# Patient Record
Sex: Male | Born: 1955 | Race: White | Hispanic: No | Marital: Married | Smoking: Former smoker
Health system: Southern US, Community
[De-identification: ages and names within clinical notes are randomized; demographics above are authoritative.]

## PROBLEM LIST (undated history)

## (undated) DIAGNOSIS — R05 Cough: Secondary | ICD-10-CM

## (undated) DIAGNOSIS — G8929 Other chronic pain: Secondary | ICD-10-CM

## (undated) DIAGNOSIS — C61 Malignant neoplasm of prostate: Secondary | ICD-10-CM

## (undated) DIAGNOSIS — C801 Malignant (primary) neoplasm, unspecified: Secondary | ICD-10-CM

## (undated) DIAGNOSIS — E7800 Pure hypercholesterolemia, unspecified: Secondary | ICD-10-CM

## (undated) DIAGNOSIS — R000 Tachycardia, unspecified: Secondary | ICD-10-CM

## (undated) DIAGNOSIS — J111 Influenza due to unidentified influenza virus with other respiratory manifestations: Secondary | ICD-10-CM

## (undated) DIAGNOSIS — N393 Stress incontinence (female) (male): Secondary | ICD-10-CM

## (undated) DIAGNOSIS — J189 Pneumonia, unspecified organism: Secondary | ICD-10-CM

## (undated) HISTORY — PX: PROSTATE SURGERY: SHX751

## (undated) HISTORY — DX: Stress incontinence (female) (male): N39.3

## (undated) HISTORY — DX: Tachycardia, unspecified: R00.0

## (undated) HISTORY — DX: Malignant neoplasm of prostate: C61

## (undated) HISTORY — PX: TONSILLECTOMY: SUR1361

## (undated) HISTORY — DX: Cough: R05

## (undated) HISTORY — PX: ABDOMINAL SURGERY: SHX537

---

## 2014-02-21 DIAGNOSIS — C61 Malignant neoplasm of prostate: Secondary | ICD-10-CM

## 2014-02-21 DIAGNOSIS — Z8546 Personal history of malignant neoplasm of prostate: Secondary | ICD-10-CM | POA: Insufficient documentation

## 2014-02-21 HISTORY — DX: Malignant neoplasm of prostate: C61

## 2014-03-18 DIAGNOSIS — Z9079 Acquired absence of other genital organ(s): Secondary | ICD-10-CM | POA: Insufficient documentation

## 2014-04-10 DIAGNOSIS — N529 Male erectile dysfunction, unspecified: Secondary | ICD-10-CM | POA: Insufficient documentation

## 2014-04-10 DIAGNOSIS — N393 Stress incontinence (female) (male): Secondary | ICD-10-CM

## 2014-04-10 HISTORY — DX: Stress incontinence (female) (male): N39.3

## 2017-09-13 DIAGNOSIS — R000 Tachycardia, unspecified: Secondary | ICD-10-CM

## 2017-09-13 DIAGNOSIS — T887XXA Unspecified adverse effect of drug or medicament, initial encounter: Secondary | ICD-10-CM | POA: Insufficient documentation

## 2017-09-13 HISTORY — DX: Tachycardia, unspecified: R00.0

## 2017-09-19 DIAGNOSIS — R053 Chronic cough: Secondary | ICD-10-CM

## 2017-09-19 DIAGNOSIS — R05 Cough: Secondary | ICD-10-CM | POA: Insufficient documentation

## 2017-09-19 DIAGNOSIS — E782 Mixed hyperlipidemia: Secondary | ICD-10-CM | POA: Insufficient documentation

## 2017-09-19 HISTORY — DX: Chronic cough: R05.3

## 2018-04-05 ENCOUNTER — Encounter: Payer: Self-pay | Admitting: Emergency Medicine

## 2018-04-05 ENCOUNTER — Other Ambulatory Visit: Payer: Self-pay

## 2018-04-05 ENCOUNTER — Emergency Department (INDEPENDENT_AMBULATORY_CARE_PROVIDER_SITE_OTHER)
Admission: EM | Admit: 2018-04-05 | Discharge: 2018-04-05 | Disposition: A | Discharge status: Home / Self Care | Payer: Managed Care, Other (non HMO) | Source: Home / Self Care

## 2018-04-05 DIAGNOSIS — R1033 Periumbilical pain: Secondary | ICD-10-CM

## 2018-04-05 DIAGNOSIS — R0981 Nasal congestion: Secondary | ICD-10-CM

## 2018-04-05 DIAGNOSIS — R197 Diarrhea, unspecified: Secondary | ICD-10-CM | POA: Diagnosis not present

## 2018-04-05 DIAGNOSIS — R1013 Epigastric pain: Secondary | ICD-10-CM | POA: Diagnosis not present

## 2018-04-05 DIAGNOSIS — R1032 Left lower quadrant pain: Secondary | ICD-10-CM | POA: Diagnosis not present

## 2018-04-05 DIAGNOSIS — M545 Low back pain, unspecified: Secondary | ICD-10-CM

## 2018-04-05 HISTORY — DX: Malignant (primary) neoplasm, unspecified: C80.1

## 2018-04-05 HISTORY — DX: Pneumonia, unspecified organism: J18.9

## 2018-04-05 HISTORY — DX: Influenza due to unidentified influenza virus with other respiratory manifestations: J11.1

## 2018-04-05 LAB — POCT CBC W AUTO DIFF (K'VILLE URGENT CARE)

## 2018-04-05 MED ORDER — MELOXICAM 7.5 MG PO TABS: 7.5000 mg | ORAL_TABLET | Freq: Every day | ORAL | 0 refills | Status: DC

## 2018-04-05 MED ORDER — METRONIDAZOLE 500 MG PO TABS: 500.0000 mg | ORAL_TABLET | Freq: Three times a day (TID) | ORAL | 0 refills | Status: DC

## 2018-04-05 MED ORDER — CIPROFLOXACIN HCL 500 MG PO TABS: 500.0000 mg | ORAL_TABLET | Freq: Two times a day (BID) | ORAL | 0 refills | Status: DC

## 2018-04-05 NOTE — ED Triage Notes (Signed)
Patient has had cough for about 6 months; states xrays negative; 2 days ago began having congestion, fatigue, epigastric pain, diarrhea x 1, and raw throat.

## 2018-04-05 NOTE — ED Provider Notes (Signed)
[name]    CSN: [id number] Arrival date & time: 04/05/18  8182     History   Chief Complaint Chief Complaint  Patient presents with  . Nasal Congestion  . Abdominal Pain    HPI Harold Edwards is a 63 y.o. male.   HPI Harold Edwards is a 63 y.o. male presenting to UC with c/o intermittent cough for about 6 months with several negative chest x-rays.  Pt c/o 2 days of cough, congestion, fatigue, abdominal pain and diarrhea that started yesterday.  Abdominal pain is sharp and cramping from epigastric region to left lower abdomen. No prior hx of diverticulitis.  He did have a laparoscopic surgery in 2016/2017 for prostate cancer but also reports having an umbilical hernia repaired around the same time.  Wife and pt wonder if scar tissue could be causing the abdominal pain today.  Denies fever, chills, n/v/d. No hx of diverticulitis.    Past Medical History:  Diagnosis Date  . Cancer ([place])   . Influenza   . Pneumonia     There are no active problems to display for this patient.   Past Surgical History:  Procedure Laterality Date  . ABDOMINAL SURGERY    . PROSTATE SURGERY    . TONSILLECTOMY         Home Medications    Prior to Admission medications   Medication Sig Start Date End Date Taking? Authorizing Provider  ciprofloxacin (CIPRO) 500 MG tablet Take 1 tablet (500 mg total) by mouth 2 (two) times daily. One po bid x 7 days 04/05/18   Noe Gens, PA-C  meloxicam (MOBIC) 7.5 MG tablet Take 1-2 tablets (7.5-15 mg total) by mouth daily. 04/05/18   Noe Gens, PA-C  metroNIDAZOLE (FLAGYL) 500 MG tablet Take 1 tablet (500 mg total) by mouth 3 (three) times daily. 04/05/18   Noe Gens, PA-C    Family History No family history on file.  Social History Social History   Tobacco Use  . Smoking status: Former Research scientist (life sciences)  . Smokeless tobacco: Never Used  Substance Use Topics  . Alcohol use: Yes  . Drug use: Not on file     Allergies   Patient  has no known allergies.   Review of Systems Review of Systems  Constitutional: Positive for fatigue. Negative for chills and fever.  HENT: Positive for congestion and postnasal drip. Negative for ear pain, sore throat, trouble swallowing and voice change.   Respiratory: Positive for cough. Negative for shortness of breath.   Cardiovascular: Negative for chest pain and palpitations.  Gastrointestinal: Positive for abdominal pain and diarrhea. Negative for blood in stool, nausea and vomiting.  Genitourinary: Negative for dysuria, flank pain and hematuria.  Musculoskeletal: Positive for back pain (chronic, lower). Negative for arthralgias and myalgias.  Skin: Negative for rash.  Neurological: Negative for dizziness and headaches.     Physical Exam Triage Vital Signs ED Triage Vitals  Enc Vitals Group     BP 04/05/18 1027 108/75     Pulse Rate 04/05/18 1027 (!) 108     Resp 04/05/18 1027 18     Temp 04/05/18 1027 (!) 97.4 F (36.3 C)     Temp Source 04/05/18 1027 Oral     SpO2 04/05/18 1027 94 %     Weight 04/05/18 1029 210 lb (95.3 kg)     Height 04/05/18 1029 6\' 1"  (1.854 m)     Head Circumference --      Peak Flow --  Pain Score 04/05/18 1029 0     Pain Loc --      Pain Edu? --      Excl. in [place]? --    No data found.  Updated Vital Signs BP 108/75 (BP Location: Right Arm)   Pulse (!) 108   Temp (!) 97.4 F (36.3 C) (Oral)   Resp 18   Ht 6\' 1"  (1.854 m)   Wt 210 lb (95.3 kg)   SpO2 94%   BMI 27.71 kg/m  *SpO2 on RA was 96% checked during exam by provider.   Visual Acuity Right Eye Distance:   Left Eye Distance:   Bilateral Distance:    Right Eye Near:   Left Eye Near:    Bilateral Near:     Physical Exam Vitals signs and nursing note reviewed.  Constitutional:      Appearance: He is well-developed.  HENT:     Head: Normocephalic and atraumatic.     Right Ear: Tympanic membrane normal.     Left Ear: Tympanic membrane normal.     Nose: Congestion  present.     Right Sinus: No maxillary sinus tenderness or frontal sinus tenderness.     Left Sinus: No maxillary sinus tenderness or frontal sinus tenderness.     Mouth/Throat:     Lips: Pink.     Mouth: Mucous membranes are moist.     Pharynx: Oropharynx is clear. Uvula midline.  Neck:     Musculoskeletal: Normal range of motion.  Cardiovascular:     Rate and Rhythm: Normal rate and regular rhythm.  Pulmonary:     Effort: Pulmonary effort is normal.     Breath sounds: Normal breath sounds.  Abdominal:     General: Abdomen is flat. There is no distension.     Palpations: Abdomen is soft.     Tenderness: There is abdominal tenderness in the epigastric area, periumbilical area, suprapubic area and left lower quadrant. There is no right CVA tenderness, left CVA tenderness, guarding or rebound.  Musculoskeletal: Normal range of motion.     Lumbar back: He exhibits tenderness ( bilateral lower lumbar muscles). He exhibits no bony tenderness.  Skin:    General: Skin is warm and dry.  Neurological:     Mental Status: He is alert and oriented to person, place, and time.  Psychiatric:        Behavior: Behavior normal.      UC Treatments / Results  Labs (all labs ordered are listed, but only abnormal results are displayed) Labs Reviewed  COMPLETE METABOLIC PANEL WITH GFR  LIPASE  POCT CBC W AUTO DIFF ([place])    EKG None  Radiology No results found.  Procedures Procedures (including critical care time)  Medications Ordered in UC Medications - No data to display  Initial Impression / Assessment and Plan / UC Course  I have reviewed the triage vital signs and the nursing notes.  Pertinent labs & imaging results that were available during my care of the patient were reviewed by me and considered in my medical decision making (see chart for details).    URI symptoms likely viral, lungs: CTAB, normal TMs no sinus tenderness on exam  Back pain- chronic,  exacerbated by recent cough? Part of viral illness Abd: tenderness to Left side including LLQ of abdomen w/o evidence of acute abdomen Discussed with pt possibility of diverticulitis. Pt declined CT scan Will tx empirically with cipro and flagyl Encouraged f/u with PCP  AVS provided  Final Clinical Impressions(s) / UC Diagnoses   Final diagnoses:  Epigastric pain  Periumbilical pain  LLQ abdominal pain  Diarrhea, unspecified type  Nasal congestion  Acute bilateral low back pain without sciatica     Discharge Instructions      Meloxicam (Mobic) is an antiinflammatory to help with pain and inflammation.  Do not take ibuprofen, Advil, Aleve, or any other medications that contain NSAIDs while taking meloxicam as this may cause stomach upset or even ulcers if taken in large amounts for an extended period of time.   Please take antibiotics as prescribed and be sure to complete entire course even if you start to feel better to ensure infection does not come back.  Please call to schedule an appointment with a primary care provider who can recheck your symptoms. If you do have diverticulitis, it is recommended you have a follow up colonoscopy to ensure no cancerous growths or other complications in your lower intestines.  Call 911 or go to the hospital if symptoms worsening.     ED Prescriptions    Medication Sig Dispense Auth. Provider   meloxicam (MOBIC) 7.5 MG tablet Take 1-2 tablets (7.5-15 mg total) by mouth daily. 14 tablet Gerarda Fraction, Joban Colledge O, PA-C   ciprofloxacin (CIPRO) 500 MG tablet Take 1 tablet (500 mg total) by mouth 2 (two) times daily. One po bid x 7 days 14 tablet Alexcis Bicking O, PA-C   metroNIDAZOLE (FLAGYL) 500 MG tablet Take 1 tablet (500 mg total) by mouth 3 (three) times daily. 14 tablet Noe Gens, [place]     Controlled Substance Prescriptions [place] Controlled Substance Registry consulted? Not Applicable   Tyrell Antonio 04/05/18 1202

## 2018-04-05 NOTE — Discharge Instructions (Signed)
°  Meloxicam (Mobic) is an antiinflammatory to help with pain and inflammation.  Do not take ibuprofen, Advil, Aleve, or any other medications that contain NSAIDs while taking meloxicam as this may cause stomach upset or even ulcers if taken in large amounts for an extended period of time.   Please take antibiotics as prescribed and be sure to complete entire course even if you start to feel better to ensure infection does not come back.  Please call to schedule an appointment with a primary care provider who can recheck your symptoms. If you do have diverticulitis, it is recommended you have a follow up colonoscopy to ensure no cancerous growths or other complications in your lower intestines.  Call 911 or go to the hospital if symptoms worsening.

## 2018-04-14 ENCOUNTER — Encounter: Payer: Self-pay | Admitting: Physician Assistant

## 2018-04-14 ENCOUNTER — Ambulatory Visit (INDEPENDENT_AMBULATORY_CARE_PROVIDER_SITE_OTHER): Payer: Managed Care, Other (non HMO) | Admitting: Physician Assistant

## 2018-04-14 VITALS — BP 134/85 | HR 105 | Ht 73.0 in | Wt 214.0 lb

## 2018-04-14 DIAGNOSIS — E789 Disorder of lipoprotein metabolism, unspecified: Secondary | ICD-10-CM

## 2018-04-14 DIAGNOSIS — R05 Cough: Secondary | ICD-10-CM

## 2018-04-14 DIAGNOSIS — Z7689 Persons encountering health services in other specified circumstances: Secondary | ICD-10-CM

## 2018-04-14 DIAGNOSIS — Z8546 Personal history of malignant neoplasm of prostate: Secondary | ICD-10-CM | POA: Diagnosis not present

## 2018-04-14 DIAGNOSIS — R053 Chronic cough: Secondary | ICD-10-CM

## 2018-04-14 DIAGNOSIS — Z87891 Personal history of nicotine dependence: Secondary | ICD-10-CM

## 2018-04-14 DIAGNOSIS — M5416 Radiculopathy, lumbar region: Secondary | ICD-10-CM

## 2018-04-14 DIAGNOSIS — R000 Tachycardia, unspecified: Secondary | ICD-10-CM

## 2018-04-14 MED ORDER — METOPROLOL SUCCINATE ER 50 MG PO TB24: 50.0000 mg | ORAL_TABLET | Freq: Every day | ORAL | 3 refills | Status: DC

## 2018-04-14 NOTE — Progress Notes (Addendum)
HPI:                                                                Harold Edwards is a 63 y.o. male who presents to [facility] today to establish care   Current Concerns include:   Cough: has had a persistent non-productive cough for approx 8 months. He has been seen 5 times by various family and urgent care providers for this problem. He has been treated with multiple rounds of antibiotics and cough suppressants without improvement of his symptoms. No known triggers. Denies fever, chills, unintended weight loss and nightsweats. Denies history of asthma, wheezing, dyspnea. Endorses nasal congestion. Reports last year he was having allergy symptoms in the spring, but typically doesn't have seasonal allergies. No prior sinus surgery. Former smoker, quit 1990, approx 13 packyears. Personal history of prostate cancer s/p prostatectomy 05/2014 Does not vape He has a remote history of pneumonia He had a CXR 11/09/2017 which showed basilar atelectasis.  Back pain: He has constant bilateral low back pain present for years. 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 Pain is worse with prolonged sitting. He has been having pain and tingling in his right great toe  for a couple of years, but feels like this has been worsening in the last few months. No weakness in his right leg or falls. He had one epidural in July 2018, he had temporary relief for about 1 week and then his symptoms recurred. He doesn't like to take medication, but he will take Meloxicam every one in awhile for severe pain, but it upsets his stomach. He was most recently prescribed Meloxicam 7.5 mg by urgent care on 04/05/18. Prior medications include Tramadol, Diclofenac, Prednisone, and Gabapentin. He did physical therapy in 2014, which was not helpful. He has tried an inversion table and chiropractic care, which was not  helpful Last imaging of the spine was a plain film dated 12/28/12 showing L5-L5 and L5-S1 disc space narrowing. No prior MRI  Sinus tachycardia: reports he has always been "hyper" and very active. Prior PCP documented sinus tachycardia by ECG. He denies chest pain, palpitations or irregular heart rate.  Health Maintenance Health Maintenance  Topic Date Due  . Hepatitis C Screening  19-Nov-1955  . HIV Screening  10/03/1971  . COLONOSCOPY  10/03/2006  . TETANUS/TDAP  04/13/2025  . INFLUENZA VACCINE  Completed    Past Medical History:  Diagnosis Date  . Cancer ([place])   . Chronic cough 09/19/2017  . Influenza   . Pneumonia   . Prostate cancer ([place]) 02/21/2014   4+4=8 s/p TRUS biopsy by Dr. Elnoria Howard on 01/18/14  Last Assessment & Plan:   Patient be managed by urology.  Patient is status post prostatectomy and radiation therapy.  Denies hematuria.  . Sinus tachycardia 09/13/2017  . Stress incontinence 04/10/2014   Past Surgical History:  Procedure Laterality Date  . ABDOMINAL SURGERY    . PROSTATE SURGERY    . TONSILLECTOMY     Social History   Tobacco Use  . Smoking status: Former Smoker    Packs/day: 1.00    Years: 13.00    Pack years: 13.00  Types: Cigarettes    Start date: 02/08/1974    Last attempt to quit: 10/02/1988    Years since quitting: 29.5  . Smokeless tobacco: Never Used  Substance Use Topics  . Alcohol use: Yes   family history includes Breast cancer in an other family member; Diabetes in an other family member.  Review of Systems  HENT: Positive for congestion.   Respiratory: Positive for cough.   Gastrointestinal: Positive for abdominal pain.  Musculoskeletal: Positive for back pain.  Neurological: Positive for weakness (right foot) and headaches (occipital).  All other systems reviewed and are negative.    Medications: Current Outpatient Medications  Medication Sig Dispense Refill  . metoprolol succinate (TOPROL-XL) 50 MG 24 hr tablet Take 1 tablet (50 mg  total) by mouth daily. Take with or immediately following a meal. 90 tablet 3   No current facility-administered medications for this visit.    Allergies  Allergen Reactions  . Amoxicillin Diarrhea    Stomach cramps       Objective:  BP 134/85   Pulse (!) 105   Ht 6\' 1"  (1.854 m)   Wt 214 lb (97.1 kg)   SpO2 98%   BMI 28.23 kg/m  Gen:  alert, not ill-appearing, no distress, appropriate for age 42: head normocephalic without obvious abnormality, conjunctiva and cornea clear, trachea midline Pulm: Normal work of breathing, normal phonation, clear to auscultation bilaterally, no wheezes, rales or rhonchi CV: mildly tachycardic rate, regular rhythm, s1 and s2 distinct, no murmurs, clicks or rubs  Neuro: alert and oriented x 3, no tremor MSK: extremities atraumatic, normal gait and station Skin: intact, no rashes on exposed skin, no jaundice, no cyanosis Psych: well-groomed, cooperative, good eye contact, euthymic mood, affect mood-congruent, speech is articulate, and thought processes clear and goal-directed   No results found for this or any previous visit (from the past 72 hour(s)). No results found.    Assessment and Plan: 63 y.o. 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 Take with or immediately following a meal.  Lumbar back pain with radiculopathy affecting right lower extremity -     MR Lumbar Spine Wo Contrast; Future  Former smoker, stopped smoking in distant past Comments: 1976-1990, 13 packyears    Chronic cough - no  constitutional or red flag symptoms. Pulse ox 98% on RA at rest. No Adventitious lung sounds. Remote smoking history,quit 1990, approx 13 packyears - CXR 11/2017 showing bibasilar atelectasis - DDx includes reactive airway disease, COPD, upper airway cough syndrome, and malignancy - due to smoking hx and personal hx of malignancy (prostate), will order CT chest w/ contrast to rule out malignancy  Sinus tachycardia - asymptomatic, ECG dated 09/19/17 showing sinus tachycardia at 118 bpm (unable to view ECG) - explained importance of treating sinus tach to prevent excess strain on the heart muscle and prevent arrhythmias - starting Metoprolol XR 50 mg  Chronic lumbar radiculopathy - right great toe numbness, gradually worsening. Has failed conservative measures including PT and NSAIDs. No prior advanced imaging. Will  order MR L-spine and refer to Sports Medicine  Patient education and anticipatory guidance given Patient agrees with treatment plan Follow-up in 1 month for HTN/tachycardia or sooner as needed  Darlyne Russian PA-C

## 2018-04-14 NOTE — Patient Instructions (Addendum)
For your blood pressure: - Goal <130/80 (Ideally 120's/70's), Heart rate goal <90 - take your Metoprolol daily at around the same time each day close to a meal - monitor and log blood pressures at home - check around the same time each day in a relaxed setting - Limit salt to <2500 mg/day - Follow DASH (Dietary Approach to Stopping Hypertension) eating plan - Try to get at least 150 minutes of aerobic exercise per week - Aim to go on a brisk walk 30 minutes per day at least 5 days per week. If you're not active, gradually increase how long you walk by 5 minutes each week - limit alcohol: 2 standard drinks per day for men and 1 per day for women - avoid tobacco/nicotine products. Consider smoking cessation if you smoke - weight loss: 7% of current body weight can reduce your blood pressure by 5-10 points - follow-up at least every 6 months for your blood pressure. Follow-up sooner if your BP is not controlled   Sinus Tachycardia  Sinus tachycardia is a kind of fast heartbeat. In sinus tachycardia, the heart beats more than 100 times a minute. Sinus tachycardia starts in a part of the heart called the sinus node. Sinus tachycardia may be harmless, or it may be a sign of a serious condition. What are the causes? This condition may be caused by:  Exercise or exertion.  A fever.  Pain.  Loss of body fluids (dehydration).  Severe bleeding (hemorrhage).  Anxiety and stress.  Certain substances, including: ? Alcohol. ? Caffeine. ? Tobacco and nicotine products. ? Cold medicines. ? Illegal drugs.  Medical conditions including: ? Heart disease. ? An infection. ? An overactive thyroid (hyperthyroidism). ? A lack of red blood cells (anemia). What are the signs or symptoms? Symptoms of this condition include:  A feeling that the heart is beating quickly (palpitations).  Suddenly noticing your heartbeat (cardiac awareness).  Dizziness.  Tiredness (fatigue).  Shortness of  breath.  Chest pain.  Nausea.  Fainting. How is this diagnosed? This condition is diagnosed with:  A physical exam.  Other tests, such as: ? Blood tests. ? An electrocardiogram (ECG). This test measures the electrical activity of the heart. ? Ambulatory cardiac monitor. This records your heartbeats for 24 hours or more. You may be referred to a heart specialist (cardiologist). How is this treated? Treatment for this condition depends on the cause or the underlying condition. Treatment may involve:  Treating the underlying condition.  Taking new medicines or changing your current medicines as told by your health care provider.  Making changes to your diet or lifestyle. Follow these instructions at home: Lifestyle   Do not use any products that contain nicotine or tobacco, such as cigarettes and e-cigarettes. If you need help quitting, ask your health care provider.  Do not use illegal drugs, such as cocaine.  Learn relaxation methods to help you when you get stressed or anxious. These include deep breathing.  Avoid caffeine or other stimulants. Alcohol use   Do not drink alcohol if: ? Your health care provider tells you not to drink. ? You are pregnant, may be pregnant, or are planning to become pregnant.  If you drink alcohol, limit how much you have: ? 0-1 drink a day for women. ? 0-2 drinks a day for men.  Be aware of how much alcohol is in your drink. In the [place] one drink equals one typical bottle of beer (12 oz), one-half glass of wine (5 oz),  or one shot of hard liquor (1 oz). General instructions  Drink enough fluids to keep your urine pale yellow.  Take over-the-counter and prescription medicines only as told by your health care provider.  Keep all follow-up visits as told by your health care provider. This is important. Contact a health care provider if you have:  A fever.  Vomiting or diarrhea that does not go away. Get help right away if  you:  Have pain in your chest, upper arms, jaw, or neck.  Become weak or dizzy.  Feel faint.  Have palpitations that do not go away. Summary  In sinus tachycardia, the heart beats more than 100 times a minute.  Sinus tachycardia may be harmless, or it may be a sign of a serious condition.  Treatment for this condition depends on the cause or the underlying condition.  Get help right away if you have pain in your chest, upper arms, jaw, or neck. This information is not intended to replace advice given to you by your health care provider. Make sure you discuss any questions you have with your health care provider. Document Released: 03/04/2004 Document Revised: 03/16/2017 Document Reviewed: 03/16/2017 Elsevier Interactive Patient Education  2019 [employer].

## 2018-04-20 ENCOUNTER — Telehealth: Payer: Self-pay | Admitting: Physician Assistant

## 2018-04-20 ENCOUNTER — Encounter: Payer: Self-pay | Admitting: Physician Assistant

## 2018-04-20 DIAGNOSIS — Z87891 Personal history of nicotine dependence: Secondary | ICD-10-CM | POA: Insufficient documentation

## 2018-04-20 DIAGNOSIS — R05 Cough: Secondary | ICD-10-CM

## 2018-04-20 DIAGNOSIS — I10 Essential (primary) hypertension: Secondary | ICD-10-CM

## 2018-04-20 DIAGNOSIS — R053 Chronic cough: Secondary | ICD-10-CM

## 2018-04-20 NOTE — Telephone Encounter (Signed)
Regarding denial of CT chest w/o contrast, Case #[id number] Spoke with [employer] nurse clinical reviewer Kelby Aline Order was changed from CT without to CT with contrast Supporting documentation was faxed to [place] [phone]  Please contact patient regarding approval of his CT scan He will need to come in for labs to check his kidney function before receiving IV contrast

## 2018-04-20 NOTE — Telephone Encounter (Signed)
Recommendation left on vm -EH/RMA

## 2018-05-01 ENCOUNTER — Encounter: Payer: Self-pay | Admitting: Family Medicine

## 2018-05-01 ENCOUNTER — Other Ambulatory Visit: Payer: Self-pay

## 2018-05-01 ENCOUNTER — Ambulatory Visit (INDEPENDENT_AMBULATORY_CARE_PROVIDER_SITE_OTHER): Payer: Managed Care, Other (non HMO) | Admitting: Family Medicine

## 2018-05-01 VITALS — BP 144/88 | HR 107 | Temp 97.6°F | Wt 216.0 lb

## 2018-05-01 DIAGNOSIS — M5416 Radiculopathy, lumbar region: Secondary | ICD-10-CM | POA: Diagnosis not present

## 2018-05-01 MED ORDER — GABAPENTIN 300 MG PO CAPS: ORAL_CAPSULE | ORAL | 3 refills | Status: DC

## 2018-05-01 NOTE — Patient Instructions (Addendum)
Thank you for coming in today. Get xray when you can.  Add gabapentin.  Next steps when able will be MRI and epidural steroid injection.  We will be able to do follow up via phone call or remote video likely.  Keep me updated.  Sign up for mychart.   Gabapentin capsules or tablets What is this medicine? GABAPENTIN (GA ba pen tin) is used to control seizures in certain types of epilepsy. It is also used to treat certain types of nerve pain. This medicine may be used for other purposes; ask your health care provider or pharmacist if you have questions. COMMON BRAND NAME(S): Active-PAC with Gabapentin, Gabarone, Neurontin What should I tell my health care provider before I take this medicine? They need to know if you have any of these conditions: -kidney disease -suicidal thoughts, plans, or attempt; a previous suicide attempt by you or a family member -an unusual or allergic reaction to gabapentin, other medicines, foods, dyes, or preservatives -pregnant or trying to get pregnant -breast-feeding How should I use this medicine? Take this medicine by mouth with a glass of water. Follow the directions on the prescription label. You can take it with or without food. If it upsets your stomach, take it with food. Take your medicine at regular intervals. Do not take it more often than directed. Do not stop taking except on your doctor's advice. If you are directed to break the 600 or 800 mg tablets in half as part of your dose, the extra half tablet should be used for the next dose. If you have not used the extra half tablet within 28 days, it should be thrown away. A special MedGuide will be given to you by the pharmacist with each prescription and refill. Be sure to read this information carefully each time. Talk to your pediatrician regarding the use of this medicine in children. While this drug may be prescribed for children as young as 3 years for selected conditions, precautions do  apply. Overdosage: If you think you have taken too much of this medicine contact a poison control center or emergency room at once. NOTE: This medicine is only for you. Do not share this medicine with others. What if I miss a dose? If you miss a dose, take it as soon as you can. If it is almost time for your next dose, take only that dose. Do not take double or extra doses. What may interact with this medicine? Do not take this medicine with any of the following medications: -other gabapentin products This medicine may also interact with the following medications: -alcohol -antacids -antihistamines for allergy, cough and cold -certain medicines for anxiety or sleep -certain medicines for depression or psychotic disturbances -homatropine; hydrocodone -naproxen -narcotic medicines (opiates) for pain -phenothiazines like chlorpromazine, mesoridazine, prochlorperazine, thioridazine This list may not describe all possible interactions. Give your health care provider a list of all the medicines, herbs, non-prescription drugs, or dietary supplements you use. Also tell them if you smoke, drink alcohol, or use illegal drugs. Some items may interact with your medicine. What should I watch for while using this medicine? Visit your doctor or health care professional for regular checks on your progress. You may want to keep a record at home of how you feel your condition is responding to treatment. You may want to share this information with your doctor or health care professional at each visit. You should contact your doctor or health care professional if your seizures get worse or if  you have any new types of seizures. Do not stop taking this medicine or any of your seizure medicines unless instructed by your doctor or health care professional. Stopping your medicine suddenly can increase your seizures or their severity. Wear a medical identification bracelet or chain if you are taking this medicine for  seizures, and carry a card that lists all your medications. You may get drowsy, dizzy, or have blurred vision. Do not drive, use machinery, or do anything that needs mental alertness until you know how this medicine affects you. To reduce dizzy or fainting spells, do not sit or stand up quickly, especially if you are an older patient. Alcohol can increase drowsiness and dizziness. Avoid alcoholic drinks. Your mouth may get dry. Chewing sugarless gum or sucking hard candy, and drinking plenty of water will help. The use of this medicine may increase the chance of suicidal thoughts or actions. Pay special attention to how you are responding while on this medicine. Any worsening of mood, or thoughts of suicide or dying should be reported to your health care professional right away. Women who become pregnant while using this medicine may enroll in the [place] Pregnancy Registry by calling [phone]. This registry collects information about the safety of antiepileptic drug use during pregnancy. What side effects may I notice from receiving this medicine? Side effects that you should report to your doctor or health care professional as soon as possible: -allergic reactions like skin rash, itching or hives, swelling of the face, lips, or tongue -worsening of mood, thoughts or actions of suicide or dying Side effects that usually do not require medical attention (report to your doctor or health care professional if they continue or are bothersome): -constipation -difficulty walking or controlling muscle movements -dizziness -nausea -slurred speech -tiredness -tremors -weight gain This list may not describe all possible side effects. Call your doctor for medical advice about side effects. You may report side effects to FDA at 1-800-FDA-1088. Where should I keep my medicine? Keep out of reach of children. This medicine may cause accidental overdose and death if it taken by  other adults, children, or pets. Mix any unused medicine with a substance like cat litter or coffee grounds. Then throw the medicine away in a sealed container like a sealed bag or a coffee can with a lid. Do not use the medicine after the expiration date. Store at room temperature between 15 and 30 degrees C (59 and 86 degrees F). NOTE: This sheet is a summary. It may not cover all possible information. If you have questions about this medicine, talk to your doctor, pharmacist, or health care provider.  2019 Elsevier/Gold Standard (2017-06-30 13:21:44)  Epidural Steroid Injection Patient Information  Description: The epidural space surrounds the nerves as they exit the spinal cord.  In some patients, the nerves can be compressed and inflamed by a bulging disc or a tight spinal canal (spinal stenosis).  By injecting steroids into the epidural space, we can bring irritated nerves into direct contact with a potentially helpful medication.  These steroids act directly on the irritated nerves and can reduce swelling and inflammation which often leads to decreased pain.  Epidural steroids may be injected anywhere along the spine and from the neck to the low back depending upon the location of your pain.   After numbing the skin with local anesthetic (like Novocaine), a small needle is passed into the epidural space slowly.  You may experience a sensation of pressure while this  is being done.  The entire block usually last less than 10 minutes.  Conditions which may be treated by epidural steroids:   Low back and leg pain  Neck and arm pain  Spinal stenosis  Post-laminectomy syndrome  Herpes zoster (shingles) pain  Pain from compression fractures  Preparation for the injection:  1. Do not eat any solid food or dairy products within 8 hours of your appointment.  2. You may drink clear liquids up to 3 hours before appointment.  Clear liquids include water, black coffee, juice or soda.  No milk  or cream please. 3. You may take your regular medication, including pain medications, with a sip of water before your appointment  Diabetics should hold regular insulin (if taken separately) and take 1/2 normal NPH dos the morning of the procedure.  Carry some sugar containing items with you to your appointment. 4. A driver must accompany you and be prepared to drive you home after your procedure.  5. Bring all your current medications with your. 6. An IV may be inserted and sedation may be given at the discretion of the physician.   7. A blood pressure cuff, EKG and other monitors will often be applied during the procedure.  Some patients may need to have extra oxygen administered for a short period. 8. You will be asked to provide medical information, including your allergies, prior to the procedure.  We must know immediately if you are taking blood thinners (like Coumadin/Warfarin)  Or if you are allergic to IV iodine contrast (dye). We must know if you could possible be pregnant.  Possible side-effects:  Bleeding from needle site  Infection (rare, may require surgery)  Nerve injury (rare)  Numbness & tingling (temporary)  Difficulty urinating (rare, temporary)  Spinal headache ( a headache worse with upright posture)  Light -headedness (temporary)  Pain at injection site (several days)  Decreased blood pressure (temporary)  Weakness in arm/leg (temporary)  Pressure sensation in back/neck (temporary)  Call if you experience:  Fever/chills associated with headache or increased back/neck pain.  Headache worsened by an upright position.  New onset weakness or numbness of an extremity below the injection site  Hives or difficulty breathing (go to the emergency room)  Inflammation or drainage at the infection site  Severe back/neck pain  Any new symptoms which are concerning to you  Please note:  Although the local anesthetic injected can often make your back or neck  feel good for several hours after the injection, the pain will likely return.  It takes 3-7 days for steroids to work in the epidural space.  You may not notice any pain relief for at least that one week.  If effective, we will often do a series of three injections spaced 3-6 weeks apart to maximally decrease your pain.  After the initial series, we generally will wait several months before considering a repeat injection of the same type.

## 2018-05-01 NOTE — Progress Notes (Signed)
Subjective:    I'm seeing this patient as a consultation for:  Harold Dredge, PA-C   CC: Back pain with radiculopathy  HPI:  Harold Edwards history of right-sided low back pain radiating to the right leg.  He notes pain into the lateral calf dorsal foot right toe.  He notes recently the pain has worsened a bit and he is having numbness tingling and pain into the heel as well.  Pain is worse with prolonged sitting and better with extension and standing.  He denies any new weakness bowel bladder dysfunction or new pain.  He is had treatment trials in the past including various NSAIDs including meloxicam ibuprofen Aleve and diclofenac.  Additionally he has had trials of short doses of oral steroids.  He thinks he may have had an epidural steroid injection in the past which helped as well.  He notes this been sometime since he is had diagnostic work-up and significant treatment.  He thinks his last MRI was over 5 years ago.  He notes a pertinent past medical history for prostate cancer requiring robot prostatectomy and external beam radiation in 2015 and 16.  His PSA has remained low but has been cancer free since.  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  Past medical history, Surgical history, Family history not pertinant except as noted below, Social history, Allergies, and medications have been entered into the medical record, reviewed, and no changes needed.   Review of Systems: No headache, visual changes, nausea, vomiting, diarrhea, constipation, dizziness, abdominal pain, skin rash, fevers, chills, night sweats, weight loss, swollen lymph nodes, body aches, joint swelling, muscle aches, chest pain, shortness of breath, mood changes, visual or auditory hallucinations.   Objective:    Vitals:   05/01/18 0713  BP: (!) 144/88  Pulse: (!) 107  Temp: 97.6 F (36.4 C)   General: Well Developed, well nourished, and in no acute distress.   Neuro/Psych: Alert and oriented x3, extra-ocular muscles intact, able to move all 4 extremities, sensation grossly intact. Skin: Warm and dry, no rashes noted.  Respiratory: Not using accessory muscles, speaking in full sentences, trachea midline.  Cardiovascular: Pulses palpable, no extremity edema. Abdomen: Does not appear distended. MSK:  L-spine nontender to spinal midline normal back motion. Positive right-sided slump test. Reflexes are equal and normal bilaterally. Strength is intact bilateral lower extremities with exception of right foot dorsiflexion and right great toe dorsiflexion is very slightly limited 4+/5. Normal gait.  Lab and Radiology Results X-ray L-spine images ordered and pending.  Patient will get x-ray at his convenience today or tomorrow if possible.  Impression and Recommendations:    Assessment and Plan: 63 y.o. male with  Right L5 radiculopathy and low back pain.  Likely bulging disc compressing L5 nerve root.  X-ray pending.   Ideally the next step would be physical therapy and MRI for epidural steroid injection planning.  However due to COVID-19 pandemic our ability to do this as an elective basis is limited.  I do not think right now he meets emergency criteria to get these tests and procedures done.  However if his weakness worsens that would be a signal to proceed with further emergency testing and treatment.  In the meantime we will proceed with trial of gabapentin starting at bedtime as well as home exercise program.  We will recheck in about a month via phone call or in person for my chart message with how he  is doing.  Will progress to MRI when able.   Orders Placed This Encounter  Procedures  . DG Lumbar Spine Complete    Standing Status:   Future    Standing Expiration Date:   07/01/2019    Order Specific Question:   Reason for Exam (SYMPTOM  OR DIAGNOSIS REQUIRED)    Answer:   eval right L5 radicuopathy    Order Specific Question:   Preferred  imaging location?    Answer:   [facility]    Order Specific Question:   Radiology Contrast Protocol - do NOT remove file path    Answer:   \\[email or web address]   Meds ordered this encounter  Medications  . gabapentin (NEURONTIN) 300 MG capsule    Sig: One tab PO qHS for a week, then BID for a week, then TID. May double weekly to a max of 3,600mg /day    Dispense:  180 capsule    Refill:  3    Discussed warning signs or symptoms. Please see discharge instructions. Patient expresses understanding.

## 2018-05-16 ENCOUNTER — Ambulatory Visit: Payer: Managed Care, Other (non HMO) | Admitting: Physician Assistant

## 2018-08-22 ENCOUNTER — Ambulatory Visit (INDEPENDENT_AMBULATORY_CARE_PROVIDER_SITE_OTHER): Payer: Managed Care, Other (non HMO)

## 2018-08-22 ENCOUNTER — Encounter: Payer: Self-pay | Admitting: Family Medicine

## 2018-08-22 ENCOUNTER — Other Ambulatory Visit: Payer: Self-pay

## 2018-08-22 ENCOUNTER — Ambulatory Visit (INDEPENDENT_AMBULATORY_CARE_PROVIDER_SITE_OTHER): Payer: Managed Care, Other (non HMO) | Admitting: Family Medicine

## 2018-08-22 VITALS — BP 131/81 | HR 105 | Temp 97.7°F | Wt 213.0 lb

## 2018-08-22 DIAGNOSIS — M5416 Radiculopathy, lumbar region: Secondary | ICD-10-CM | POA: Diagnosis not present

## 2018-08-22 MED ORDER — TRAMADOL HCL 50 MG PO TABS: 50.0000 mg | ORAL_TABLET | Freq: Three times a day (TID) | ORAL | 0 refills | Status: DC | PRN

## 2018-08-22 NOTE — Progress Notes (Signed)
Harold Edwards is a 63 y.o. male who presents to [facility] today for follow-up of lumbago and radiculopathy.  Patient was seen on March 23 for back pain radiating to right leg likely L5 distribution.  We proceeded with trial of gabapentin.  MRI was delayed due to COVID-19 and patient was somewhat lost to follow-up.  In the interim he notes continued pain in his left low back and right leg.  He notes it can be quite severe at times.  It is worse with activity and better with rest.  He also notes worsening pain when he sits in a flexed position.  He notes gabapentin has not been helpful.  He also has tried oral diclofenac which has not been helpful.  He had limited tramadol previously which helps a little.  He takes it very infrequently.  He notes in the past he had what he describes as a trigger point injection in his paraspinal musculature which he found to be helpful in the past.    ROS:  As above  Exam:  BP 131/81   Pulse (!) 105   Temp 97.7 F (36.5 C) (Oral)   Wt 213 lb (96.6 kg)   BMI 28.10 kg/m  Wt Readings from Last 5 Encounters:  08/22/18 213 lb (96.6 kg)  05/01/18 216 lb (98 kg)  04/14/18 214 lb (97.1 kg)  04/05/18 210 lb (95.3 kg)   General: Well Developed, well nourished, and in no acute distress.  Neuro/Psych: Alert and oriented x3, extra-ocular muscles intact, able to move all 4 extremities, sensation grossly intact. Skin: Warm and dry, no rashes noted.  Respiratory: Not using accessory muscles, speaking in full sentences, trachea midline.  Cardiovascular: Pulses palpable, no extremity edema. Abdomen: Does not appear distended. MSK:  L-spine: Nontender to midline.  Tender palpation left lumbar paraspinal musculature. Decreased lumbar motion. Lower extremity strength reflexes and sensation are equal normal throughout. Positive slump test right side. Normal gait.    Lab and Radiology Results No results found for this or  any previous visit (from the past 72 hour(s)). Dg Lumbar Spine Complete  Result Date: 08/22/2018 CLINICAL DATA:  Chronic low back pain EXAM: LUMBAR SPINE - COMPLETE 4+ VIEW COMPARISON:  None. FINDINGS: Five lumbar type vertebral bodies are well visualized. Vertebral body height is well maintained. No pars defects are seen. Disc space narrowing at L4-5 and L5-S1 is seen. Multilevel osteophytic changes are noted. No soft tissue abnormality is seen. No anterolisthesis is noted. IMPRESSION: Degenerative change without acute abnormality. Electronically Signed   By: Inez Catalina M.D.   On: 08/22/2018 08:37  I personally (independently) visualized and performed the interpretation of the images attached in this note.  Trigger point injection: Left lumbar paraspinal musculature. Consent obtained and timeout performed.  Discussed risks and benefits. Area of maximal tenderness identified in the left lumbar paraspinal musculature. Skin cleaned with rubbing alcohol and chlorhexidine. Cold spray applied and 1 mL of lidocaine and 40 mg of Kenalog injected in a fan pattern. Patient had symptom improvement following injection.    Assessment and Plan: 63 y.o. male with now approaching chronic low back pain with chronic right radiculopathy.  Patient does have significant degenerative changes on x-ray.  Additionally he describes right L5 radicular symptoms.  Plan for trigger point injection as above.  Additionally will obtain MRI for potential epidural steroid or facet injection planning.  Limited tramadol for pain control.  We will schedule video visit after MRI to discuss results  in further detail and likely proceed with epidural steroid injection or facet injection.  Reviewed these procedures with patient as well.   PDMP reviewed during this encounter. Orders Placed This Encounter  Procedures  . DG Lumbar Spine Complete    Standing Status:   Future    Number of Occurrences:   1    Standing Expiration Date:    10/23/2019    Order Specific Question:   Reason for Exam (SYMPTOM  OR DIAGNOSIS REQUIRED)    Answer:   Evaluate low back pain with right-sided L5 radiculopathy symptoms.    Order Specific Question:   Preferred imaging location?    Answer:   [facility]    Order Specific Question:   Radiology Contrast Protocol - do NOT remove file path    Answer:   \\[email or web address]  . MR Lumbar Spine Wo Contrast    Standing Status:   Future    Standing Expiration Date:   10/23/2019    Order Specific Question:   What is the patient's sedation requirement?    Answer:   No Sedation    Order Specific Question:   Does the patient have a pacemaker or implanted devices?    Answer:   No    Order Specific Question:   Preferred imaging location?    Answer:   [facility] (table limit-350lbs)    Order Specific Question:   Radiology Contrast Protocol - do NOT remove file path    Answer:   \\[email or web address]\[email or web address]   Meds ordered this encounter  Medications  . traMADol (ULTRAM) 50 MG tablet    Sig: Take 1 tablet (50 mg total) by mouth every 8 (eight) hours as needed for severe pain.    Dispense:  15 tablet    Refill:  0    Historical information moved to improve visibility of documentation.  Past Medical History:  Diagnosis Date  . Cancer ([place])   . Chronic cough 09/19/2017  . Influenza   . Pneumonia   . Prostate cancer ([place]) 02/21/2014   4+4=8 s/p TRUS biopsy by Dr. Elnoria Howard on 01/18/14  Last Assessment & Plan:   Patient be managed by urology.  Patient is status post prostatectomy and radiation therapy.  Denies hematuria.  . Sinus tachycardia 09/13/2017  . Stress incontinence 04/10/2014   Past Surgical History:  Procedure Laterality Date  . ABDOMINAL SURGERY    . PROSTATE SURGERY     Prostatectomy  . TONSILLECTOMY     Social History   Tobacco Use  . Smoking status: Former Smoker    Packs/day: 1.00    Years: 13.00    Pack  years: 13.00    Types: Cigarettes    Start date: 02/08/1974    Quit date: 10/02/1988    Years since quitting: 29.9  . Smokeless tobacco: Never Used  Substance Use Topics  . Alcohol use: Yes   family history includes Breast cancer in an other family member; Diabetes in an other family member.  Medications: Current Outpatient Medications  Medication Sig Dispense Refill  . gabapentin (NEURONTIN) 300 MG capsule One tab PO qHS for a week, then BID for a week, then TID. May double weekly to a max of 3,600mg /day (Patient not taking: Reported on 08/22/2018) 180 capsule 3  . metoprolol succinate (TOPROL-XL) 50 MG 24 hr tablet Take 1 tablet (50 mg total) by mouth daily. Take with or immediately following a meal. (Patient not taking: Reported on 08/22/2018) 90 tablet 3  . traMADol (ULTRAM) 50 MG tablet Take  1 tablet (50 mg total) by mouth every 8 (eight) hours as needed for severe pain. 15 tablet 0   No current facility-administered medications for this visit.    Allergies  Allergen Reactions  . Amoxicillin Diarrhea    Stomach cramps      Discussed warning signs or symptoms. Please see discharge instructions. Patient expresses understanding.

## 2018-08-22 NOTE — Patient Instructions (Signed)
Thank you for coming in today. Get MRI.  We will likely schedule video follow up after MRI and likely will do an injection.  Call or go to the ER if you develop a large red swollen joint with extreme pain or oozing puss.   Use tramadol sparingly.   Keep me updated.    Radicular Pain Radicular pain is a type of pain that spreads from your back or neck along a spinal nerve. Spinal nerves are nerves that leave the spinal cord and go to the muscles. Radicular pain is sometimes called radiculopathy, radiculitis, or a pinched nerve. When you have this type of pain, you may also have weakness, numbness, or tingling in the area of your body that is supplied by the nerve. The pain may feel sharp and burning. Depending on which spinal nerve is affected, the pain may occur in the:  Neck area (cervical radicular pain). You may also feel pain, numbness, weakness, or tingling in the arms.  Mid-spine area (thoracic radicular pain). You would feel this pain in the back and chest. This type is rare.  Lower back area (lumbar radicular pain). You would feel this pain as low back pain. You may feel pain, numbness, weakness, or tingling in the buttocks or legs. Sciatica is a type of lumbar radicular pain that shoots down the back of the leg. Radicular pain occurs when one of the spinal nerves becomes irritated or squeezed (compressed). It is often caused by something pushing on a spinal nerve, such as one of the bones of the spine (vertebrae) or one of the round cushions between vertebrae (intervertebral disks). This can result from:  An injury.  Wear and tear or aging of a disk.  The growth of a bone spur that pushes on the nerve. Radicular pain often goes away when you follow instructions from your health care provider for relieving pain at home. Follow these instructions at home: Managing pain      If directed, put ice on the affected area: ? Put ice in a plastic bag. ? Place a towel between your  skin and the bag. ? Leave the ice on for 20 minutes, 2-3 times a day.  If directed, apply heat to the affected area as often as told by your health care provider. Use the heat source that your health care provider recommends, such as a moist heat pack or a heating pad. ? Place a towel between your skin and the heat source. ? Leave the heat on for 20-30 minutes. ? Remove the heat if your skin turns bright red. This is especially important if you are unable to feel pain, heat, or cold. You may have a greater risk of getting burned. Activity   Do not sit or rest in bed for long periods of time.  Try to stay as active as possible. Ask your health care provider what type of exercise or activity is best for you.  Avoid activities that make your pain worse, such as bending and lifting.  Do not lift anything that is heavier than 10 lb (4.5 kg), or the limit that you are told, until your health care provider says that it is safe.  Practice using proper technique when lifting items. Proper lifting technique involves bending your knees and rising up.  Do strength and range-of-motion exercises only as told by your health care provider or physical therapist. General instructions  Take over-the-counter and prescription medicines only as told by your health care provider.  Pay attention  to any changes in your symptoms.  Keep all follow-up visits as told by your health care provider. This is important. ? Your health care provider may send you to a physical therapist to help with this pain. Contact a health care provider if:  Your pain and other symptoms get worse.  Your pain medicine is not helping.  Your pain has not improved after a few weeks of home care.  You have a fever. Get help right away if:  You have severe pain, weakness, or numbness.  You have difficulty with bladder or bowel control. Summary  Radicular pain is a type of pain that spreads from your back or neck along a spinal  nerve.  When you have radicular pain, you may also have weakness, numbness, or tingling in the area of your body that is supplied by the nerve.  The pain may feel sharp or burning.  Radicular pain may be treated with ice, heat, medicines, or physical therapy. This information is not intended to replace advice given to you by your health care provider. Make sure you discuss any questions you have with your health care provider. Document Released: 03/04/2004 Document Revised: 08/09/2017 Document Reviewed: 08/09/2017 Elsevier Patient Education  2020 [employer].

## 2018-08-27 ENCOUNTER — Other Ambulatory Visit: Payer: Self-pay

## 2018-08-27 ENCOUNTER — Ambulatory Visit (INDEPENDENT_AMBULATORY_CARE_PROVIDER_SITE_OTHER): Payer: Managed Care, Other (non HMO)

## 2018-08-27 DIAGNOSIS — M5416 Radiculopathy, lumbar region: Secondary | ICD-10-CM

## 2018-08-31 ENCOUNTER — Encounter: Payer: Self-pay | Admitting: Family Medicine

## 2018-09-04 ENCOUNTER — Telehealth: Payer: Self-pay | Admitting: Family Medicine

## 2018-09-04 DIAGNOSIS — M5416 Radiculopathy, lumbar region: Secondary | ICD-10-CM

## 2018-09-04 NOTE — Telephone Encounter (Signed)
Lumbar epidural steroid injection ordered in response to his lumbar MRI.  Plan for video visit this week if possible to discuss results and next steps

## 2018-09-05 NOTE — Telephone Encounter (Signed)
Left VM for Harold Edwards, also sent pt MyChart msg advising of what was ordered and to make virtual appt

## 2018-09-06 ENCOUNTER — Telehealth: Payer: Self-pay | Admitting: Family Medicine

## 2018-09-06 NOTE — Telephone Encounter (Signed)
Appointment has been made and I left patient a voicemail that it is for a virtual on Tuesday august 4th at 7:30am.

## 2018-09-06 NOTE — Telephone Encounter (Signed)
I will ask 1 of my office staff to schedule a video visit with you at 7:30 in the morning on Tuesday, Wednesday, or Thursday, or Friday of next week.  It is okay for them to modify my schedule so that there is an appointment at that time.  Please let me know which date this is scheduled for so I can be sure to be in the clinic in the morning to be available for that visit.

## 2018-09-11 ENCOUNTER — Ambulatory Visit: Payer: Managed Care, Other (non HMO) | Admitting: Family Medicine

## 2018-09-12 ENCOUNTER — Ambulatory Visit (INDEPENDENT_AMBULATORY_CARE_PROVIDER_SITE_OTHER): Payer: Managed Care, Other (non HMO) | Admitting: Family Medicine

## 2018-09-12 DIAGNOSIS — M5416 Radiculopathy, lumbar region: Secondary | ICD-10-CM

## 2018-09-12 NOTE — Progress Notes (Signed)
Virtual Visit  I connected with      Harold Edwards  by a telemedicine application and verified that I am speaking with the correct person using two identifiers.   I discussed the limitations of evaluation and management by telemedicine and the availability of in person appointments. The patient expressed understanding and agreed to proceed.  History of Present Illness: Harold Edwards is a 63 y.o. male who would like to discuss MRI findings and next steps.  Patient was seen on July 14 for back and leg pain but to be lumbar radicular pain.  X-ray at the time was significant for mild degenerative disease.  Subsequent MRI did show bulging disc likely affecting bilateral L5 nerve root and right L3 nerve root.  His symptoms were most dominant at right L5 nerve root.  At that time he had failed typical conservative management and plan for epidural steroid injection.  Epidural steroid injection is scheduled for August 6th.    Observations/Objective: There were no vitals taken for this visit. Wt Readings from Last 5 Encounters:  08/22/18 213 lb (96.6 kg)  05/01/18 216 lb (98 kg)  04/14/18 214 lb (97.1 kg)  04/05/18 210 lb (95.3 kg)   Exam: Normal Speech.    Lab and Radiology Results Dg Lumbar Spine Complete  Result Date: 08/22/2018 CLINICAL DATA:  Chronic low back pain EXAM: LUMBAR SPINE - COMPLETE 4+ VIEW COMPARISON:  None. FINDINGS: Five lumbar type vertebral bodies are well visualized. Vertebral body height is well maintained. No pars defects are seen. Disc space narrowing at L4-5 and L5-S1 is seen. Multilevel osteophytic changes are noted. No soft tissue abnormality is seen. No anterolisthesis is noted. IMPRESSION: Degenerative change without acute abnormality. Electronically Signed   By: Inez Catalina M.D.   On: 08/22/2018 08:37   Mr Lumbar Spine Wo Contrast  Result Date: 08/27/2018 CLINICAL DATA:  Chronic low back pain and right leg pain, worsening recently. EXAM: MRI LUMBAR SPINE  WITHOUT CONTRAST TECHNIQUE: Multiplanar, multisequence MR imaging of the lumbar spine was performed. No intravenous contrast was administered. COMPARISON:  Radiography 08/22/2018 FINDINGS: Segmentation:  5 lumbar type vertebral bodies. Alignment:  Normal Vertebrae:  No fracture or primary bone lesion. Conus medullaris and cauda equina: Conus extends to the L1 level. Conus and cauda equina appear normal. Paraspinal and other soft tissues: Negative Disc levels: T12-L1, L1-2 and L2-3: Minimal, non-compressive disc bulges. L3-4: Mild bulging of the disc with slight focal prominence in the right lateral foraminal to extraforaminal region. No apparent neural compression however. Some potential the right L3 nerve could be irritated. L4-5: Circumferential bulging of the disc. Mild facet and ligamentous hypertrophy. Stenosis of both lateral recesses that could affect either L5 nerve. Mild bilateral foraminal narrowing. L5-S1: Chronic disc degeneration with endplate osteophytes and bulging of the disc more prominent towards the left. Mild narrowing of the subarticular lateral recess and intervertebral foramen on the left but without visible neural compression. No right-sided stenosis. IMPRESSION: Chronic lower lumbar degenerative changes without evidence of acute finding. L3-4: Circumferential bulging of the disc, focally prominent in the right lateral foraminal to extraforaminal region. This is adjacent to the right L3 nerve. Definite nerve compression is not seen, but the right L3 nerve could be irritated. L4-5: Circumferential bulging of the disc. Mild facet hypertrophy. Stenosis of both lateral recesses that would have potential to affect either or both L5 nerves. Mild bilateral foraminal narrowing as well. L5-S1: Left posterolateral predominant osteophyte and bulging disc. Mild stenosis of the subarticular  lateral recess and intervertebral foramen on the left, but no definite neural compression. Electronically Signed    By: Nelson Chimes M.D.   On: 08/27/2018 09:55   I personally (independently) visualized and performed the interpretation of the images attached in this note.   Assessment and Plan: 63 y.o. male with lumbar radiculopathy.  Discussed MRI findings.  Agree with plan epidural steroid injection.  Discussed expected outcome and possible neck steps.  Patient expresses understanding and agreement.  Recheck as needed.  Patient will let me know how he is feeling a few days to a week after the epidural steroid injection.  PDMP not reviewed this encounter. No orders of the defined types were placed in this encounter.  No orders of the defined types were placed in this encounter.   Follow Up Instructions:    I discussed the assessment and treatment plan with the patient. The patient was provided an opportunity to ask questions and all were answered. The patient agreed with the plan and demonstrated an understanding of the instructions.   The patient was advised to call back or seek an in-person evaluation if the symptoms worsen or if the condition fails to improve as anticipated.  Time: 15 minutes of intraservice time, with >22 minutes of total time during today's visit.      Historical information moved to improve visibility of documentation.  Past Medical History:  Diagnosis Date  . Cancer ([place])   . Chronic cough 09/19/2017  . Influenza   . Pneumonia   . Prostate cancer ([place]) 02/21/2014   4+4=8 s/p TRUS biopsy by Dr. Elnoria Howard on 01/18/14  Last Assessment & Plan:   Patient be managed by urology.  Patient is status post prostatectomy and radiation therapy.  Denies hematuria.  . Sinus tachycardia 09/13/2017  . Stress incontinence 04/10/2014   Past Surgical History:  Procedure Laterality Date  . ABDOMINAL SURGERY    . PROSTATE SURGERY     Prostatectomy  . TONSILLECTOMY     Social History   Tobacco Use  . Smoking status: Former Smoker    Packs/day: 1.00    Years: 13.00    Pack years: 13.00     Types: Cigarettes    Start date: 02/08/1974    Quit date: 10/02/1988    Years since quitting: 29.9  . Smokeless tobacco: Never Used  Substance Use Topics  . Alcohol use: Yes   family history includes Breast cancer in an other family member; Diabetes in an other family member.  Medications: Current Outpatient Medications  Medication Sig Dispense Refill  . gabapentin (NEURONTIN) 300 MG capsule One tab PO qHS for a week, then BID for a week, then TID. May double weekly to a max of 3,600mg /day (Patient not taking: Reported on 08/22/2018) 180 capsule 3  . metoprolol succinate (TOPROL-XL) 50 MG 24 hr tablet Take 1 tablet (50 mg total) by mouth daily. Take with or immediately following a meal. (Patient not taking: Reported on 08/22/2018) 90 tablet 3  . traMADol (ULTRAM) 50 MG tablet Take 1 tablet (50 mg total) by mouth every 8 (eight) hours as needed for severe pain. 15 tablet 0   No current facility-administered medications for this visit.    Allergies  Allergen Reactions  . Amoxicillin Diarrhea    Stomach cramps

## 2018-09-14 ENCOUNTER — Ambulatory Visit
Admission: RE | Admit: 2018-09-14 | Discharge: 2018-09-14 | Disposition: A | Discharge status: Home / Self Care | Payer: Managed Care, Other (non HMO) | Source: Ambulatory Visit | Attending: Family Medicine | Admitting: Family Medicine

## 2018-09-14 ENCOUNTER — Other Ambulatory Visit: Payer: Self-pay

## 2018-09-14 MED ORDER — IOPAMIDOL (ISOVUE-M 200) INJECTION 41%
1.0000 mL | Freq: Once | INTRAMUSCULAR | Status: AC
  Administered 2018-09-14: 1 mL via EPIDURAL

## 2018-09-14 MED ORDER — METHYLPREDNISOLONE ACETATE 40 MG/ML INJ SUSP (RADIOLOG
120.0000 mg | Freq: Once | INTRAMUSCULAR | Status: AC
  Administered 2018-09-14: 120 mg via EPIDURAL

## 2018-09-14 NOTE — Discharge Instructions (Signed)

## 2018-09-21 ENCOUNTER — Telehealth: Payer: Self-pay | Admitting: Physician Assistant

## 2018-09-21 ENCOUNTER — Encounter: Payer: Self-pay | Admitting: Family Medicine

## 2018-09-21 NOTE — Telephone Encounter (Signed)
Appointment has been made with requested provider. No further questions at this time.

## 2018-09-26 ENCOUNTER — Encounter: Payer: Self-pay | Admitting: Family Medicine

## 2018-09-26 ENCOUNTER — Ambulatory Visit (INDEPENDENT_AMBULATORY_CARE_PROVIDER_SITE_OTHER): Payer: Managed Care, Other (non HMO) | Admitting: Family Medicine

## 2018-09-26 ENCOUNTER — Other Ambulatory Visit: Payer: Self-pay

## 2018-09-26 VITALS — BP 139/86 | HR 116 | Temp 98.0°F | Wt 208.0 lb

## 2018-09-26 DIAGNOSIS — Z8546 Personal history of malignant neoplasm of prostate: Secondary | ICD-10-CM | POA: Diagnosis not present

## 2018-09-26 DIAGNOSIS — M5416 Radiculopathy, lumbar region: Secondary | ICD-10-CM | POA: Diagnosis not present

## 2018-09-26 DIAGNOSIS — N393 Stress incontinence (female) (male): Secondary | ICD-10-CM

## 2018-09-26 DIAGNOSIS — E782 Mixed hyperlipidemia: Secondary | ICD-10-CM

## 2018-09-26 DIAGNOSIS — Z1159 Encounter for screening for other viral diseases: Secondary | ICD-10-CM

## 2018-09-26 DIAGNOSIS — Z9079 Acquired absence of other genital organ(s): Secondary | ICD-10-CM | POA: Diagnosis not present

## 2018-09-26 MED ORDER — OXYBUTYNIN CHLORIDE 5 MG PO TABS: 5.0000 mg | ORAL_TABLET | Freq: Every evening | ORAL | 3 refills | Status: DC | PRN

## 2018-09-26 NOTE — Patient Instructions (Signed)
Thank you for coming in today. Attend PT.  We will consider repeat or other injection based on how you respond to physical therapy.  For urine issue make sure you follow up with your urologist.  Also will try oxybutin at bedtime.  Check back with me in 1 month.  Oxybutynin tablets What is this medicine? OXYBUTYNIN (ox i BYOO ti nin) is used to treat overactive bladder. This medicine reduces the amount of bathroom visits. It may also help to control wetting accidents. This medicine may be used for other purposes; ask your health care provider or pharmacist if you have questions. COMMON BRAND NAME(S): Ditropan What should I tell my health care provider before I take this medicine? They need to know if you have any of these conditions:  autonomic neuropathy  dementia  difficulty passing urine  glaucoma  intestinal obstruction  kidney disease  liver disease  myasthenia gravis  Parkinson's disease  an unusual or allergic reaction to oxybutynin, other medicines, foods, dyes, or preservatives  pregnant or trying to get pregnant  breast-feeding How should I use this medicine? Take this medicine by mouth with a glass of water. Follow the directions on the prescription label. You can take this medicine with or without food. Take your medicine at regular intervals. Do not take your medicine more often than directed. Talk to your pediatrician regarding the use of this medicine in children. Special care may be needed. While this drug may be prescribed for children as young as 5 years for selected conditions, precautions do apply. Overdosage: If you think you have taken too much of this medicine contact a poison control center or emergency room at once. NOTE: This medicine is only for you. Do not share this medicine with others. What if I miss a dose? If you miss a dose, take it as soon as you can. If it is almost time for your next dose, take only that dose. Do not take double or extra  doses. What may interact with this medicine?  antihistamines for allergy, cough and cold  atropine  certain medicines for bladder problems like oxybutynin, tolterodine  certain medicines for Parkinson's disease like benztropine, trihexyphenidyl  certain medicines for stomach problems like dicyclomine, hyoscyamine  certain medicines for travel sickness like scopolamine  clarithromycin  erythromycin  ipratropium  medicines for fungal infections, like fluconazole, itraconazole, ketoconazole or voriconazole This list may not describe all possible interactions. Give your health care provider a list of all the medicines, herbs, non-prescription drugs, or dietary supplements you use. Also tell them if you smoke, drink alcohol, or use illegal drugs. Some items may interact with your medicine. What should I watch for while using this medicine? It may take a few weeks to notice the full benefit from this medicine. You may need to limit your intake tea, coffee, caffeinated sodas, and alcohol. These drinks may make your symptoms worse. You may get drowsy or dizzy. Do not drive, use machinery, or do anything that needs mental alertness until you know how this medicine affects you. Do not stand or sit up quickly, especially if you are an older patient. This reduces the risk of dizzy or fainting spells. Alcohol may interfere with the effect of this medicine. Avoid alcoholic drinks. Your mouth may get dry. Chewing sugarless gum or sucking hard candy, and drinking plenty of water may help. Contact your doctor if the problem does not go away or is severe. This medicine may cause dry eyes and blurred vision. If you  wear contact lenses, you may feel some discomfort. Lubricating drops may help. See your eyecare professional if the problem does not go away or is severe. Avoid extreme heat. This medicine can cause you to sweat less than normal. Your body temperature could increase to dangerous levels, which  may lead to heat stroke. What side effects may I notice from receiving this medicine? Side effects that you should report to your doctor or health care professional as soon as possible:  allergic reactions like skin rash, itching or hives, swelling of the face, lips, or tongue  agitation  breathing problems  confusion  fever  flushing (reddening of the skin)  hallucinations  memory loss  pain or difficulty passing urine  palpitations  unusually weak or tired Side effects that usually do not require medical attention (report to your doctor or health care professional if they continue or are bothersome):  constipation  headache  sexual difficulties (impotence) This list may not describe all possible side effects. Call your doctor for medical advice about side effects. You may report side effects to FDA at 1-800-FDA-1088. Where should I keep my medicine? Keep out of the reach of children. Store at room temperature between 15 and 30 degrees C (59 and 86 degrees F). Protect from moisture and humidity. Throw away any unused medicine after the expiration date. NOTE: This sheet is a summary. It may not cover all possible information. If you have questions about this medicine, talk to your doctor, pharmacist, or health care provider.  2020 Elsevier/Gold Standard (2013-04-12 10:57:08)

## 2018-09-26 NOTE — Progress Notes (Signed)
Harold Edwards is a 63 y.o. male who presents to [place]: [facility] today for follow-up back pain and lumbar radiculopathy and discuss urinary incontinence.  Patient has been seen for back pain and lumbar radiculopathy.  He subsequently had MRI and then on August 6 epidural steroid injection at right L4-5.  In the interim he notes that his pain radiating down his leg into his foot has improved considerably.  He still is a little bit of discomfort and tingling but overall is much better.  However he continues to have moderate to significant back pain.  He notes pain is confined mostly to the left side of his back and is worse with activity and sitting.  He notes that he had a few days of pain relief following the injection but it did not last very long.  He is not had a trial of dedicated physical therapy for this particular episode of back pain yet.  Additionally patient had a history of robotic prostatectomy a few years ago followed by x-ray external beam therapy for prostate cancer with a high Gleason score.  He has developed some urinary symptoms following surgery.  He had a urinary incontinence.  This improved with some pelvic therapy at home exercise program.  However or started worsening again about 6 months ago.  He notes frequent urinary leakage during the day with lifting but more bothersome is large volumes of urinary incontinence occurring at night.  He notes that this happens several times per night and is very obnoxious.  He has not talked about this with his urologist recently.   Park sent me the following message recently.  Hello Dr. Georgina Snell Four and half years ago I had prostate surgery followed by radiation treatments. I have not had a PSA test done in a year and a half will you be able to do this? also 6 months ago I started having incontinence issues with leaking at night  badly. Can you help me with this?  80% of the pain in my right big right toe is gone. The pain is still across the lower part of my back? Could this have anything to do with my incontinence? Can you help me with these issues? Thank you Harold Edwards  ROS as above:  Exam:  BP 139/86 (BP Location: Left Arm, Patient Position: Sitting, Cuff Size: Normal)   Pulse (!) 116   Temp 98 F (36.7 C) (Oral)   Wt 208 lb (94.3 kg)   BMI 27.44 kg/m  Wt Readings from Last 5 Encounters:  09/26/18 208 lb (94.3 kg)  08/22/18 213 lb (96.6 kg)  05/01/18 216 lb (98 kg)  04/14/18 214 lb (97.1 kg)  04/05/18 210 lb (95.3 kg)    Gen: Well NAD HEENT: EOMI,  MMM Lungs: Normal work of breathing. CTABL Heart: RRR no MRG Abd: NABS, Soft. Nondistended, Nontender Exts: Brisk capillary refill, warm and well perfused.  MSK: Nontender to spinal midline.  Tender palpation left lumbar paraspinal musculature. Decreased lumbar motion. Lower extremity strength is intact. Normal gait.  Lab and Radiology Results Dg Lumbar Spine Complete  Result Date: 08/22/2018 CLINICAL DATA:  Chronic low back pain EXAM: LUMBAR SPINE - COMPLETE 4+ VIEW COMPARISON:  None. FINDINGS: Five lumbar type vertebral bodies are well visualized. Vertebral body height is well maintained. No pars defects are seen. Disc space narrowing at L4-5 and L5-S1 is seen. Multilevel osteophytic changes are noted. No soft tissue abnormality is seen.  No anterolisthesis is noted. IMPRESSION: Degenerative change without acute abnormality. Electronically Signed   By: Inez Catalina M.D.   On: 08/22/2018 08:37   Mr Lumbar Spine Wo Contrast  Result Date: 08/27/2018 CLINICAL DATA:  Chronic low back pain and right leg pain, worsening recently. EXAM: MRI LUMBAR SPINE WITHOUT CONTRAST TECHNIQUE: Multiplanar, multisequence MR imaging of the lumbar spine was performed. No intravenous contrast was administered. COMPARISON:  Radiography 08/22/2018 FINDINGS: Segmentation:  5  lumbar type vertebral bodies. Alignment:  Normal Vertebrae:  No fracture or primary bone lesion. Conus medullaris and cauda equina: Conus extends to the L1 level. Conus and cauda equina appear normal. Paraspinal and other soft tissues: Negative Disc levels: T12-L1, L1-2 and L2-3: Minimal, non-compressive disc bulges. L3-4: Mild bulging of the disc with slight focal prominence in the right lateral foraminal to extraforaminal region. No apparent neural compression however. Some potential the right L3 nerve could be irritated. L4-5: Circumferential bulging of the disc. Mild facet and ligamentous hypertrophy. Stenosis of both lateral recesses that could affect either L5 nerve. Mild bilateral foraminal narrowing. L5-S1: Chronic disc degeneration with endplate osteophytes and bulging of the disc more prominent towards the left. Mild narrowing of the subarticular lateral recess and intervertebral foramen on the left but without visible neural compression. No right-sided stenosis. IMPRESSION: Chronic lower lumbar degenerative changes without evidence of acute finding. L3-4: Circumferential bulging of the disc, focally prominent in the right lateral foraminal to extraforaminal region. This is adjacent to the right L3 nerve. Definite nerve compression is not seen, but the right L3 nerve could be irritated. L4-5: Circumferential bulging of the disc. Mild facet hypertrophy. Stenosis of both lateral recesses that would have potential to affect either or both L5 nerves. Mild bilateral foraminal narrowing as well. L5-S1: Left posterolateral predominant osteophyte and bulging disc. Mild stenosis of the subarticular lateral recess and intervertebral foramen on the left, but no definite neural compression. Electronically Signed   By: Nelson Chimes M.D.   On: 08/27/2018 09:55   Dg Inject Diag/thera/inc Needle/cath/plc Epi/lumb/sac W/img  Result Date: 09/14/2018 CLINICAL DATA:  Spondylosis without myelopathy. Right L5 radicular  symptoms. FLUOROSCOPY TIME:  0 minutes 24 seconds. 26.96 micro gray meter squared PROCEDURE: The procedure, risks, benefits, and alternatives were explained to the patient. Questions regarding the procedure were encouraged and answered. The patient understands and consents to the procedure. LUMBAR EPIDURAL INJECTION: An interlaminar approach was performed on the right at L4-5. The overlying skin was cleansed and anesthetized. A 20 gauge epidural needle was advanced using loss-of-resistance technique. DIAGNOSTIC EPIDURAL INJECTION: Injection of Isovue-M 200 shows a good epidural pattern with spread above and below the level of needle placement, primarily on the right. No vascular opacification is seen. THERAPEUTIC EPIDURAL INJECTION: One hundred twenty mg of Depo-Medrol mixed with 2 cc 1% lidocaine were instilled. The procedure was well-tolerated, and the patient was discharged thirty minutes following the injection in good condition. COMPLICATIONS: None IMPRESSION: Technically successful epidural injection on the right at L4-5 # 1. Electronically Signed   By: Nelson Chimes M.D.   On: 09/14/2018 13:57   I personally (independently) visualized and performed the interpretation of the images attached in this note.  Lab values from [facility] reviewed.  PSA has been undetectable for several years now.  Assessment and Plan: 63 y.o. male with back pain.  Patient continues to experience back pain following epidural steroid injection.  His lumbar radicular pain improved but his back pain did not.  I think it is reasonable  to proceed with a trial of physical therapy.  If no benefit from that may proceed with either repeat epidural steroid injection or potentially facet injection.  Check back in 1 month for this issue.  Urinary incontinence following prostatectomy: Patient had prostatectomy and external beam radiation therapy and has had some urinary incontinence as result.  He worsened about 6 months ago and notes that  his symptoms are quite obnoxious now.  He notes the daytime dribbling with lifting is a little annoying but the nighttime symptoms are much worse and new.  I think it is reasonable to follow back up with his urologist.  Will check basic labs listed below including PSA metabolic panel etc.  However will have a trial of oxybutynin as well.  Check back in 1 month.  Additionally patient will be due for flu vaccine.  Will administer this in 1 month as we do not have it today.   PDMP not reviewed this encounter. Orders Placed This Encounter  Procedures  . CBC  . COMPLETE METABOLIC PANEL WITH GFR  . PSA  . Hepatitis C antibody  . Direct LDL  . Ambulatory referral to Physical Therapy    Referral Priority:   Routine    Referral Type:   Physical Medicine    Referral Reason:   Specialty Services Required    Requested Specialty:   Physical Therapy   Meds ordered this encounter  Medications  . oxybutynin (DITROPAN) 5 MG tablet    Sig: Take 1 tablet (5 mg total) by mouth at bedtime as needed for bladder spasms.    Dispense:  30 tablet    Refill:  3     Historical information moved to improve visibility of documentation.  Past Medical History:  Diagnosis Date  . Cancer ([place])   . Chronic cough 09/19/2017  . Influenza   . Pneumonia   . Prostate cancer ([place]) 02/21/2014   4+4=8 s/p TRUS biopsy by Dr. Elnoria Howard on 01/18/14  Last Assessment & Plan:   Patient be managed by urology.  Patient is status post prostatectomy and radiation therapy.  Denies hematuria.  . Sinus tachycardia 09/13/2017  . Stress incontinence 04/10/2014   Past Surgical History:  Procedure Laterality Date  . ABDOMINAL SURGERY    . PROSTATE SURGERY     Prostatectomy robot. Dr Tommi Rumps and Dr Redmond Pulling  . TONSILLECTOMY     Social History   Tobacco Use  . Smoking status: Former Smoker    Packs/day: 1.00    Years: 13.00    Pack years: 13.00    Types: Cigarettes    Start date: 02/08/1974    Quit date: 10/02/1988    Years since  quitting: 30.0  . Smokeless tobacco: Never Used  Substance Use Topics  . Alcohol use: Yes   family history includes Breast cancer in an other family member; Diabetes in an other family member.  Medications: Current Outpatient Medications  Medication Sig Dispense Refill  . traMADol (ULTRAM) 50 MG tablet Take 1 tablet (50 mg total) by mouth every 8 (eight) hours as needed for severe pain. 15 tablet 0  . metoprolol succinate (TOPROL-XL) 50 MG 24 hr tablet Take 1 tablet (50 mg total) by mouth daily. Take with or immediately following a meal. (Patient not taking: Reported on 08/22/2018) 90 tablet 3  . oxybutynin (DITROPAN) 5 MG tablet Take 1 tablet (5 mg total) by mouth at bedtime as needed for bladder spasms. 30 tablet 3   No current facility-administered medications for this visit.  Allergies  Allergen Reactions  . Amoxicillin Diarrhea    Stomach cramps     Discussed warning signs or symptoms. Please see discharge instructions. Patient expresses understanding.

## 2018-09-27 ENCOUNTER — Ambulatory Visit (INDEPENDENT_AMBULATORY_CARE_PROVIDER_SITE_OTHER): Payer: Managed Care, Other (non HMO) | Admitting: Physical Therapy

## 2018-09-27 ENCOUNTER — Encounter: Payer: Self-pay | Admitting: Physical Therapy

## 2018-09-27 DIAGNOSIS — M5441 Lumbago with sciatica, right side: Secondary | ICD-10-CM

## 2018-09-27 DIAGNOSIS — G8929 Other chronic pain: Secondary | ICD-10-CM

## 2018-09-27 DIAGNOSIS — R29898 Other symptoms and signs involving the musculoskeletal system: Secondary | ICD-10-CM

## 2018-09-27 LAB — COMPLETE METABOLIC PANEL WITH GFR
AG Ratio: 1.5 (calc) (ref 1.0–2.5)
ALT: 27 U/L (ref 9–46)
AST: 14 U/L (ref 10–35)
Albumin: 4.3 g/dL (ref 3.6–5.1)
Alkaline phosphatase (APISO): 55 U/L (ref 35–144)
BUN: 23 mg/dL (ref 7–25)
CO2: 25 mmol/L (ref 20–32)
Calcium: 10 mg/dL (ref 8.6–10.3)
Chloride: 102 mmol/L (ref 98–110)
Creat: 1.1 mg/dL (ref 0.70–1.25)
GFR, Est African American: 84 mL/min/{1.73_m2} (ref >=60)
GFR, Est Non African American: 72 mL/min/{1.73_m2} (ref >=60)
Globulin: 2.9 g/dL (calc) (ref 1.9–3.7)
Glucose, Bld: 91 mg/dL (ref 65–99)
Potassium: 4.1 mmol/L (ref 3.5–5.3)
Sodium: 139 mmol/L (ref 135–146)
Total Bilirubin: 0.4 mg/dL (ref 0.2–1.2)
Total Protein: 7.2 g/dL (ref 6.1–8.1)

## 2018-09-27 LAB — CBC
HCT: 48.6 % (ref 38.5–50.0)
Hemoglobin: 16.1 g/dL (ref 13.2–17.1)
MCH: 29 pg (ref 27.0–33.0)
MCHC: 33.1 g/dL (ref 32.0–36.0)
MCV: 87.6 fL (ref 80.0–100.0)
MPV: 11.3 fL (ref 7.5–12.5)
Platelets: 250 10*3/uL (ref 140–400)
RBC: 5.55 10*6/uL (ref 4.20–5.80)
RDW: 14.4 % (ref 11.0–15.0)
WBC: 6.9 10*3/uL (ref 3.8–10.8)

## 2018-09-27 LAB — LDL CHOLESTEROL, DIRECT: Direct LDL: 130 mg/dL — ABNORMAL HIGH (ref <100)

## 2018-09-27 LAB — PSA: PSA: <0.1 ng/mL (ref <=4.0)

## 2018-09-27 LAB — HEPATITIS C ANTIBODY
Hepatitis C Ab: NONREACTIVE
SIGNAL TO CUT-OFF: 0.01 (ref <1.00)

## 2018-09-27 NOTE — Therapy (Signed)
[place], [place], [address] Phone: [phone]   Fax:  [phone]  Physical Therapy Evaluation  Patient Details  Name: Harold Edwards MRN: [id number] Date of Birth: 07-23-1955 Referring Provider (PT): Gregor Hams, MD   Encounter Date: 09/27/2018  PT End of Session - 09/27/18 0759    Visit Number  1    Number of Visits  12    Date for PT Re-Evaluation  11/08/18    PT Start Time  0715    PT Stop Time  0755    PT Time Calculation (min)  40 min    Activity Tolerance  Patient tolerated treatment well    Behavior During Therapy  [facility] for tasks assessed/performed       Past Medical History:  Diagnosis Date  . Cancer ([place])   . Chronic cough 09/19/2017  . Influenza   . Pneumonia   . Prostate cancer ([place]) 02/21/2014   4+4=8 s/p TRUS biopsy by Dr. Elnoria Howard on 01/18/14  Last Assessment & Plan:   Patient be managed by urology.  Patient is status post prostatectomy and radiation therapy.  Denies hematuria.  . Sinus tachycardia 09/13/2017  . Stress incontinence 04/10/2014    Past Surgical History:  Procedure Laterality Date  . ABDOMINAL SURGERY    . PROSTATE SURGERY     Prostatectomy robot. Dr Tommi Rumps and Dr Redmond Pulling  . TONSILLECTOMY      There were no vitals filed for this visit.   Subjective Assessment - 09/27/18 0717    Subjective  Pt is a 63 y/o male who presents to OPPT for chronic LBP.  Pt reports 30 year hx of LBP, with exacerbation 2-3 weeks ago.  Pt had ESI which helped ~ 80% for radicular symptoms but pain persists in low back.  Pt states MD feels another injection may be necessary.    Pertinent History  prostate cancer    Limitations  Sitting    How long can you sit comfortably?  < 5 min    Patient Stated Goals  improve pain; "I've done this before, and if it's just exercises it ain't going to do anything"    Currently in Pain?  Yes    Pain Score  5    up to 10/10; at best 5/10   Pain  Location  Back    Pain Orientation  Lower;Right;Left    Pain Descriptors / Indicators  Aching;Constant;Spasm;Sharp;Shooting    Pain Radiating Towards  1st toe    Pain Onset  More than a month ago    Pain Frequency  Constant    Aggravating Factors   sitting, lifting    Pain Relieving Factors  nothing         [facility] PT Assessment - 09/27/18 0723      Assessment   Medical Diagnosis  M54.16 (ICD-10-CM) - Lumbar back pain with radiculopathy affecting right lower extremity    Referring Provider (PT)  Gregor Hams, MD    Onset Date/Surgical Date  --   2-3 weeks ago   Hand Dominance  Right    Next MD Visit  10/27/18    Prior Therapy  multiple times due to chronic nature of pain      Precautions   Precautions  None      Restrictions   Weight Bearing Restrictions  No      Balance Screen   Has the patient fallen in the past 6 months  No  Has the patient had a decrease in activity level because of a fear of falling?   No    Is the patient reluctant to leave their home because of a fear of falling?   No      Home Film/video editor residence    Living Arrangements  Spouse/significant other    Additional Comments  no difficulty with stairs; has 10 month old granddaughter and denies difficulty with carrying her      Prior Function   Level of Independence  Independent    Vocation  Full time employment    Artist - delivery driver (freight, pushing/pulling up to 2000#)    Leisure  fishing, ride motorcycle; no regular exercise recently (has exercises from prior PT but inconsistent with compliance)      Cognition   Overall Cognitive Status  Within Functional Limits for tasks assessed      ROM / Strength   AROM / PROM / Strength  AROM;Strength      AROM   AROM Assessment Site  Lumbar    Lumbar Flexion  WNL    Lumbar Extension  WNL with increased pain    Lumbar - Right Side Bend  limited 25%    Lumbar - Left Side Bend  limited 25%     Lumbar - Right Rotation  WNL    Lumbar - Left Rotation  WNL      Strength   Strength Assessment Site  Hip;Knee;Ankle    Right/Left Hip  Right;Left    Right Hip Flexion  5/5    Right Hip Extension  5/5    Right Hip ABduction  4/5    Left Hip Flexion  5/5    Left Hip Extension  5/5    Left Hip ABduction  4/5    Right/Left Knee  Right;Left    Right Knee Extension  5/5    Left Knee Extension  5/5    Right/Left Ankle  Right;Left    Right Ankle Dorsiflexion  5/5    Left Ankle Dorsiflexion  5/5      Flexibility   Soft Tissue Assessment /Muscle Length  yes    Hamstrings  tightness bil    Piriformis  tightness bil      Palpation   Palpation comment  tightness and trigger points noted in Lt QL and thoracic/lumbar paraspinals      Special Tests    Special Tests  Lumbar    Lumbar Tests  Slump Test;Straight Leg Raise      Slump test   Findings  Negative      Straight Leg Raise   Findings  Negative                Objective measurements completed on examination: See above findings.      [place] Adult PT Treatment/Exercise - 09/27/18 0001      Self-Care   Self-Care  Other Self-Care Comments    Other Self-Care Comments   instructed in HEP for flexibility      Manual Therapy   Manual Therapy  Soft tissue mobilization    Soft tissue mobilization  Lt QL into thoracic/lumbar paraspinals       Trigger Point Dry Needling - 09/27/18 0757    Consent Given?  Yes    Education Handout Provided  Yes    Muscles Treated Back/Hip  Erector spinae;Quadratus lumborum    Erector spinae Response  Twitch response elicited;Palpable increased muscle length  Quadratus Lumborum Response  Twitch response elicited;Palpable increased muscle length           PT Education - 09/27/18 0758    Education Details  HEP, DN    Person(s) Educated  Patient    Methods  Explanation;Demonstration;Handout    Comprehension  Verbalized understanding;Returned demonstration;Need further instruction           PT Long Term Goals - 09/27/18 0854      PT LONG TERM GOAL #1   Title  independent with HEP    Status  New    Target Date  11/08/18      PT LONG TERM GOAL #2   Title  report pain < 4/10 with sitting > 20 min for improved function    Status  New    Target Date  11/08/18      PT LONG TERM GOAL #3   Title  verbalize understanding of posture/body mechanics to decrease risk of reinjury    Status  New    Target Date  11/08/18      PT LONG TERM GOAL #4   Title  perform lumbar ROM without increase in pain for improved flexibility and function    Status  New    Target Date  11/08/18             Plan - 09/27/18 0759    Clinical Impression Statement  Pt is a 63 y/o male who presents to OPPT for chronic LBP with recent exacerbation.  Pt demonstrates mild ROM limitations with pain, decreased flexibility and active trigger points.  DN and manual therapy performed today with positive response and pt reporting decreased pain and tightness following session.  Pt states that he's had multiple rounds of PT in the past, "and if all I'm going to do is exercise, it's not going to work."  Pt also expresses minimal compliance with exercises as well.  Will benefit from PT to address deficits listed.    Personal Factors and Comorbidities  Past/Current Experience;Profession;Comorbidity 1    Comorbidities  Prostate Cancer (dx ~ 4 yrs ago)    Examination-Activity Limitations  Sleep;Bed Mobility;Squat;Bend;Sit;Lift    Examination-Participation Restrictions  Other   work   Stability/Clinical Decision Making  Evolving/Moderate complexity    Clinical Decision Making  Moderate    Rehab Potential  Fair    PT Frequency  2x / week   1-2x/wk   PT Duration  6 weeks    PT Treatment/Interventions  ADLs/Self Care Home Management;Cryotherapy;Iontophoresis 4mg /ml Dexamethasone;Moist Heat;Traction;Therapeutic exercise;Therapeutic activities;Functional mobility training;Patient/family education;Manual  techniques;Taping;Dry needling;Spinal Manipulations    PT Next Visit Plan  review stretches; assess response to DN/manual; progress HEP to include QL stretch, hip flexor/quad stretch, core stability, posture/body mechanics education; no estim due to hx prostate cancer    PT Home Exercise Plan  Access Code: [email or web address]    Consulted and Agree with Plan of Care  Patient       Patient will benefit from skilled therapeutic intervention in order to improve the following deficits and impairments:  Increased fascial restricitons, Increased muscle spasms, Pain, Decreased range of motion, Impaired flexibility  Visit Diagnosis: 1. Chronic bilateral low back pain with right-sided sciatica   2. Other symptoms and signs involving the musculoskeletal system        Problem List Patient Active Problem List   Diagnosis Date Noted  . Former smoker, stopped smoking in distant past 04/20/2018  . 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  Chronic cough 09/19/2017  . Mixed hyperlipidemia 09/19/2017  . Medication side effects 09/13/2017  . Sinus tachycardia 09/13/2017  . ED (erectile dysfunction) 04/10/2014  . Stress incontinence 04/10/2014  . S/P prostatectomy 03/18/2014  . History of prostate cancer 02/21/2014      Laureen Abrahams, PT, DPT 09/27/18 8:58 AM     [facility] [place], [place], [address] Phone: [phone]   Fax:  [phone]  Name: Harold Edwards MRN: [id number] Date of Birth: April 26, 1955

## 2018-09-27 NOTE — Patient Instructions (Signed)
Access Code: [email or web address]  URL: [email or web address]  Date: 09/27/2018  Prepared by: Faustino Congress   Exercises  Supine Piriformis Stretch with Foot on Ground - 3 reps - 1 sets - 30 sec hold - 2x daily - 7x weekly  Hooklying Hamstring Stretch with Strap - 3 reps - 1 sets - 30 sec hold - 2x daily - 7x weekly  Patient Education  Trigger Point Dry Needling

## 2018-10-04 ENCOUNTER — Ambulatory Visit (INDEPENDENT_AMBULATORY_CARE_PROVIDER_SITE_OTHER): Payer: Managed Care, Other (non HMO) | Admitting: Physical Therapy

## 2018-10-04 ENCOUNTER — Encounter: Payer: Self-pay | Admitting: Physical Therapy

## 2018-10-04 ENCOUNTER — Other Ambulatory Visit: Payer: Self-pay

## 2018-10-04 DIAGNOSIS — G8929 Other chronic pain: Secondary | ICD-10-CM

## 2018-10-04 DIAGNOSIS — M5441 Lumbago with sciatica, right side: Secondary | ICD-10-CM | POA: Diagnosis not present

## 2018-10-04 DIAGNOSIS — R29898 Other symptoms and signs involving the musculoskeletal system: Secondary | ICD-10-CM

## 2018-10-04 NOTE — Therapy (Signed)
[place], [place], [address] Phone: [phone]   Fax:  [phone]  Physical Therapy Treatment  Patient Details  Name: Harold Edwards MRN: [id number] Date of Birth: Apr 19, 1955 Referring Provider (PT): Gregor Hams, MD   Encounter Date: 10/04/2018  PT End of Session - 10/04/18 1425    Visit Number  2    Number of Visits  12    Date for PT Re-Evaluation  11/08/18    PT Start Time  0623    PT Stop Time  1423    PT Time Calculation (min)  40 min    Activity Tolerance  Patient tolerated treatment well    Behavior During Therapy  [facility] for tasks assessed/performed       Past Medical History:  Diagnosis Date  . Cancer ([place])   . Chronic cough 09/19/2017  . Influenza   . Pneumonia   . Prostate cancer ([place]) 02/21/2014   4+4=8 s/p TRUS biopsy by Dr. Elnoria Howard on 01/18/14  Last Assessment & Plan:   Patient be managed by urology.  Patient is status post prostatectomy and radiation therapy.  Denies hematuria.  . Sinus tachycardia 09/13/2017  . Stress incontinence 04/10/2014    Past Surgical History:  Procedure Laterality Date  . ABDOMINAL SURGERY    . PROSTATE SURGERY     Prostatectomy robot. Dr Tommi Rumps and Dr Redmond Pulling  . TONSILLECTOMY      There were no vitals filed for this visit.  Subjective Assessment - 10/04/18 1344    Subjective  back is feeling better, just returned from fishing trip in [place] and heading to beach tomorrow.  reports back feels better because he isn't working - returns to work on Monday 8/31    Pertinent History  prostate cancer    Limitations  Sitting    How long can you sit comfortably?  < 5 min    Patient Stated Goals  improve pain; "I've done this before, and if it's just exercises it ain't going to do anything"    Currently in Pain?  Yes    Pain Score  3     Pain Location  Back    Pain Orientation  Lower;Left;Right    Pain Descriptors / Indicators  Aching;Constant;Spasm    Pain  Onset  More than a month ago    Pain Frequency  Constant    Aggravating Factors   sitting, lifting    Pain Relieving Factors  nothing                       [facility] Adult PT Treatment/Exercise - 10/04/18 1346      Exercises   Exercises  Lumbar      Lumbar Exercises: Stretches   Prone on Elbows Stretch  4 reps;60 seconds   continuous   Press Ups  10 reps;10 seconds      Lumbar Exercises: Aerobic   Tread Mill  2.5 mph x 5 min      Lumbar Exercises: Standing   Other Standing Lumbar Exercises  Rt lateral shift correction 10x10 sec      Manual Therapy   Manual Therapy  Soft tissue mobilization    Soft tissue mobilization  Lt QL into thoracic/lumbar paraspinals; IASTM       Trigger Point Dry Needling - 10/04/18 1424    Consent Given?  Yes    Education Handout Provided  Previously provided    Muscles Treated Back/Hip  Erector spinae;Lumbar  multifidi;Quadratus lumborum    Erector spinae Response  Twitch response elicited;Palpable increased muscle length    Lumbar multifidi Response  Twitch response elicited    Quadratus Lumborum Response  Twitch response elicited;Palpable increased muscle length           PT Education - 10/04/18 1424    Education Details  HEP    Person(s) Educated  Patient    Methods  Explanation;Demonstration;Handout    Comprehension  Verbalized understanding;Returned demonstration;Need further instruction          PT Long Term Goals - 09/27/18 0854      PT LONG TERM GOAL #1   Title  independent with HEP    Status  New    Target Date  11/08/18      PT LONG TERM GOAL #2   Title  report pain < 4/10 with sitting > 20 min for improved function    Status  New    Target Date  11/08/18      PT LONG TERM GOAL #3   Title  verbalize understanding of posture/body mechanics to decrease risk of reinjury    Status  New    Target Date  11/08/18      PT LONG TERM GOAL #4   Title  perform lumbar ROM without increase in pain for improved  flexibility and function    Status  New    Target Date  11/08/18            Plan - 10/04/18 1425    Clinical Impression Statement  Pt reported resolve of radicular symptoms in Rt toe following session today.  Overall pain decreasing, and pt progressing well.  No goals met as only 2nd visit.    Personal Factors and Comorbidities  Past/Current Experience;Profession;Comorbidity 1    Comorbidities  Prostate Cancer (dx ~ 4 yrs ago)    Examination-Activity Limitations  Sleep;Bed Mobility;Squat;Bend;Sit;Lift    Examination-Participation Restrictions  Other   work   Stability/Clinical Decision Making  Evolving/Moderate complexity    Rehab Potential  Fair    PT Frequency  2x / week   1-2x/wk   PT Duration  6 weeks    PT Treatment/Interventions  ADLs/Self Care Home Management;Cryotherapy;Iontophoresis 32m/ml Dexamethasone;Moist Heat;Traction;Therapeutic exercise;Therapeutic activities;Functional mobility training;Patient/family education;Manual techniques;Taping;Dry needling;Spinal Manipulations    PT Next Visit Plan  review stretches; assess response to DN/manual; progress HEP to include QL stretch, hip flexor/quad stretch, core stability, posture/body mechanics education; no estim due to hx prostate cancer    PT Home Exercise Plan  Access Code: [email or web address]   Consulted and Agree with Plan of Care  Patient       Patient will benefit from skilled therapeutic intervention in order to improve the following deficits and impairments:  Increased fascial restricitons, Increased muscle spasms, Pain, Decreased range of motion, Impaired flexibility  Visit Diagnosis: Chronic bilateral low back pain with right-sided sciatica  Other symptoms and signs involving the musculoskeletal system     Problem List Patient Active Problem List   Diagnosis Date Noted  . Former smoker, stopped smoking in distant past 04/20/2018  . Lumbar back pain with radiculopathy affecting right lower extremity 04/14/2018   . Chronic cough 09/19/2017  . Mixed hyperlipidemia 09/19/2017  . Medication side effects 09/13/2017  . Sinus tachycardia 09/13/2017  . ED (erectile dysfunction) 04/10/2014  . Stress incontinence 04/10/2014  . S/P prostatectomy 03/18/2014  . History of prostate cancer 02/21/2014      SLaureen Abrahams PT, DPT 10/04/18 2:26 PM  [place], [place], [address] Phone: [phone]   Fax:  [phone]  Name: Harold Edwards MRN: [id number] Date of Birth: Oct 14, 1955

## 2018-10-04 NOTE — Patient Instructions (Signed)
Access Code: [email or web address]  URL: [email or web address]  Date: 10/04/2018  Prepared by: Faustino Congress   Exercises  Supine Piriformis Stretch with Foot on Ground - 3 reps - 1 sets - 30 sec hold - 2x daily - 7x weekly  Hooklying Hamstring Stretch with Strap - 3 reps - 1 sets - 30 sec hold - 2x daily - 7x weekly  Right Standing Lateral Shift Correction at Wall - Repetitions - 10 reps - 1 sets - 10 sec hold - 3x daily - 7x weekly  Prone on Elbows Stretch - 1 reps - 1 sets - 3 min hold - 3x daily - 7x weekly  Patient Education  Trigger Point Dry Needling

## 2018-10-11 ENCOUNTER — Ambulatory Visit (INDEPENDENT_AMBULATORY_CARE_PROVIDER_SITE_OTHER): Payer: Managed Care, Other (non HMO) | Admitting: Physical Therapy

## 2018-10-11 ENCOUNTER — Encounter: Payer: Self-pay | Admitting: Physical Therapy

## 2018-10-11 ENCOUNTER — Other Ambulatory Visit: Payer: Self-pay

## 2018-10-11 DIAGNOSIS — M5441 Lumbago with sciatica, right side: Secondary | ICD-10-CM | POA: Diagnosis not present

## 2018-10-11 DIAGNOSIS — G8929 Other chronic pain: Secondary | ICD-10-CM

## 2018-10-11 DIAGNOSIS — R29898 Other symptoms and signs involving the musculoskeletal system: Secondary | ICD-10-CM

## 2018-10-11 NOTE — Therapy (Addendum)
[place], [place], [address] Phone: [phone]   Fax:  [phone]  Physical Therapy Treatment/Discharge Summary  Patient Details  Name: Harold Edwards MRN: [id number] Date of Birth: [birth year] Referring Provider (PT): Gregor Hams, MD   Encounter Date: 10/11/2018  PT End of Session - 10/11/18 0752    Visit Number  3    Number of Visits  12    Date for PT Re-Evaluation  11/08/18    PT Start Time  0715    PT Stop Time  0753    PT Time Calculation (min)  38 min    Activity Tolerance  Patient tolerated treatment well    Behavior During Therapy  [facility] for tasks assessed/performed       Past Medical History:  Diagnosis Date  . Cancer ([place])   . Chronic cough 09/19/2017  . Influenza   . Pneumonia   . Prostate cancer ([place]) 02/21/2014   4+4=8 s/p TRUS biopsy by Dr. Elnoria Howard on 01/18/14  Last Assessment & Plan:   Patient be managed by urology.  Patient is status post prostatectomy and radiation therapy.  Denies hematuria.  . Sinus tachycardia 09/13/2017  . Stress incontinence 04/10/2014    Past Surgical History:  Procedure Laterality Date  . ABDOMINAL SURGERY    . PROSTATE SURGERY     Prostatectomy robot. Dr Tommi Rumps and Dr Redmond Pulling  . TONSILLECTOMY      There were no vitals filed for this visit.  Subjective Assessment - 10/11/18 0716    Subjective  Rt toe is the same - isn't worse; back is feeling better    Pertinent History  prostate cancer    Limitations  Sitting    How long can you sit comfortably?  < 5 min    Patient Stated Goals  improve pain; "I've done this before, and if it's just exercises it ain't going to do anything"    Currently in Pain?  Yes    Pain Score  4     Pain Location  Back    Pain Orientation  Left;Right;Lower    Pain Descriptors / Indicators  Aching;Constant;Spasm    Pain Type  Chronic pain    Pain Radiating Towards  1st toe on Rt    Pain Onset  More than a month ago     Pain Frequency  Constant    Aggravating Factors   sitting, lifting    Pain Relieving Factors  DN                       [facility] Adult PT Treatment/Exercise - 10/11/18 0717      Lumbar Exercises: Stretches   Active Hamstring Stretch  --   10x5 sec hold bil   Passive Hamstring Stretch  Right;Left;3 reps;30 seconds    Passive Hamstring Stretch Limitations  supine with strap    Single Knee to Chest Stretch  --   10x5 sec   Piriformis Stretch  Right;Left;3 reps;30 seconds    Gastroc Stretch Limitations  supine ankle pump with hip flexed and knee extended; 10x5 sec hold bil      Lumbar Exercises: Aerobic   Tread Mill  2.5 mph x 5 min      Lumbar Exercises: Standing   Other Standing Lumbar Exercises  Rt lateral shift correction 10x10 sec    Other Standing Lumbar Exercises  self-mobilization with tennis ball x 2-3 min  PT Long Term Goals - 09/27/18 0854      PT LONG TERM GOAL #1   Title  independent with HEP    Status  New    Target Date  11/08/18      PT LONG TERM GOAL #2   Title  report pain < 4/10 with sitting > 20 min for improved function    Status  New    Target Date  11/08/18      PT LONG TERM GOAL #3   Title  verbalize understanding of posture/body mechanics to decrease risk of reinjury    Status  New    Target Date  11/08/18      PT LONG TERM GOAL #4   Title  perform lumbar ROM without increase in pain for improved flexibility and function    Status  New    Target Date  11/08/18            Plan - 10/11/18 0752    Clinical Impression Statement  Pt able to tolerate exercises today without change in pain per his report.  Slowly progressing with PT, and feels Rt foot pain is still persistent.  May want to hold after next session as pt scheduled to return to [place].    Personal Factors and Comorbidities  Past/Current Experience;Profession;Comorbidity 1    Comorbidities  Prostate Cancer (dx ~ 4 yrs ago)    Examination-Activity  Limitations  Sleep;Bed Mobility;Squat;Bend;Sit;Lift    Examination-Participation Restrictions  Other   work   Stability/Clinical Decision Making  Evolving/Moderate complexity    Rehab Potential  Fair    PT Frequency  2x / week   1-2x/wk   PT Duration  6 weeks    PT Treatment/Interventions  ADLs/Self Care Home Management;Cryotherapy;Iontophoresis '4mg'$ /ml Dexamethasone;Moist Heat;Traction;Therapeutic exercise;Therapeutic activities;Functional mobility training;Patient/family education;Manual techniques;Taping;Dry needling;Spinal Manipulations    PT Next Visit Plan  review stretches; assess response to DN/manual; progress HEP to include QL stretch, hip flexor/quad stretch, core stability, posture/body mechanics education; no estim due to hx prostate cancer    PT Home Exercise Plan  Access Code: [email or web address]    Consulted and Agree with Plan of Care  Patient       Patient will benefit from skilled therapeutic intervention in order to improve the following deficits and impairments:  Increased fascial restricitons, Increased muscle spasms, Pain, Decreased range of motion, Impaired flexibility  Visit Diagnosis: Chronic bilateral low back pain with right-sided sciatica  Other symptoms and signs involving the musculoskeletal system     Problem List Patient Active Problem List   Diagnosis Date Noted  . Former smoker, stopped smoking in distant past 04/20/2018  . 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 Chronic cough 09/19/2017  . Mixed hyperlipidemia 09/19/2017  . Medication side effects 09/13/2017  . Sinus tachycardia 09/13/2017  . ED (erectile dysfunction) 04/10/2014  . Stress incontinence 04/10/2014  . S/P prostatectomy 03/18/2014  . History of prostate cancer 02/21/2014      Laureen Abrahams, PT, DPT 10/11/18 7:56 AM     [facility] [place], [place],  [address] Phone: [phone]   Fax:  [phone]  Name: Harold Edwards MRN: [id number] Date of Birth: 1955-09-07       PHYSICAL THERAPY DISCHARGE SUMMARY  Visits from Start of Care: 3  Current functional level related to goals / functional outcomes: See above   Remaining deficits: unknown   Education / Equipment: HEP  Plan: Patient agrees  to discharge.  Patient goals were not met. Patient is being discharged due to not returning since the last visit.  ?????    Laureen Abrahams, PT, DPT 01/29/19 8:24 AM  [facility] [place], [place] [address]  [phone] (office) [phone] (fax)

## 2018-10-18 ENCOUNTER — Encounter: Payer: Managed Care, Other (non HMO) | Admitting: Rehabilitative and Restorative Service Providers"

## 2018-10-18 ENCOUNTER — Encounter: Payer: Managed Care, Other (non HMO) | Admitting: Physical Therapy

## 2018-10-27 ENCOUNTER — Ambulatory Visit: Payer: Managed Care, Other (non HMO) | Admitting: Family Medicine

## 2018-10-31 ENCOUNTER — Other Ambulatory Visit: Payer: Self-pay

## 2018-10-31 ENCOUNTER — Encounter: Payer: Self-pay | Admitting: Family Medicine

## 2018-10-31 ENCOUNTER — Ambulatory Visit (INDEPENDENT_AMBULATORY_CARE_PROVIDER_SITE_OTHER): Payer: Managed Care, Other (non HMO) | Admitting: Family Medicine

## 2018-10-31 VITALS — BP 136/90 | HR 84 | Temp 97.6°F | Wt 207.0 lb

## 2018-10-31 DIAGNOSIS — E782 Mixed hyperlipidemia: Secondary | ICD-10-CM

## 2018-10-31 DIAGNOSIS — Z23 Encounter for immunization: Secondary | ICD-10-CM | POA: Diagnosis not present

## 2018-10-31 DIAGNOSIS — D692 Other nonthrombocytopenic purpura: Secondary | ICD-10-CM

## 2018-10-31 DIAGNOSIS — Z8546 Personal history of malignant neoplasm of prostate: Secondary | ICD-10-CM | POA: Diagnosis not present

## 2018-10-31 DIAGNOSIS — M5416 Radiculopathy, lumbar region: Secondary | ICD-10-CM

## 2018-10-31 DIAGNOSIS — Z5181 Encounter for therapeutic drug level monitoring: Secondary | ICD-10-CM

## 2018-10-31 MED ORDER — OXYBUTYNIN CHLORIDE 5 MG PO TABS: 5.0000 mg | ORAL_TABLET | Freq: Every evening | ORAL | 3 refills | Status: DC | PRN

## 2018-10-31 MED ORDER — TRAMADOL HCL 50 MG PO TABS: 50.0000 mg | ORAL_TABLET | Freq: Three times a day (TID) | ORAL | 0 refills | Status: DC | PRN

## 2018-10-31 MED ORDER — ATORVASTATIN CALCIUM 10 MG PO TABS: 10.0000 mg | ORAL_TABLET | Freq: Every day | ORAL | 3 refills | Status: DC

## 2018-10-31 NOTE — Progress Notes (Signed)
Harold Edwards is a 63 y.o. male who presents to [place]: [place] today for back pain and follow-up urinary incontinence.  Patient was seen on August 18 for back pain with radiculopathy.  He had right leg radiculopathy thought to be L5.  He had had epidural steroid injection in early August and still having some back pain symptoms.  He was referred to physical therapy.  In physical therapy he has had significant improvement in his back pain.  However he continues to experience numbness that somewhat bothersome into the right foot.  He notes the back injection lasted a few weeks.  He had interlaminar approach at L4-5.  He also had been having episodes of urinary incontinence.  He has a history of prostate cancer status post proctectomy with external beam radiation.  He has had persistent dribbling which is not a problem however he had been having episodes of bothersome large volume loss of urinary continence at bedtime that was really obnoxious.  He trialed oxybutynin at the last visit.  He notes oxybutynin at bedtime has been very helpful for his urinary incontinence.  He was advised to follow-up with his urologist but has not yet.  He called and try to get an appointment but was never called back.  Additional labs were obtained at the last visit.  LDL was elevated at 130.  ROS as above:  Exam:  BP 136/90    Pulse 84    Temp 97.6 F (36.4 C) (Oral)    Wt 207 lb (93.9 kg)    BMI 27.31 kg/m  Wt Readings from Last 5 Encounters:  10/31/18 207 lb (93.9 kg)  09/26/18 208 lb (94.3 kg)  08/22/18 213 lb (96.6 kg)  05/01/18 216 lb (98 kg)  04/14/18 214 lb (97.1 kg)    Gen: Well NAD HEENT: EOMI,  MMM Lungs: Normal work of breathing. CTABL Heart: RRR no MRG Abd: NABS, Soft. Nondistended, Nontender Exts: Brisk capillary refill, warm and well perfused.  Skin: Small macular ecchymosis  right forearm. L-spine normal motion normal strength.  Normal gait.  Lab and Radiology Results Recent Results (from the past 2160 hour(s))  CBC     Status: None   Collection Time: 09/26/18  8:46 AM  Result Value Ref Range   WBC 6.9 3.8 - 10.8 Thousand/uL   RBC 5.55 4.20 - 5.80 Million/uL   Hemoglobin 16.1 13.2 - 17.1 g/dL   HCT 48.6 38.5 - 50.0 %   MCV 87.6 80.0 - 100.0 fL   MCH 29.0 27.0 - 33.0 pg   MCHC 33.1 32.0 - 36.0 g/dL   RDW 14.4 11.0 - 15.0 %   Platelets 250 140 - 400 Thousand/uL   MPV 11.3 7.5 - 12.5 fL  COMPLETE METABOLIC PANEL WITH GFR     Status: None   Collection Time: 09/26/18  8:46 AM  Result Value Ref Range   Glucose, Bld 91 65 - 99 mg/dL    Comment: .            Fasting reference interval .    BUN 23 7 - 25 mg/dL   Creat 1.10 0.70 - 1.25 mg/dL    Comment: For patients >52 years of age, the reference limit for Creatinine is approximately 13% higher for people identified as African-American. .    GFR, Est Non African American 72 > OR = 60 mL/min/1.74m2   GFR, Est African American 84 > OR = 60 mL/min/1.27m2  BUN/Creatinine Ratio NOT APPLICABLE 6 - 22 (calc)   Sodium 139 135 - 146 mmol/L   Potassium 4.1 3.5 - 5.3 mmol/L   Chloride 102 98 - 110 mmol/L   CO2 25 20 - 32 mmol/L   Calcium 10.0 8.6 - 10.3 mg/dL   Total Protein 7.2 6.1 - 8.1 g/dL   Albumin 4.3 3.6 - 5.1 g/dL   Globulin 2.9 1.9 - 3.7 g/dL (calc)   AG Ratio 1.5 1.0 - 2.5 (calc)   Total Bilirubin 0.4 0.2 - 1.2 mg/dL   Alkaline phosphatase (APISO) 55 35 - 144 U/L   AST 14 10 - 35 U/L   ALT 27 9 - 46 U/L  PSA     Status: None   Collection Time: 09/26/18  8:46 AM  Result Value Ref Range   PSA <0.1 < OR = 4.0 ng/mL    Comment: The total PSA value from this assay system is  standardized against the WHO standard. The test  result will be approximately 20% lower when compared  to the equimolar-standardized total PSA (Beckman  Coulter). Comparison of serial PSA results should be  interpreted  with this fact in mind. . This test was performed using the Siemens  chemiluminescent method. Values obtained from  different assay methods cannot be used interchangeably. PSA levels, regardless of value, should not be interpreted as absolute evidence of the presence or absence of disease.   Hepatitis C antibody     Status: None   Collection Time: 09/26/18  8:46 AM  Result Value Ref Range   Hepatitis C Ab NON-REACTIVE NON-REACTI   SIGNAL TO CUT-OFF 0.01 <1.00    Comment: . HCV antibody was non-reactive. There is no laboratory  evidence of HCV infection. . In most cases, no further action is required. However, if recent HCV exposure is suspected, a test for HCV RNA (test code [phone]) is suggested. . For additional information please refer to http://education.questdiagnostics.com/faq/FAQ22v1 (This link is being provided for informational/ educational purposes only.) .   Direct LDL     Status: Abnormal   Collection Time: 09/26/18  8:46 AM  Result Value Ref Range   Direct LDL 130 (H) <100 mg/dL    Comment: Greatly elevated Triglycerides values (>1200 mg/dL) interfere with the dLDL assay. As no Triglycerides  testing was ordered, interpret results with caution. . Desirable range <100 mg/dL for primary prevention;   <70 mg/dL for patients with CHD or diabetic patients  with > or = 2 CHD risk factors. .    Dg Lumbar Spine Complete  Result Date: 08/22/2018 CLINICAL DATA:  Chronic low back pain EXAM: LUMBAR SPINE - COMPLETE 4+ VIEW COMPARISON:  None. FINDINGS: Five lumbar type vertebral bodies are well visualized. Vertebral body height is well maintained. No pars defects are seen. Disc space narrowing at L4-5 and L5-S1 is seen. Multilevel osteophytic changes are noted. No soft tissue abnormality is seen. No anterolisthesis is noted. IMPRESSION: Degenerative change without acute abnormality. Electronically Signed   By: Inez Catalina M.D.   On: 08/22/2018 08:37   Mr Lumbar Spine Wo  Contrast  Result Date: 08/27/2018 CLINICAL DATA:  Chronic low back pain and right leg pain, worsening recently. EXAM: MRI LUMBAR SPINE WITHOUT CONTRAST TECHNIQUE: Multiplanar, multisequence MR imaging of the lumbar spine was performed. No intravenous contrast was administered. COMPARISON:  Radiography 08/22/2018 FINDINGS: Segmentation:  5 lumbar type vertebral bodies. Alignment:  Normal Vertebrae:  No fracture or primary bone lesion. Conus medullaris and cauda equina: Conus extends to  the L1 level. Conus and cauda equina appear normal. Paraspinal and other soft tissues: Negative Disc levels: T12-L1, L1-2 and L2-3: Minimal, non-compressive disc bulges. L3-4: Mild bulging of the disc with slight focal prominence in the right lateral foraminal to extraforaminal region. No apparent neural compression however. Some potential the right L3 nerve could be irritated. L4-5: Circumferential bulging of the disc. Mild facet and ligamentous hypertrophy. Stenosis of both lateral recesses that could affect either L5 nerve. Mild bilateral foraminal narrowing. L5-S1: Chronic disc degeneration with endplate osteophytes and bulging of the disc more prominent towards the left. Mild narrowing of the subarticular lateral recess and intervertebral foramen on the left but without visible neural compression. No right-sided stenosis. IMPRESSION: Chronic lower lumbar degenerative changes without evidence of acute finding. L3-4: Circumferential bulging of the disc, focally prominent in the right lateral foraminal to extraforaminal region. This is adjacent to the right L3 nerve. Definite nerve compression is not seen, but the right L3 nerve could be irritated. L4-5: Circumferential bulging of the disc. Mild facet hypertrophy. Stenosis of both lateral recesses that would have potential to affect either or both L5 nerves. Mild bilateral foraminal narrowing as well. L5-S1: Left posterolateral predominant osteophyte and bulging disc. Mild stenosis  of the subarticular lateral recess and intervertebral foramen on the left, but no definite neural compression. Electronically Signed   By: Nelson Chimes M.D.   On: 08/27/2018 09:55   Dg Inject Diag/thera/inc Needle/cath/plc Epi/lumb/sac W/img  Result Date: 09/14/2018 CLINICAL DATA:  Spondylosis without myelopathy. Right L5 radicular symptoms. FLUOROSCOPY TIME:  0 minutes 24 seconds. 26.96 micro gray meter squared PROCEDURE: The procedure, risks, benefits, and alternatives were explained to the patient. Questions regarding the procedure were encouraged and answered. The patient understands and consents to the procedure. LUMBAR EPIDURAL INJECTION: An interlaminar approach was performed on the right at L4-5. The overlying skin was cleansed and anesthetized. A 20 gauge epidural needle was advanced using loss-of-resistance technique. DIAGNOSTIC EPIDURAL INJECTION: Injection of Isovue-M 200 shows a good epidural pattern with spread above and below the level of needle placement, primarily on the right. No vascular opacification is seen. THERAPEUTIC EPIDURAL INJECTION: One hundred twenty mg of Depo-Medrol mixed with 2 cc 1% lidocaine were instilled. The procedure was well-tolerated, and the patient was discharged thirty minutes following the injection in good condition. COMPLICATIONS: None IMPRESSION: Technically successful epidural injection on the right at L4-5 # 1. Electronically Signed   By: Nelson Chimes M.D.   On: 09/14/2018 13:57   I personally (independently) visualized and performed the interpretation of the images attached in this note.   Assessment and Plan: 63 y.o. male with  Lumbar radiculopathy.  Persistent right L5.  Plan for repeat epidural steroid injection.  This time we will try transforaminal approach.  Recheck as needed.  Limited tramadol refilled.  Urinary incontinence: Improved with oxybutynin.  Plan to continue oxybutynin.  Reasonable to follow back up with urology.  Hyperlipidemia: LDL  elevated.  Plan to start atorvastatin.  Will get labs in about 6 to 12 weeks.  Flu and pneumococcal (Pneumovax 23 ) vaccine given today. PDMP reviewed during this encounter. Orders Placed This Encounter  Procedures   DG INJECT DIAG/THERA/INC NEEDLE/CATH/PLC EPI/LUMB/SAC W/IMG    Standing Status:   Future    Standing Expiration Date:   12/31/2019    Order Specific Question:   Reason for Exam (SYMPTOM  OR DIAGNOSIS REQUIRED)    Answer:   right L4-5 transformaninal for L5 radiculopathy. May sub interlaminal if  needed    Order Specific Question:   Preferred Imaging Location?    Answer:   [facility]    Order Specific Question:   Radiology Contrast Protocol - do NOT remove file path    Answer:   \[email or web address]   Flu Vaccine QUAD 36+ mos IM   Pneumococcal polysaccharide vaccine 23-valent greater than or equal to 2yo subcutaneous/IM   COMPLETE METABOLIC PANEL WITH GFR   Lipid Panel w/reflex Direct LDL   Meds ordered this encounter  Medications   traMADol (ULTRAM) 50 MG tablet    Sig: Take 1 tablet (50 mg total) by mouth every 8 (eight) hours as needed for severe pain.    Dispense:  15 tablet    Refill:  0   oxybutynin (DITROPAN) 5 MG tablet    Sig: Take 1 tablet (5 mg total) by mouth at bedtime as needed for bladder spasms.    Dispense:  90 tablet    Refill:  3   atorvastatin (LIPITOR) 10 MG tablet    Sig: Take 1 tablet (10 mg total) by mouth daily.    Dispense:  90 tablet    Refill:  3     Historical information moved to improve visibility of documentation.  Past Medical History:  Diagnosis Date   Cancer [facility])    Chronic cough 09/19/2017   Influenza    Pneumonia    Prostate cancer ([place]) 02/21/2014   4+4=8 s/p TRUS biopsy by Dr. Elnoria Howard on 01/18/14  Last Assessment & Plan:   Patient be managed by urology.  Patient is status post prostatectomy and radiation therapy.  Denies hematuria.   Sinus tachycardia 09/13/2017     Stress incontinence 04/10/2014   Past Surgical History:  Procedure Laterality Date   ABDOMINAL SURGERY     PROSTATE SURGERY     Prostatectomy robot. Dr Tommi Rumps and Dr Redmond Pulling   TONSILLECTOMY     Social History   Tobacco Use   Smoking status: Former Smoker    Packs/day: 1.00    Years: 13.00    Pack years: 13.00    Types: Cigarettes    Start date: 02/08/1974    Quit date: 10/02/1988    Years since quitting: 30.0   Smokeless tobacco: Never Used  Substance Use Topics   Alcohol use: Yes   family history includes Breast cancer in an other family member; Diabetes in an other family member.  Medications: Current Outpatient Medications  Medication Sig Dispense Refill   traMADol (ULTRAM) 50 MG tablet Take 1 tablet (50 mg total) by mouth every 8 (eight) hours as needed for severe pain. 15 tablet 0   atorvastatin (LIPITOR) 10 MG tablet Take 1 tablet (10 mg total) by mouth daily. 90 tablet 3   metoprolol succinate (TOPROL-XL) 50 MG 24 hr tablet Take 1 tablet (50 mg total) by mouth daily. Take with or immediately following a meal. (Patient not taking: Reported on 10/31/2018) 90 tablet 3   oxybutynin (DITROPAN) 5 MG tablet Take 1 tablet (5 mg total) by mouth at bedtime as needed for bladder spasms. 90 tablet 3   No current facility-administered medications for this visit.    Allergies  Allergen Reactions   Amoxicillin Diarrhea    Stomach cramps     Discussed warning signs or symptoms. Please see discharge instructions. Patient expresses understanding.

## 2018-10-31 NOTE — Patient Instructions (Addendum)
Thank you for coming in today. Please follow up with your urologist.  You should hear about back injection soon.  Start cholesterol medicine.  We will recheck labs in 6-12 weeks. I will order the lab and send a reminder to get it done fasting.  Return

## 2018-11-02 ENCOUNTER — Telehealth: Payer: Self-pay

## 2018-11-02 NOTE — Telephone Encounter (Signed)
Okay to use over-the-counter hydrocortisone cream as needed for rash.  Return for recheck if worsening or not improving.  Rash is likely the immune system reaction to the vaccine which is a good thing meaning that you can to be more protected against pneumonia.

## 2018-11-02 NOTE — Telephone Encounter (Signed)
Pt was seen on Tuesday and was given a flu shot in the RD and a pneumonia 23 in the LD.  Pt wife left vm reporting rash at the injection on the LD were the pneumonia shot was given.  She said that he has not complained of itching or soreness at the site. Please advise. -EH/RMA

## 2018-11-02 NOTE — Telephone Encounter (Signed)
Recommendations left on vm -EH/RMA  

## 2018-11-14 ENCOUNTER — Ambulatory Visit
Admission: RE | Admit: 2018-11-14 | Discharge: 2018-11-14 | Disposition: A | Discharge status: Home / Self Care | Payer: Managed Care, Other (non HMO) | Source: Ambulatory Visit | Attending: Family Medicine | Admitting: Family Medicine

## 2018-11-14 DIAGNOSIS — M5416 Radiculopathy, lumbar region: Secondary | ICD-10-CM

## 2018-11-14 MED ORDER — METHYLPREDNISOLONE ACETATE 40 MG/ML INJ SUSP (RADIOLOG
120.0000 mg | Freq: Once | INTRAMUSCULAR | Status: AC
  Administered 2018-11-14: 120 mg via EPIDURAL

## 2018-11-14 MED ORDER — IOPAMIDOL (ISOVUE-M 200) INJECTION 41%
1.0000 mL | Freq: Once | INTRAMUSCULAR | Status: AC
  Administered 2018-11-14: 11:00:00 1 mL via EPIDURAL

## 2018-11-14 NOTE — Discharge Instructions (Signed)

## 2018-12-08 ENCOUNTER — Telehealth: Payer: Self-pay | Admitting: Family Medicine

## 2018-12-08 NOTE — Telephone Encounter (Signed)
Left a message asking for a return call 

## 2018-12-08 NOTE — Telephone Encounter (Signed)
-----   Message from Gregor Hams, MD sent at 10/31/2018 12:03 PM EDT ----- Regarding: Remind patient to get labs Ordered lipid panel and metabolic panel.  Patient should get fasting labs now to follow-up Lipitor

## 2018-12-08 NOTE — Telephone Encounter (Signed)
Patient should get fasting labs done to check effectiveness and safety of atorvastatin.  Labs have already been ordered and should be done fasting in the near future.  Can swing by the lab and get them done.

## 2018-12-11 ENCOUNTER — Ambulatory Visit: Payer: Managed Care, Other (non HMO) | Admitting: Sports Medicine

## 2018-12-22 NOTE — Telephone Encounter (Signed)
Left pt msg to get labs done

## 2019-01-08 ENCOUNTER — Other Ambulatory Visit: Payer: Self-pay

## 2019-01-08 ENCOUNTER — Telehealth: Payer: Self-pay | Admitting: Sports Medicine

## 2019-01-08 ENCOUNTER — Encounter: Payer: Self-pay | Admitting: Sports Medicine

## 2019-01-08 ENCOUNTER — Ambulatory Visit (INDEPENDENT_AMBULATORY_CARE_PROVIDER_SITE_OTHER): Payer: Managed Care, Other (non HMO) | Admitting: Sports Medicine

## 2019-01-08 DIAGNOSIS — M5416 Radiculopathy, lumbar region: Secondary | ICD-10-CM

## 2019-01-08 MED ORDER — AMITRIPTYLINE HCL 50 MG PO TABS: ORAL_TABLET | ORAL | 3 refills | Status: DC

## 2019-01-08 MED ORDER — TRAMADOL HCL 50 MG PO TABS: 50.0000 mg | ORAL_TABLET | Freq: Three times a day (TID) | ORAL | 0 refills | Status: DC | PRN

## 2019-01-08 MED ORDER — GABAPENTIN 300 MG PO CAPS: ORAL_CAPSULE | ORAL | 3 refills | Status: DC

## 2019-01-08 NOTE — Telephone Encounter (Signed)
Switching to amitriptyline low-dose at night, also adding the injection.

## 2019-01-08 NOTE — Telephone Encounter (Signed)
PT requested a new method of treatment for back pain. Stated,"Ive tried gabapentin and it doesn't work."He suggested getting a referral for a "shot"  Please Advise.

## 2019-01-08 NOTE — Progress Notes (Addendum)
Subjective:    CC: Low back pain  HPI: Harold Edwards is a pleasant 63 year old male, he has been treated by Dr. Georgina Snell for right lumbar radiculitis, going to the big toe, i.e. L4 distribution.  He has had 2 epidurals, the first was interlaminar at L4-L5 and provided moderate relief, the second was transforaminal at L4-L5 on the right providing excellent relief.  He was doing well until picking up his 61 pound 78-month-old granddaughter and he hurt his back again.  He is interested in more of a conservative approach at this point, no bowel or bladder dysfunction, saddle numbness, constitutional symptoms.  No progressive weakness.  I reviewed the past medical history, family history, social history, surgical history, and allergies today and no changes were needed.  Please see the problem list section below in epic for further details.  Past Medical History: Past Medical History:  Diagnosis Date  . Cancer ([place])   . Chronic cough 09/19/2017  . Influenza   . Pneumonia   . Prostate cancer ([place]) 02/21/2014   4+4=8 s/p TRUS biopsy by Dr. Elnoria Howard on 01/18/14  Last Assessment & Plan:   Patient be managed by urology.  Patient is status post prostatectomy and radiation therapy.  Denies hematuria.  . Sinus tachycardia 09/13/2017  . Stress incontinence 04/10/2014   Past Surgical History: Past Surgical History:  Procedure Laterality Date  . ABDOMINAL SURGERY    . PROSTATE SURGERY     Prostatectomy robot. Dr Tommi Rumps and Dr Redmond Pulling  . TONSILLECTOMY     Social History: Social History   Socioeconomic History  . Marital status: Married    Spouse name: Not on file  . Number of children: Not on file  . Years of education: Not on file  . Highest education level: Not on file  Occupational History  . Not on file  Social Needs  . Financial resource strain: Not on file  . Food insecurity    Worry: Not on file    Inability: Not on file  . Transportation needs    Medical: Not on file    Non-medical: Not on file   Tobacco Use  . Smoking status: Former Smoker    Packs/day: 1.00    Years: 13.00    Pack years: 13.00    Types: Cigarettes    Start date: 02/08/1974    Quit date: 10/02/1988    Years since quitting: 30.2  . Smokeless tobacco: Never Used  Substance and Sexual Activity  . Alcohol use: Yes  . Drug use: Never  . Sexual activity: Not Currently    Birth control/protection: None  Lifestyle  . Physical activity    Days per week: Not on file    Minutes per session: Not on file  . Stress: Not on file  Relationships  . Social Herbalist on phone: Not on file    Gets together: Not on file    Attends religious service: Not on file    Active member of club or organization: Not on file    Attends meetings of clubs or organizations: Not on file    Relationship status: Not on file  Other Topics Concern  . Not on file  Social History Narrative  . Not on file   Family History: Family History  Problem Relation Age of Onset  . Breast cancer Other   . Diabetes Other    Allergies: Allergies  Allergen Reactions  . Amoxicillin Diarrhea    Stomach cramps   Medications: See med  rec.  Review of Systems: No fevers, chills, night sweats, weight loss, chest pain, or shortness of breath.   Objective:    General: Well Developed, well nourished, and in no acute distress.  Neuro: Alert and oriented x3, extra-ocular muscles intact, sensation grossly intact.  HEENT: Normocephalic, atraumatic, pupils equal round reactive to light, neck supple, no masses, no lymphadenopathy, thyroid nonpalpable.  Skin: Warm and dry, no rashes. Cardiac: Regular rate and rhythm, no murmurs rubs or gallops, no lower extremity edema.  Respiratory: Clear to auscultation bilaterally. Not using accessory muscles, speaking in full sentences.  I did personally review his MRI, he has multilevel degenerative disc disease with a clear large broad-based L4-L5 disc protrusion contacting the right extraforaminal L4 nerve  root.  Impression and Recommendations:    Lumbar back pain with radiculopathy affecting right lower extremity Right L4 distribution radiculitis, initial epidural was interlaminar with good relief, second epidural was transforaminal with excellent relief. He was doing well until picking up his granddaughter, he does desire to avoid intervention for now so refilling tramadol and adding gabapentin in an up taper. Return to see me in 3 to 4 weeks and if insufficient improvement we will proceed with right L4-L5 transforaminal epidural #3. He does continue to desire to avoid operative intervention.  Patient calls back, he said he tried gabapentin and would like to try something else, also would like to now try epidural #3.   ___________________________________________ Gwen Her. Dianah Field, M.D., ABFM., CAQSM. Primary Care and Sports Medicine [facility]  Adjunct Professor of [employer] [facility] [employer]

## 2019-01-08 NOTE — Addendum Note (Signed)
Addended by: Silverio Decamp on: 01/08/2019 09:33 AM   Modules accepted: Orders

## 2019-01-08 NOTE — Assessment & Plan Note (Addendum)
Right L4 distribution radiculitis, initial epidural was interlaminar with good relief, second epidural was transforaminal with excellent relief. He was doing well until picking up his granddaughter, he does desire to avoid intervention for now so refilling tramadol and adding gabapentin in an up taper. Return to see me in 3 to 4 weeks and if insufficient improvement we will proceed with right L4-L5 transforaminal epidural #3. He does continue to desire to avoid operative intervention.  Patient calls back, he said he tried gabapentin and would like to try something else, also would like to now try epidural #3.

## 2019-01-18 NOTE — Telephone Encounter (Signed)
Rx has been picked up by pt.

## 2019-02-12 ENCOUNTER — Ambulatory Visit (INDEPENDENT_AMBULATORY_CARE_PROVIDER_SITE_OTHER): Payer: Managed Care, Other (non HMO) | Admitting: Sports Medicine

## 2019-02-12 ENCOUNTER — Other Ambulatory Visit: Payer: Self-pay

## 2019-02-12 ENCOUNTER — Encounter: Payer: Self-pay | Admitting: Sports Medicine

## 2019-02-12 DIAGNOSIS — M5416 Radiculopathy, lumbar region: Secondary | ICD-10-CM | POA: Diagnosis not present

## 2019-02-12 DIAGNOSIS — R000 Tachycardia, unspecified: Secondary | ICD-10-CM

## 2019-02-12 NOTE — Assessment & Plan Note (Signed)
Worsening tachycardia today, without symptoms, he is not experiencing chest pain, shortness of breath, he is also not taking his metoprolol. He needs to follow-up with a PCP for this.

## 2019-02-12 NOTE — Progress Notes (Addendum)
    Procedures performed today:    None.  Independent interpretation of test performed by another provider:   I personally reviewed his lumbar spine MRI, he does have a broad-based L4-L5 disc protrusion causing mild central canal stenosis and biforaminal stenosis.  Impression and Recommendations:    I have performed independent interpretation of the relevant labs and imaging ordered by this patient's other providers.  Lumbar back pain with radiculopathy affecting right lower extremity Chronic low back pain, related to lumbar spinal stenosis distribution with right-sided radiculopathy, worsening/progression. He did start gabapentin, he had good relief it twice a day but experienced some constipation he felt was related to gabapentin and stopped it. Certainly symptoms worsened, restarting gabapentin in a slow up taper. I am again recommending that he have an epidural, this is considered minor surgery with minor risks. Discontinue amitriptyline, this is the likely cause of his constipation. Return to see me 1 month after the injection.  Sinus tachycardia Worsening tachycardia today, without symptoms, he is not experiencing chest pain, shortness of breath, he is also not taking his metoprolol. He needs to follow-up with a PCP for this.    ___________________________________________ Gwen Her. Dianah Field, M.D., ABFM., CAQSM. Primary Care and [place] Instructor of [employer] [facility] of Medicine

## 2019-02-12 NOTE — Assessment & Plan Note (Addendum)
Chronic low back pain, related to lumbar spinal stenosis distribution with right-sided radiculopathy, worsening/progression. He did start gabapentin, he had good relief it twice a day but experienced some constipation he felt was related to gabapentin and stopped it. Certainly symptoms worsened, restarting gabapentin in a slow up taper. I am again recommending that he have an epidural, this is considered minor surgery with minor risks. Discontinue amitriptyline, this is the likely cause of his constipation. Return to see me 1 month after the injection.

## 2019-02-14 ENCOUNTER — Encounter: Payer: Self-pay | Admitting: Physician Assistant

## 2019-02-14 ENCOUNTER — Ambulatory Visit (INDEPENDENT_AMBULATORY_CARE_PROVIDER_SITE_OTHER): Payer: Managed Care, Other (non HMO) | Admitting: Physician Assistant

## 2019-02-14 ENCOUNTER — Other Ambulatory Visit: Payer: Self-pay

## 2019-02-14 VITALS — BP 139/93 | HR 102 | Ht 73.0 in | Wt 215.0 lb

## 2019-02-14 DIAGNOSIS — E782 Mixed hyperlipidemia: Secondary | ICD-10-CM | POA: Diagnosis not present

## 2019-02-14 DIAGNOSIS — Z5320 Procedure and treatment not carried out because of patient's decision for unspecified reasons: Secondary | ICD-10-CM | POA: Diagnosis not present

## 2019-02-14 DIAGNOSIS — R000 Tachycardia, unspecified: Secondary | ICD-10-CM

## 2019-02-14 DIAGNOSIS — Z131 Encounter for screening for diabetes mellitus: Secondary | ICD-10-CM

## 2019-02-14 MED ORDER — METOPROLOL SUCCINATE ER 50 MG PO TB24: 50.0000 mg | ORAL_TABLET | Freq: Every day | ORAL | 3 refills | Status: DC

## 2019-02-14 NOTE — Progress Notes (Signed)
Subjective:    Patient ID: Harold Edwards, male    DOB: September 01, 1955, 64 y.o.   MRN: [id number]  HPI  Pt is a 64 yo male with history of sinus tachycardia who presents to the clinic to discuss medication refills. He was seen by Dr. Darene Lamer for lumbar back pain and pulse was 132. Dr. Darene Lamer suggest PCP visit. His PCP left clinic and he is seeing me today. He was not on metoprolol at his visit with Dr. Darene Lamer. He admits to not taking it daily. He restarted some he had at home and pulse is improving. Pt has no CP, palpitations, headaches, vision changes. He no longer smokes. He has no family hx of cardiac disease. He does have a hx of elevated cholesterol. No recent labs.   He is in remission from prostate cancer.    Family History  Problem Relation Age of Onset  . Breast cancer Other   . Diabetes Other    .[name] Active Ambulatory Problems    Diagnosis Date Noted  . Chronic cough 09/19/2017  . ED (erectile dysfunction) 04/10/2014  . Medication side effects 09/13/2017  . Mixed hyperlipidemia 09/19/2017  . History of prostate cancer 02/21/2014  . S/P prostatectomy 03/18/2014  . Stress incontinence 04/10/2014  . Sinus tachycardia 09/13/2017  . 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 Former smoker, stopped smoking in distant past 04/20/2018  . Senile purpura ([place]) 10/31/2018   Resolved Ambulatory Problems    Diagnosis Date Noted  . No Resolved Ambulatory Problems   Past Medical History:  Diagnosis Date  . Cancer ([place])   . Influenza   . Pneumonia   . Prostate cancer ([place]) 02/21/2014     Review of Systems  All other systems reviewed and are negative.      Objective:   Physical Exam Vitals reviewed.  Constitutional:      Appearance: Normal appearance.  HENT:     Head: Normocephalic.  Cardiovascular:     Rate and Rhythm: Normal rate and regular rhythm.     Pulses: Normal pulses.  Pulmonary:     Effort: Pulmonary effort is normal.     Breath sounds:  Normal breath sounds.  Neurological:     General: No focal deficit present.     Mental Status: He is alert and oriented to person, place, and time.  Psychiatric:        Mood and Affect: Mood normal.           Assessment & Plan:   [name] KitchenMarland KitchenJomo was seen today for tachycardia.  Diagnoses and all orders for this visit:  Sinus tachycardia -     COMPLETE METABOLIC PANEL WITH GFR -     TSH -     CBC -     metoprolol succinate (TOPROL-XL) 50 MG 24 hr tablet; Take 1 tablet (50 mg total) by mouth daily. Take with or immediately following a meal. -     EKG 12-Lead  Mixed hyperlipidemia -     Lipid Panel w/reflex Direct LDL  Screening for diabetes mellitus -     COMPLETE METABOLIC PANEL WITH GFR  Colon cancer screening declined   Patient is asymptomatic today however he is over 50 does not have a baseline EKG.  EKG was obtained today.  No acute changes.  No signs of ST elevation or arrhythmia.  Discussed importance of compliance with metoprolol.  If his heart rate is getting in the 130s he has the risk of developing  a arrhythmia which can be life-threatening.  Patient voices understanding.  Patient has not had labs in a while.  We will get some baseline labs to assess further cardiovascular risk.  Did not see a colonoscopy on file.  Per patient he says all he has had that done somewhere.  I asked where he was unable to provide that information.  He declined any referrals or orders today.  Patient is aware of the risk of not having regular screening colonoscopies.  Pt is managed by urology for prostate cancer remission.   Follow up in 6 month for BP and HR check.

## 2019-02-14 NOTE — Patient Instructions (Signed)
Sinus Tachycardia  Sinus tachycardia is a kind of fast heartbeat. In sinus tachycardia, the heart beats more than 100 times a minute. Sinus tachycardia starts in a part of the heart called the sinus node. Sinus tachycardia may be harmless, or it may be a sign of a serious condition. What are the causes? This condition may be caused by:  Exercise or exertion.  A fever.  Pain.  Loss of body fluids (dehydration).  Severe bleeding (hemorrhage).  Anxiety and stress.  Certain substances, including: ? Alcohol. ? Caffeine. ? Tobacco and nicotine products. ? Cold medicines. ? Illegal drugs.  Medical conditions including: ? Heart disease. ? An infection. ? An overactive thyroid (hyperthyroidism). ? A lack of red blood cells (anemia). What are the signs or symptoms? Symptoms of this condition include:  A feeling that the heart is beating quickly (palpitations).  Suddenly noticing your heartbeat (cardiac awareness).  Dizziness.  Tiredness (fatigue).  Shortness of breath.  Chest pain.  Nausea.  Fainting. How is this diagnosed? This condition is diagnosed with:  A physical exam.  Other tests, such as: ? Blood tests. ? An electrocardiogram (ECG). This test measures the electrical activity of the heart. ? Ambulatory cardiac monitor. This records your heartbeats for 24 hours or more. You may be referred to a heart specialist (cardiologist). How is this treated? Treatment for this condition depends on the cause or the underlying condition. Treatment may involve:  Treating the underlying condition.  Taking new medicines or changing your current medicines as told by your health care provider.  Making changes to your diet or lifestyle. Follow these instructions at home: Lifestyle   Do not use any products that contain nicotine or tobacco, such as cigarettes and e-cigarettes. If you need help quitting, ask your health care provider.  Do not use illegal drugs, such as  cocaine.  Learn relaxation methods to help you when you get stressed or anxious. These include deep breathing.  Avoid caffeine or other stimulants. Alcohol use   Do not drink alcohol if: ? Your health care provider tells you not to drink. ? You are pregnant, may be pregnant, or are planning to become pregnant.  If you drink alcohol, limit how much you have: ? 0-1 drink a day for women. ? 0-2 drinks a day for men.  Be aware of how much alcohol is in your drink. In the [place] one drink equals one typical bottle of beer (12 oz), one-half glass of wine (5 oz), or one shot of hard liquor (1 oz). General instructions  Drink enough fluids to keep your urine pale yellow.  Take over-the-counter and prescription medicines only as told by your health care provider.  Keep all follow-up visits as told by your health care provider. This is important. Contact a health care provider if you have:  A fever.  Vomiting or diarrhea that does not go away. Get help right away if you:  Have pain in your chest, upper arms, jaw, or neck.  Become weak or dizzy.  Feel faint.  Have palpitations that do not go away. Summary  In sinus tachycardia, the heart beats more than 100 times a minute.  Sinus tachycardia may be harmless, or it may be a sign of a serious condition.  Treatment for this condition depends on the cause or the underlying condition.  Get help right away if you have pain in your chest, upper arms, jaw, or neck. This information is not intended to replace advice given to you by   your health care provider. Make sure you discuss any questions you have with your health care provider. Document Revised: 03/16/2017 Document Reviewed: 03/16/2017 Elsevier Patient Education  2020 Elsevier Inc.  

## 2019-02-15 ENCOUNTER — Encounter: Payer: Self-pay | Admitting: Physician Assistant

## 2019-02-16 ENCOUNTER — Other Ambulatory Visit: Payer: Managed Care, Other (non HMO)

## 2019-02-20 ENCOUNTER — Ambulatory Visit
Admission: RE | Admit: 2019-02-20 | Discharge: 2019-02-20 | Disposition: A | Discharge status: Home / Self Care | Payer: Managed Care, Other (non HMO) | Source: Ambulatory Visit | Attending: Sports Medicine | Admitting: Sports Medicine

## 2019-02-20 ENCOUNTER — Other Ambulatory Visit: Payer: Self-pay

## 2019-02-20 MED ORDER — METHYLPREDNISOLONE ACETATE 40 MG/ML INJ SUSP (RADIOLOG
120.0000 mg | Freq: Once | INTRAMUSCULAR | Status: AC
  Administered 2019-02-20: 120 mg via EPIDURAL

## 2019-02-20 MED ORDER — IOPAMIDOL (ISOVUE-M 200) INJECTION 41%
1.0000 mL | Freq: Once | INTRAMUSCULAR | Status: AC
  Administered 2019-02-20: 1 mL via EPIDURAL

## 2019-02-20 NOTE — Discharge Instructions (Signed)

## 2019-03-13 ENCOUNTER — Ambulatory Visit (INDEPENDENT_AMBULATORY_CARE_PROVIDER_SITE_OTHER): Payer: Managed Care, Other (non HMO) | Admitting: Sports Medicine

## 2019-03-13 ENCOUNTER — Other Ambulatory Visit: Payer: Self-pay

## 2019-03-13 DIAGNOSIS — M5416 Radiculopathy, lumbar region: Secondary | ICD-10-CM

## 2019-03-13 NOTE — Assessment & Plan Note (Signed)
Harold Edwards has chronic low back pain related to lumbar spinal stenosis with right-sided radiculopathy to the great toe. He has now had 3 epidurals, the most recent of which was a right L4-L5 transforaminal. He noted fantastic relief initially after the epidural but is starting to have some pain creeping back. He has recently restarted his gabapentin which is starting to help. He will continue a slow up taper, continue off of amitriptyline which caused excessive constipation. I like to see him back in a [facility] visit in 1 month to reevaluate. We will give gabapentin the due diligence of ramping up to the max tolerable dose before considering the addition of an NSAID or tramadol. He is not interested in surgical intervention though after 3 epidurals he is a candidate.

## 2019-03-13 NOTE — Progress Notes (Signed)
    Procedures performed today:    None.  Independent interpretation of tests performed by another provider:   None.  Impression and Recommendations:    Lumbar back pain with radiculopathy affecting right lower extremity Harold Edwards has chronic low back pain related to lumbar spinal stenosis with right-sided radiculopathy to the great toe. He has now had 3 epidurals, the most recent of which was a right L4-L5 transforaminal. He noted fantastic relief initially after the epidural but is starting to have some pain creeping back. He has recently restarted his gabapentin which is starting to help. He will continue a slow up taper, continue off of amitriptyline which caused excessive constipation. I like to see him back in a [facility] visit in 1 month to reevaluate. We will give gabapentin the due diligence of ramping up to the max tolerable dose before considering the addition of an NSAID or tramadol. He is not interested in surgical intervention though after 3 epidurals he is a candidate.    ___________________________________________ Gwen Her. Dianah Field, M.D., ABFM., CAQSM. Primary Care and [place] Instructor of [place] [employer] [facility]

## 2019-03-26 ENCOUNTER — Telehealth (INDEPENDENT_AMBULATORY_CARE_PROVIDER_SITE_OTHER): Payer: Managed Care, Other (non HMO) | Admitting: Physician Assistant

## 2019-03-26 ENCOUNTER — Other Ambulatory Visit: Payer: Self-pay

## 2019-03-26 ENCOUNTER — Encounter: Payer: Self-pay | Admitting: Physician Assistant

## 2019-03-26 VITALS — Temp 97.7°F | Ht 73.0 in | Wt 215.0 lb

## 2019-03-26 DIAGNOSIS — J40 Bronchitis, not specified as acute or chronic: Secondary | ICD-10-CM

## 2019-03-26 DIAGNOSIS — J329 Chronic sinusitis, unspecified: Secondary | ICD-10-CM

## 2019-03-26 MED ORDER — AZITHROMYCIN 250 MG PO TABS: ORAL_TABLET | ORAL | 0 refills | Status: DC

## 2019-03-26 NOTE — Progress Notes (Signed)
Patient ID: Harold Edwards, male   DOB: Jun 14, 1955, 64 y.o.   MRN: [id number] .Harold KitchenVirtual Visit via Video Note  I connected with [name] on 03/26/19 at 10:10 AM EST by a video enabled telemedicine application and verified that I am speaking with the correct person using two identifiers.  Location: Patient: home Provider: clinic   I discussed the limitations of evaluation and management by telemedicine and the availability of in person appointments. The patient expressed understanding and agreed to proceed.  History of Present Illness: Pt is a 64 yo male who calls into the clinic with 6 days of right ear pain and pressure, ST, runny nose, coughing up green mucus, head pressure. His granddaugther had similar symptoms. Denies any loss of smell or taste, GI symptoms, fever, chills, body aches, SOB. Tried mucinex, ibuprofen, dayquil with little relief.   .. Active Ambulatory Problems    Diagnosis Date Noted  . Chronic cough 09/19/2017  . ED (erectile dysfunction) 04/10/2014  . Medication side effects 09/13/2017  . Mixed hyperlipidemia 09/19/2017  . History of prostate cancer 02/21/2014  . S/P prostatectomy 03/18/2014  . Stress incontinence 04/10/2014  . Sinus tachycardia 09/13/2017  . 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 Former smoker, stopped smoking in distant past 04/20/2018  . Senile purpura ([place]) 10/31/2018   Resolved Ambulatory Problems    Diagnosis Date Noted  . No Resolved Ambulatory Problems   Past Medical History:  Diagnosis Date  . Cancer ([place])   . Influenza   . Pneumonia   . Prostate cancer ([place]) 02/21/2014   Reviewed med, allergy, problem list.    Observations/Objective: No acute distress. Cough on exam.  No labored breathing.  Normal mood.   .. Today's Vitals   03/26/19 1001  Temp: 97.7 F (36.5 C)  TempSrc: Oral  Weight: 215 lb (97.5 kg)  Height: 6\' 1"  (1.854 m)   Body mass index is 28.37  kg/m.    Assessment and Plan: Harold KitchenMarland KitchenKhali was seen today for cough.  Diagnoses and all orders for this visit:  Sinobronchitis -     azithromycin (ZITHROMAX) 250 MG tablet; Take two tablets now and then one tablet for 4 days.   Does not sound like covid infection. Stay out another day and get abx in system. If feeling significantly better ok to go back to work. If not then get covid testing done here in office. Rest and hydrate. Continue mucinex. Discussed COvID symptoms in detail.    Follow Up Instructions:    I discussed the assessment and treatment plan with the patient. The patient was provided an opportunity to ask questions and all were answered. The patient agreed with the plan and demonstrated an understanding of the instructions.   The patient was advised to call back or seek an in-person evaluation if the symptoms worsen or if the condition fails to improve as anticipated.  I provided 12 minutes of non-face-to-face time during this encounter.   Harold Planas, PA-C

## 2019-03-26 NOTE — Progress Notes (Deleted)
Symptoms started 5-6 days ago: Every time he swallows - right ear feels like trapped water Runny nose Coughing up Green mucous  Head is "clogged up" Granddaughter was over with runny nose Cough Sore throat (has gone away) No loss of taste/smell No diarrhea/nausea/vomiting  Has tried Mucinex Ibuprofen Dayquil

## 2019-03-30 ENCOUNTER — Emergency Department (INDEPENDENT_AMBULATORY_CARE_PROVIDER_SITE_OTHER)
Admission: EM | Admit: 2019-03-30 | Discharge: 2019-03-30 | Disposition: A | Discharge status: Home / Self Care | Payer: Managed Care, Other (non HMO) | Source: Home / Self Care

## 2019-03-30 ENCOUNTER — Other Ambulatory Visit: Payer: Self-pay

## 2019-03-30 ENCOUNTER — Emergency Department (INDEPENDENT_AMBULATORY_CARE_PROVIDER_SITE_OTHER): Payer: Managed Care, Other (non HMO)

## 2019-03-30 ENCOUNTER — Encounter: Payer: Self-pay | Admitting: *Deleted

## 2019-03-30 DIAGNOSIS — M5417 Radiculopathy, lumbosacral region: Secondary | ICD-10-CM | POA: Diagnosis not present

## 2019-03-30 DIAGNOSIS — M546 Pain in thoracic spine: Secondary | ICD-10-CM

## 2019-03-30 DIAGNOSIS — M5432 Sciatica, left side: Secondary | ICD-10-CM

## 2019-03-30 MED ORDER — METHYLPREDNISOLONE SODIUM SUCC 125 MG IJ SOLR
125.0000 mg | Freq: Once | INTRAMUSCULAR | Status: AC
  Administered 2019-03-30: 125 mg via INTRAMUSCULAR

## 2019-03-30 MED ORDER — ACETAMINOPHEN 500 MG PO TABS
1000.0000 mg | ORAL_TABLET | Freq: Once | ORAL | Status: AC
  Administered 2019-03-30: 13:00:00 1000 mg via ORAL

## 2019-03-30 MED ORDER — PREDNISONE 20 MG PO TABS: 20.0000 mg | ORAL_TABLET | Freq: Every day | ORAL | 0 refills | Status: DC

## 2019-03-30 MED ORDER — TRAMADOL HCL 50 MG PO TABS: 50.0000 mg | ORAL_TABLET | Freq: Four times a day (QID) | ORAL | 0 refills | Status: DC | PRN

## 2019-03-30 MED ORDER — KETOROLAC TROMETHAMINE 60 MG/2ML IM SOLN
60.0000 mg | Freq: Once | INTRAMUSCULAR | Status: AC
  Administered 2019-03-30: 13:00:00 60 mg via INTRAMUSCULAR

## 2019-03-30 MED ORDER — CYCLOBENZAPRINE HCL 10 MG PO TABS: 10.0000 mg | ORAL_TABLET | Freq: Two times a day (BID) | ORAL | 0 refills | Status: DC | PRN

## 2019-03-30 NOTE — ED Triage Notes (Signed)
Patient reports this AM leaned over to pick up a shirt off of the floor and experienced severe pain. He has a history of back issues with L3-L5 and sees Dr. Dianah Field for this issue. Took 1 tramadol.

## 2019-03-30 NOTE — Discharge Instructions (Addendum)
Start prednisone tomorrow as follows: Take Prednisone 20 mg,  in mornings with breakfast as follows:  Take 3 pills for 3 days, Take 2 pills for 3 days, and Take 1 pill for 3 days.  Complete all medication.  Three days of tramadol provided for moderate to severe pain. Please follow-up with Dr. Darene Lamer on Monday for additional doses if needed.  Flexeril 10 mg twice daily as needed for pain-medication can cause drowsiness and sedation therefore only take if you are not operating a vehicle or heavy machinery.

## 2019-03-30 NOTE — ED Provider Notes (Signed)
[name]    CSN: [id number] Arrival date & time: 03/30/19  1158      History   Chief Complaint Chief Complaint  Patient presents with  . Back Pain    low back    HPI Harold Edwards is a 64 y.o. male.   HPI  Trevious with a medical history significant for prostate cancer and chronic lumbar radiculopathy back pain presents today for acute onset lower left radiating to the right sided lumbar back pain following bending over today to pick an object up off the floor.  Patient is followed by Dr. Helane Rima for management of chronic lumbar back pain. His last encounter with Dr. Darene Lamer was 03/13/19 in which he discussed the possibility of patient undergoing a surgical intervention however patient declined.  He is currently managing symptoms with gabapentin and reports taking 1 dose of leftover tramadol for pain today.  Tramadol did not improve level of pain.  Feels current pain today is different than previous chronic back pain.  He denies any loss of continence.  When the injury first occurred he was unable to lift his left arm without sustaining severe pain left thoracic and lumbar regions of back. Past Medical History:  Diagnosis Date  . Cancer ([place])   . Chronic cough 09/19/2017  . Influenza   . Pneumonia   . Prostate cancer ([place]) 02/21/2014   4+4=8 s/p TRUS biopsy by Dr. Elnoria Howard on 01/18/14  Last Assessment & Plan:   Patient be managed by urology.  Patient is status post prostatectomy and radiation therapy.  Denies hematuria.  . Sinus tachycardia 09/13/2017  . Stress incontinence 04/10/2014    Patient Active Problem List   Diagnosis Date Noted  . Senile purpura ([place]) 10/31/2018  . Former smoker, stopped smoking in distant past 04/20/2018  . 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 Chronic cough 09/19/2017  . Mixed hyperlipidemia 09/19/2017  . Medication side effects 09/13/2017  . Sinus tachycardia 09/13/2017  . ED (erectile dysfunction)  04/10/2014  . Stress incontinence 04/10/2014  . S/P prostatectomy 03/18/2014  . History of prostate cancer 02/21/2014    Past Surgical History:  Procedure Laterality Date  . ABDOMINAL SURGERY    . PROSTATE SURGERY     Prostatectomy robot. Dr Tommi Rumps and Dr Redmond Pulling  . TONSILLECTOMY         Home Medications    Prior to Admission medications   Medication Sig Start Date End Date Taking? Authorizing Provider  gabapentin (NEURONTIN) 300 MG capsule One tab PO qHS for a week, then BID for a week, then TID. May double weekly to a max of 3,600mg /day 02/12/19   Silverio Decamp, MD  metoprolol succinate (TOPROL-XL) 50 MG 24 hr tablet Take 1 tablet (50 mg total) by mouth daily. Take with or immediately following a meal. 02/14/19   Donella Stade, PA-C    Family History Family History  Problem Relation Age of Onset  . Breast cancer Other   . Diabetes Other     Social History Social History   Tobacco Use  . Smoking status: Former Smoker    Packs/day: 1.00    Years: 13.00    Pack years: 13.00    Types: Cigarettes    Start date: 02/08/1974    Quit date: 10/02/1988    Years since quitting: 30.5  . Smokeless tobacco: Never Used  Substance Use Topics  . Alcohol use: Yes  . Drug use: Never     Allergies  Amoxicillin   Review of Systems Review of Systems Pertinent negatives listed in HPI  Physical Exam Triage Vital Signs ED Triage Vitals  Enc Vitals Group     BP 03/30/19 1221 125/83     Pulse Rate 03/30/19 1221 (!) 115     Resp 03/30/19 1221 18     Temp 03/30/19 1221 97.8 F (36.6 C)     Temp Source 03/30/19 1221 Oral     SpO2 03/30/19 1221 96 %     Weight 03/30/19 1223 205 lb (93 kg)     Height 03/30/19 1223 6\' 1"  (1.854 m)     Head Circumference --      Peak Flow --      Pain Score 03/30/19 1222 10     Pain Loc --      Pain Edu? --      Excl. in [place]? --    No data found.  Updated Vital Signs BP 125/83 (BP Location: Left Arm)   Pulse (!) 115   Temp  97.8 F (36.6 C) (Oral)   Resp 18   Ht 6\' 1"  (1.854 m)   Wt 205 lb (93 kg)   SpO2 96%   BMI 27.05 kg/m   Visual Acuity Right Eye Distance:   Left Eye Distance:   Bilateral Distance:    Right Eye Near:   Left Eye Near:    Bilateral Near:     Physical Exam   UC Treatments / Results  Labs (all labs ordered are listed, but only abnormal results are displayed) Labs Reviewed - No data to display  EKG   Radiology No results found.  Procedures Procedures (including critical care time)  Medications Ordered in UC Medications - No data to display  Initial Impression / Assessment and Plan / UC Course  I have reviewed the triage vital signs and the nursing notes.  Pertinent labs & imaging results that were available during my care of the patient were reviewed by me and considered in my medical decision making (see chart for details).     1. Lumbosacral radiculopathy 2. Sciatica of left side Solu-Medrol 125 IM and Toradol 60 IM given today in clinic. Obtained thoracic along with a lumbar spine image to ensure no acute on chronic process was occurring.  Imaging revealed ongoing chronic changes involving the thoracic and lumbar spine.  Patient advised to start prednisone taper tomorrow.  Prescribed a short course of tramadol as needed for pain.  Patient advised to follow-up with Dr. Darene Lamer if additional pain management is required.  Patient did achieve some level of relief with Solu-Medrol and Toradol.  He was advised to call Dr. Mcneil Sober office to schedule his follow-up.  Final Clinical Impressions(s) / UC Diagnoses   Final diagnoses:  Lumbosacral radiculopathy  Sciatica of left side     Discharge Instructions     Start prednisone tomorrow as follows: Take Prednisone 20 mg,  in mornings with breakfast as follows:  Take 3 pills for 3 days, Take 2 pills for 3 days, and Take 1 pill for 3 days.  Complete all medication.  Three days of tramadol provided for moderate to severe pain.  Please follow-up with Dr. Darene Lamer on Monday for additional doses if needed.  Flexeril 10 mg twice daily as needed for pain-medication can cause drowsiness and sedation therefore only take if you are not operating a vehicle or heavy machinery.    ED Prescriptions    Medication Sig Dispense Auth. Provider   predniSONE (DELTASONE)  20 MG tablet Take 1 tablet (20 mg total) by mouth daily with breakfast. Prednisone 20 mg, in mornings with breakfast as follows: Take 3 pills for 3 days, take 2 pills for 3 days, and take 1 pill for 3 days. 18 tablet Scot Jun, FNP   traMADol (ULTRAM) 50 MG tablet Take 1 tablet (50 mg total) by mouth every 6 (six) hours as needed. 15 tablet Scot Jun, FNP   cyclobenzaprine (FLEXERIL) 10 MG tablet Take 1 tablet (10 mg total) by mouth 2 (two) times daily as needed for muscle spasms. 20 tablet Scot Jun, FNP     PDMP not reviewed this encounter.   Scot Jun, FNP 03/30/19 1349

## 2019-04-12 ENCOUNTER — Ambulatory Visit: Payer: Managed Care, Other (non HMO) | Admitting: Sports Medicine

## 2019-04-29 ENCOUNTER — Other Ambulatory Visit: Payer: Self-pay | Admitting: Physician Assistant

## 2019-04-29 DIAGNOSIS — M5416 Radiculopathy, lumbar region: Secondary | ICD-10-CM

## 2019-04-30 MED ORDER — GABAPENTIN 300 MG PO CAPS: ORAL_CAPSULE | ORAL | 1 refills | Status: DC

## 2019-05-22 ENCOUNTER — Ambulatory Visit: Payer: Managed Care, Other (non HMO) | Admitting: Sports Medicine

## 2019-05-25 ENCOUNTER — Ambulatory Visit: Payer: Managed Care, Other (non HMO) | Admitting: Sports Medicine

## 2019-08-22 ENCOUNTER — Ambulatory Visit: Payer: Managed Care, Other (non HMO) | Admitting: Sports Medicine

## 2019-08-24 ENCOUNTER — Emergency Department (INDEPENDENT_AMBULATORY_CARE_PROVIDER_SITE_OTHER)
Admission: EM | Admit: 2019-08-24 | Discharge: 2019-08-24 | Disposition: A | Discharge status: Home / Self Care | Payer: Managed Care, Other (non HMO) | Source: Home / Self Care

## 2019-08-24 ENCOUNTER — Emergency Department (INDEPENDENT_AMBULATORY_CARE_PROVIDER_SITE_OTHER): Payer: Managed Care, Other (non HMO)

## 2019-08-24 ENCOUNTER — Other Ambulatory Visit: Payer: Self-pay

## 2019-08-24 DIAGNOSIS — M546 Pain in thoracic spine: Secondary | ICD-10-CM

## 2019-08-24 DIAGNOSIS — R05 Cough: Secondary | ICD-10-CM

## 2019-08-24 DIAGNOSIS — R071 Chest pain on breathing: Secondary | ICD-10-CM | POA: Diagnosis not present

## 2019-08-24 DIAGNOSIS — R0781 Pleurodynia: Secondary | ICD-10-CM | POA: Diagnosis not present

## 2019-08-24 DIAGNOSIS — R053 Chronic cough: Secondary | ICD-10-CM

## 2019-08-24 DIAGNOSIS — M940 Chondrocostal junction syndrome [Tietze]: Secondary | ICD-10-CM

## 2019-08-24 MED ORDER — PREDNISONE 20 MG PO TABS: ORAL_TABLET | ORAL | 0 refills | Status: DC

## 2019-08-24 MED ORDER — TRAMADOL HCL 50 MG PO TABS: 50.0000 mg | ORAL_TABLET | Freq: Four times a day (QID) | ORAL | 0 refills | Status: DC | PRN

## 2019-08-24 MED ORDER — KETOROLAC TROMETHAMINE 60 MG/2ML IM SOLN
60.0000 mg | Freq: Once | INTRAMUSCULAR | Status: AC
  Administered 2019-08-24: 60 mg via INTRAMUSCULAR

## 2019-08-24 MED ORDER — PSEUDOEPH-BROMPHEN-DM 30-2-10 MG/5ML PO SYRP: 5.0000 mL | ORAL_SOLUTION | Freq: Two times a day (BID) | ORAL | 0 refills | Status: DC | PRN

## 2019-08-24 MED ORDER — CYCLOBENZAPRINE HCL 10 MG PO TABS: 10.0000 mg | ORAL_TABLET | Freq: Two times a day (BID) | ORAL | 0 refills | Status: DC | PRN

## 2019-08-24 NOTE — ED Provider Notes (Addendum)
[name]    CSN: [id number] Arrival date & time: 08/24/19  1062      History   Chief Complaint Chief Complaint  Patient presents with  . Cough    Chronic  . Back Pain    HPI Harold Edwards is a 64 y.o. male.   HPI  Patient with a history of chronic lumbar pain and chronic cough presents today with acute onset upper right chest and rib pain which radiates to the mid thoracic spine.  Pain initially started 1 week ago.  Yesterday he was in a vehicle in which he bounced up and down for an extended period of time and upon waking this morning he was having difficulty breathing without experiencing significant right chest wall and rib pain.  Pain is reproducible with palpation.  He denies any specific injury that could have caused the pain.  He has taken a couple doses of leftover tramadol with mild relief of symptoms although symptoms have not resolved since the onset of pain.  Patient also has a chronic cough.  He is a former smoker although very distant history of smoking.  He reports over the course of the last few months the cough has become significantly worse.  He has found with episodes of coughing especially in the morning time and evening hours.  Cough is sometimes productive.  He is unable to manage cough with OTC anticough suppressants.  He has a history of pneumonia although distant history.  He also had prostate cancer which she was treated for the prostatectomy in 2015.  Past Medical History:  Diagnosis Date  . Cancer ([place])   . Chronic cough 09/19/2017  . Influenza   . Pneumonia   . Prostate cancer ([place]) 02/21/2014   4+4=8 s/p TRUS biopsy by Dr. Elnoria Howard on 01/18/14  Last Assessment & Plan:   Patient be managed by urology.  Patient is status post prostatectomy and radiation therapy.  Denies hematuria.  . Sinus tachycardia 09/13/2017  . Stress incontinence 04/10/2014    Patient Active Problem List   Diagnosis Date Noted  . Senile purpura ([place]) 10/31/2018  . Former  smoker, stopped smoking in distant past 04/20/2018  . 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 Chronic cough 09/19/2017  . Mixed hyperlipidemia 09/19/2017  . Medication side effects 09/13/2017  . Sinus tachycardia 09/13/2017  . ED (erectile dysfunction) 04/10/2014  . Stress incontinence 04/10/2014  . S/P prostatectomy 03/18/2014  . History of prostate cancer 02/21/2014    Past Surgical History:  Procedure Laterality Date  . ABDOMINAL SURGERY    . PROSTATE SURGERY     Prostatectomy robot. Dr Tommi Rumps and Dr Redmond Pulling  . TONSILLECTOMY         Home Medications    Prior to Admission medications   Medication Sig Start Date End Date Taking? Authorizing Provider  gabapentin (NEURONTIN) 300 MG capsule One tab PO qHS for a week, then BID for a week, then TID. May double weekly to a max of 3,600mg /day 04/30/19  Yes Breeback, Jade L, PA-C  metoprolol succinate (TOPROL-XL) 50 MG 24 hr tablet Take 1 tablet (50 mg total) by mouth daily. Take with or immediately following a meal. 02/14/19  Yes Breeback, Jade L, PA-C  brompheniramine-pseudoephedrine-DM 30-2-10 MG/5ML syrup Take 5 mLs by mouth 2 (two) times daily as needed. 08/24/19   Scot Jun, FNP  cyclobenzaprine (FLEXERIL) 10 MG tablet Take 1 tablet (10 mg total) by mouth 2 (two) times daily as  needed for muscle spasms. 08/24/19   Scot Jun, FNP  predniSONE (DELTASONE) 20 MG tablet Take 3 PO QAM x3days, 2 PO QAM x3days, 1 PO QAM x3days 08/24/19   Scot Jun, FNP  traMADol (ULTRAM) 50 MG tablet Take 1 tablet (50 mg total) by mouth every 6 (six) hours as needed for up to 5 days for moderate pain or severe pain. 08/24/19 08/29/19  Scot Jun, FNP    Family History Family History  Problem Relation Age of Onset  . Breast cancer Other   . Diabetes Other   . Cancer Mother   . Diabetes Mother   . Cancer Father   . Diabetes Father     Social History Social History   Tobacco  Use  . Smoking status: Former Smoker    Packs/day: 1.00    Years: 13.00    Pack years: 13.00    Types: Cigarettes    Start date: 02/08/1974    Quit date: 10/02/1988    Years since quitting: 30.9  . Smokeless tobacco: Never Used  Vaping Use  . Vaping Use: Never used  Substance Use Topics  . Alcohol use: Yes    Alcohol/week: 3.0 standard drinks    Types: 3 Cans of beer per week  . Drug use: Never     Allergies   Amoxicillin   Review of Systems Review of Systems   Physical Exam Triage Vital Signs ED Triage Vitals  Enc Vitals Group     BP 08/24/19 0848 133/89     Pulse Rate 08/24/19 0848 91     Resp 08/24/19 0848 16     Temp 08/24/19 0848 98.1 F (36.7 C)     Temp Source 08/24/19 0848 Oral     SpO2 08/24/19 0848 99 %     Weight --      Height --      Head Circumference --      Peak Flow --      Pain Score 08/24/19 0845 9     Pain Loc --      Pain Edu? --      Excl. in [place]? --    No data found.  Updated Vital Signs BP 133/89 (BP Location: Left Arm)   Pulse 91   Temp 98.1 F (36.7 C) (Oral)   Resp 16   SpO2 99%   Visual Acuity Right Eye Distance:   Left Eye Distance:   Bilateral Distance:    Right Eye Near:   Left Eye Near:    Bilateral Near:     Physical Exam Vitals reviewed.  Constitutional:      Appearance: Normal appearance. He is not ill-appearing.  Cardiovascular:     Rate and Rhythm: Normal rate and regular rhythm.  Pulmonary:     Effort: Pulmonary effort is normal.     Breath sounds: Normal breath sounds.  Musculoskeletal:     Thoracic back: Decreased range of motion.     Lumbar back: Decreased range of motion.  Skin:    General: Skin is warm.     Capillary Refill: Capillary refill takes less than 2 seconds.  Neurological:     Mental Status: He is alert.  Psychiatric:        Attention and Perception: Attention normal.        Mood and Affect: Mood normal.     UC Treatments / Results  Labs (all labs ordered are listed, but only  abnormal results are displayed) Labs Reviewed -  No data to display  EKG   Radiology No results found.  Procedures Procedures (including critical care time)  Medications Ordered in UC Medications  ketorolac (TORADOL) injection 60 mg (60 mg Intramuscular Given 08/24/19 0915)    Initial Impression / Assessment and Plan / UC Course  I have reviewed the triage vital signs and the nursing notes.  Pertinent labs & imaging results that were available during my care of the patient were reviewed by me and considered in my medical decision making (see chart for details).     Chronic recurring cough, chest x-ray negative. Recommended pulmonologist evaluation for further work up of on-going cough. Trial a course of prednisone for both chronic cough and costochondritis. Bromfed for cough. Toradol 60 mg IM administered in clinic. Tramadol and cyclobenzaprine for back pain. Recommended follow-up with spine specialist, PCP, or Dr. Arnette Felts medicine. An After Visit Summary was printed and given to the patient/family. Precautions discussed. Red flags discussed. Questions invited and answered. They voiced understanding and agreement.    Final Clinical Impressions(s) / UC Diagnoses   Final diagnoses:  Chronic cough  Costochondritis  Thoracic spine pain     Discharge Instructions     Your chest x-ray was normal and negative for any rib injury. I suspect you have costochondritis which is inflammation of the chest wall and some inflammation relating to your degenerative back disease.  We will treat you with a course of prednisone.  I have also sent over a short course of tramadol as needed for pain.  And Flexeril for any type of back spasms.  If any of your symptoms worsen or do not improve with prescribed therapy recommend following up with your primary care provider for referral to spine specialist or physical therapy. I have sent over medication to help with chronic cough. Take as  needed.    ED Prescriptions    Medication Sig Dispense Auth. Provider   traMADol (ULTRAM) 50 MG tablet Take 1 tablet (50 mg total) by mouth every 6 (six) hours as needed for up to 5 days for moderate pain or severe pain. 15 tablet Scot Jun, FNP   predniSONE (DELTASONE) 20 MG tablet Take 3 PO QAM x3days, 2 PO QAM x3days, 1 PO QAM x3days 18 tablet Scot Jun, FNP   cyclobenzaprine (FLEXERIL) 10 MG tablet Take 1 tablet (10 mg total) by mouth 2 (two) times daily as needed for muscle spasms. 20 tablet Scot Jun, FNP   brompheniramine-pseudoephedrine-DM 30-2-10 MG/5ML syrup Take 5 mLs by mouth 2 (two) times daily as needed. 180 mL Scot Jun, FNP     I have reviewed the PDMP during this encounter.   Scot Jun, FNP 08/28/19 [place], [place], FNP 08/28/19 [phone]

## 2019-08-24 NOTE — Discharge Instructions (Addendum)
Your chest x-ray was normal and negative for any rib injury. I suspect you have costochondritis which is inflammation of the chest wall and some inflammation relating to your degenerative back disease.  We will treat you with a course of prednisone.  I have also sent over a short course of tramadol as needed for pain.  And Flexeril for any type of back spasms.  If any of your symptoms worsen or do not improve with prescribed therapy recommend following up with your primary care provider for referral to spine specialist or physical therapy. I have sent over medication to help with chronic cough. Take as needed.

## 2019-08-24 NOTE — ED Triage Notes (Signed)
Patient presents to Urgent Care with complaints of back, chest, and right side pain since a week ago. Patient reports he has a chronic cough, but the coughing has escalated to pain on the right side as well. Pt has not taken anything for pain.

## 2019-12-17 ENCOUNTER — Other Ambulatory Visit: Payer: Self-pay | Admitting: Family Medicine

## 2019-12-18 ENCOUNTER — Other Ambulatory Visit: Payer: Self-pay | Admitting: Family Medicine

## 2020-01-29 ENCOUNTER — Encounter: Payer: Self-pay | Admitting: Emergency Medicine

## 2020-01-29 ENCOUNTER — Other Ambulatory Visit: Payer: Self-pay

## 2020-01-29 ENCOUNTER — Emergency Department (INDEPENDENT_AMBULATORY_CARE_PROVIDER_SITE_OTHER)
Admission: EM | Admit: 2020-01-29 | Discharge: 2020-01-29 | Disposition: A | Discharge status: Home / Self Care | Payer: Managed Care, Other (non HMO) | Source: Home / Self Care | Attending: Family Medicine | Admitting: Family Medicine

## 2020-01-29 DIAGNOSIS — R197 Diarrhea, unspecified: Secondary | ICD-10-CM | POA: Diagnosis not present

## 2020-01-29 MED ORDER — ONDANSETRON 4 MG PO TBDP
4.0000 mg | ORAL_TABLET | ORAL | Status: AC
  Administered 2020-01-29: 4 mg via ORAL

## 2020-01-29 MED ORDER — ONDANSETRON 4 MG PO TBDP: 4.0000 mg | ORAL_TABLET | Freq: Three times a day (TID) | ORAL | 0 refills | Status: DC | PRN

## 2020-01-29 NOTE — ED Provider Notes (Signed)
[name]    CSN: [id number] Arrival date & time: 01/29/20  0809      History   Chief Complaint Chief Complaint  Patient presents with  . Diarrhea    HPI Harold Edwards is a 64 y.o. male.   Patient presents with diarrhea and some upper respiratory symptoms like sore throat and congestion.  Was exposed to granddaughter who has had also had diarrhea.  Has not had any blood in the diarrhea.  Has had no fever.  He did take some Imodium which seemed to slow things down. No recent antibiotic HPI  Past Medical History:  Diagnosis Date  . Cancer ([place])   . Chronic cough 09/19/2017  . Influenza   . Pneumonia   . Prostate cancer ([place]) 02/21/2014   4+4=8 s/p TRUS biopsy by Dr. Elnoria Howard on 01/18/14  Last Assessment & Plan:   Patient be managed by urology.  Patient is status post prostatectomy and radiation therapy.  Denies hematuria.  . Sinus tachycardia 09/13/2017  . Stress incontinence 04/10/2014    Patient Active Problem List   Diagnosis Date Noted  . Senile purpura ([place]) 10/31/2018  . Former smoker, stopped smoking in distant past 04/20/2018  . 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 Chronic cough 09/19/2017  . Mixed hyperlipidemia 09/19/2017  . Medication side effects 09/13/2017  . Sinus tachycardia 09/13/2017  . ED (erectile dysfunction) 04/10/2014  . Stress incontinence 04/10/2014  . S/P prostatectomy 03/18/2014  . History of prostate cancer 02/21/2014    Past Surgical History:  Procedure Laterality Date  . ABDOMINAL SURGERY    . PROSTATE SURGERY     Prostatectomy robot. Dr Tommi Rumps and Dr Redmond Pulling  . TONSILLECTOMY         Home Medications    Prior to Admission medications   Medication Sig Start Date End Date Taking? Authorizing Provider  brompheniramine-pseudoephedrine-DM 30-2-10 MG/5ML syrup Take 5 mLs by mouth 2 (two) times daily as needed. Patient not taking: Reported on 01/29/2020 08/24/19   Scot Jun,  FNP  cyclobenzaprine (FLEXERIL) 10 MG tablet Take 1 tablet (10 mg total) by mouth 2 (two) times daily as needed for muscle spasms. Patient not taking: Reported on 01/29/2020 08/24/19   Scot Jun, FNP  gabapentin (NEURONTIN) 300 MG capsule One tab PO qHS for a week, then BID for a week, then TID. May double weekly to a max of 3,600mg /day 04/30/19   [name] L, PA-C  metoprolol succinate (TOPROL-XL) 50 MG 24 hr tablet Take 1 tablet (50 mg total) by mouth daily. Take with or immediately following a meal. 02/14/19   Breeback, Jade L, PA-C  predniSONE (DELTASONE) 20 MG tablet Take 3 PO QAM x3days, 2 PO QAM x3days, 1 PO QAM x3days Patient not taking: Reported on 01/29/2020 08/24/19   Scot Jun, FNP    Family History Family History  Problem Relation Age of Onset  . Breast cancer Other   . Diabetes Other   . Cancer Mother   . Diabetes Mother   . Cancer Father   . Diabetes Father     Social History Social History   Tobacco Use  . Smoking status: Former Smoker    Packs/day: 1.00    Years: 13.00    Pack years: 13.00    Types: Cigarettes    Start date: 02/08/1974    Quit date: 10/02/1988    Years since quitting: 31.3  . Smokeless tobacco: Never Used  Vaping Use  .  Vaping Use: Never used  Substance Use Topics  . Alcohol use: Yes    Alcohol/week: 3.0 standard drinks    Types: 3 Cans of beer per week  . Drug use: Never     Allergies   Amoxicillin   Review of Systems Review of Systems  Gastrointestinal: Positive for diarrhea and nausea.  All other systems reviewed and are negative.    Physical Exam Triage Vital Signs ED Triage Vitals  Enc Vitals Group     BP 01/29/20 0824 123/85     Pulse Rate 01/29/20 0824 (!) 116     Resp 01/29/20 0824 16     Temp 01/29/20 0824 (!) 97.5 F (36.4 C)     Temp Source 01/29/20 0824 Oral     SpO2 01/29/20 0824 98 %     Weight 01/29/20 0828 210 lb (95.3 kg)     Height 01/29/20 0828 6\' 1"  (1.854 m)     Head Circumference  --      Peak Flow --      Pain Score 01/29/20 0825 4     Pain Loc --      Pain Edu? --      Excl. in [place]? --    No data found.  Updated Vital Signs BP 123/85 (BP Location: Right Arm)   Pulse (!) 116   Temp (!) 97.5 F (36.4 C) (Oral)   Resp 16   Ht 6\' 1"  (1.854 m)   Wt 95.3 kg   SpO2 98%   BMI 27.71 kg/m   Visual Acuity Right Eye Distance:   Left Eye Distance:   Bilateral Distance:    Right Eye Near:   Left Eye Near:    Bilateral Near:     Physical Exam Vitals and nursing note reviewed.  Constitutional:      Appearance: Normal appearance.  HENT:     Head: Normocephalic.     Right Ear: Tympanic membrane normal.     Left Ear: Tympanic membrane normal.     Mouth/Throat:     Mouth: Mucous membranes are moist.     Pharynx: Posterior oropharyngeal erythema present.  Cardiovascular:     Rate and Rhythm: Normal rate and regular rhythm.  Pulmonary:     Effort: Pulmonary effort is normal.     Breath sounds: Normal breath sounds.  Abdominal:     General: Abdomen is flat.     Palpations: Abdomen is soft.     Tenderness: There is no abdominal tenderness.  Neurological:     General: No focal deficit present.     Mental Status: He is alert and oriented to person, place, and time.      UC Treatments / Results  Labs (all labs ordered are listed, but only abnormal results are displayed) Labs Reviewed - No data to display  EKG   Radiology No results found.  Procedures Procedures (including critical care time)  Medications Ordered in UC Medications  ondansetron (ZOFRAN-ODT) disintegrating tablet 4 mg (4 mg Oral Given 01/29/20 0830)    Initial Impression / Assessment and Plan / UC Course  I have reviewed the triage vital signs and the nursing notes.  Pertinent labs & imaging results that were available during my care of the patient were reviewed by me and considered in my medical decision making (see chart for details).     Diarrhea, probably viral  etiology Final Clinical Impressions(s) / UC Diagnoses   Final diagnoses:  None   Discharge Instructions   None  ED Prescriptions    None     PDMP not reviewed this encounter.   Wardell Honour, MD 01/29/20 [phone]

## 2020-01-29 NOTE — ED Triage Notes (Addendum)
Pt's granddaughter has a stomach bug- pt thinks he got the same from her  Pt c/o diarrhea starting yesterday - went 20 times yesterday  Pt tol liquids - no food No diarrhea today - c/o nausea Also c/o of generalized body aches  No fever  Pfizer vaccine  In January - will not get a "booster because all those people are in the hospital" No flu vaccine

## 2020-03-03 ENCOUNTER — Encounter: Payer: Self-pay | Admitting: Emergency Medicine

## 2020-03-03 ENCOUNTER — Emergency Department (INDEPENDENT_AMBULATORY_CARE_PROVIDER_SITE_OTHER)
Admission: EM | Admit: 2020-03-03 | Discharge: 2020-03-03 | Disposition: A | Discharge status: Home / Self Care | Payer: 59 | Source: Home / Self Care | Attending: Family Medicine | Admitting: Family Medicine

## 2020-03-03 ENCOUNTER — Other Ambulatory Visit: Payer: Self-pay

## 2020-03-03 DIAGNOSIS — J209 Acute bronchitis, unspecified: Secondary | ICD-10-CM | POA: Diagnosis not present

## 2020-03-03 HISTORY — DX: Other chronic pain: G89.29

## 2020-03-03 MED ORDER — AZITHROMYCIN 250 MG PO TABS: ORAL_TABLET | ORAL | 0 refills | Status: DC

## 2020-03-03 NOTE — ED Provider Notes (Signed)
[name]    CSN: [id number] Arrival date & time: 03/03/20  0827      History   Chief Complaint Chief Complaint  Patient presents with  . Cough    HPI Harold Edwards is a 65 y.o. male.   Patient complains of onset of fatigue and sinus congestion about 10 days ago followed by fullness in the right ear.  He developed a non-productive cough four days ago.  He denies fever, pleuritic pain, and shortness of breath.  He had the [place] vaccine January, 2021, but not a booster.  He refuses COVID19/flu tests. Patient has had 3 episodes of pneumonia in the past (last episode 2005)   The history is provided by the patient.    Past Medical History:  Diagnosis Date  . Back pain, chronic   . Cancer ([place])   . Chronic cough 09/19/2017  . Influenza   . Pneumonia   . Prostate cancer ([place]) 02/21/2014   4+4=8 s/p TRUS biopsy by Dr. Elnoria Howard on 01/18/14  Last Assessment & Plan:   Patient be managed by urology.  Patient is status post prostatectomy and radiation therapy.  Denies hematuria.  . Sinus tachycardia 09/13/2017  . Stress incontinence 04/10/2014    Patient Active Problem List   Diagnosis Date Noted  . Senile purpura ([place]) 10/31/2018  . Former smoker, stopped smoking in distant past 04/20/2018  . 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 Chronic cough 09/19/2017  . Mixed hyperlipidemia 09/19/2017  . Medication side effects 09/13/2017  . Sinus tachycardia 09/13/2017  . ED (erectile dysfunction) 04/10/2014  . Stress incontinence 04/10/2014  . S/P prostatectomy 03/18/2014  . History of prostate cancer 02/21/2014    Past Surgical History:  Procedure Laterality Date  . ABDOMINAL SURGERY    . PROSTATE SURGERY     Prostatectomy robot. Dr Tommi Rumps and Dr Redmond Pulling  . TONSILLECTOMY         Home Medications    Prior to Admission medications   Medication Sig Start Date End Date Taking? Authorizing Provider  azithromycin (ZITHROMAX  Z-PAK) 250 MG tablet Take 2 tabs today; then begin one tab once daily for 4 more days. 03/03/20  Yes Kandra Nicolas, MD  brompheniramine-pseudoephedrine-DM 30-2-10 MG/5ML syrup Take 5 mLs by mouth 2 (two) times daily as needed. Patient not taking: Reported on 01/29/2020 08/24/19   Scot Jun, FNP  cyclobenzaprine (FLEXERIL) 10 MG tablet Take 1 tablet (10 mg total) by mouth 2 (two) times daily as needed for muscle spasms. Patient not taking: Reported on 01/29/2020 08/24/19   Scot Jun, FNP  gabapentin (NEURONTIN) 300 MG capsule One tab PO qHS for a week, then BID for a week, then TID. May double weekly to a max of 3,600mg /day 04/30/19   [name] L, PA-C  metoprolol succinate (TOPROL-XL) 50 MG 24 hr tablet Take 1 tablet (50 mg total) by mouth daily. Take with or immediately following a meal. 02/14/19   Breeback, Jade L, PA-C  ondansetron (ZOFRAN ODT) 4 MG disintegrating tablet Take 1 tablet (4 mg total) by mouth every 8 (eight) hours as needed for nausea or vomiting. Patient not taking: Reported on 03/03/2020 01/29/20   Wardell Honour, MD  predniSONE (DELTASONE) 20 MG tablet Take 3 PO QAM x3days, 2 PO QAM x3days, 1 PO QAM x3days Patient not taking: Reported on 01/29/2020 08/24/19   Scot Jun, FNP    Family History Family History  Problem Relation Age of Onset  .  Breast cancer Other   . Diabetes Other   . Cancer Mother   . Diabetes Mother   . Cancer Father   . Diabetes Father     Social History Social History   Tobacco Use  . Smoking status: Former Smoker    Packs/day: 1.00    Years: 13.00    Pack years: 13.00    Types: Cigarettes    Start date: 02/08/1974    Quit date: 10/02/1988    Years since quitting: 31.4  . Smokeless tobacco: Never Used  Vaping Use  . Vaping Use: Never used  Substance Use Topics  . Alcohol use: Yes    Alcohol/week: 3.0 standard drinks    Types: 3 Cans of beer per week  . Drug use: Never     Allergies    Amoxicillin   Review of Systems Review of Systems  No sore throat + cough No pleuritic pain No wheezing + nasal congestion + post-nasal drainage No sinus pain/pressure No itchy/red eyes ? earache No hemoptysis No SOB No fever/chills No nausea No vomiting No abdominal pain No diarrhea No urinary symptoms No skin rash + fatigue No myalgias + headache Used OTC meds (Daquil, Nyquil) without relief    Physical Exam Triage Vital Signs ED Triage Vitals  Enc Vitals Group     BP 03/03/20 0900 129/79     Pulse Rate 03/03/20 0900 (!) 104     Resp 03/03/20 0900 18     Temp 03/03/20 0900 98.9 F (37.2 C)     Temp Source 03/03/20 0900 Tympanic     SpO2 03/03/20 0900 96 %     Weight 03/03/20 0901 210 lb 1.6 oz (95.3 kg)     Height 03/03/20 0901 6\' 1"  (1.854 m)     Head Circumference --      Peak Flow --      Pain Score 03/03/20 0901 3     Pain Loc --      Pain Edu? --      Excl. in [place]? --    No data found.  Updated Vital Signs BP 129/79 (BP Location: Right Arm)   Pulse (!) 104   Temp 98.9 F (37.2 C) (Tympanic)   Resp 18   Ht 6\' 1"  (1.854 m)   Wt 95.3 kg   SpO2 96%   BMI 27.72 kg/m   Visual Acuity Right Eye Distance:   Left Eye Distance:   Bilateral Distance:    Right Eye Near:   Left Eye Near:    Bilateral Near:     Physical Exam Nursing notes and Vital Signs reviewed. Appearance:  Patient appears stated age, and in no acute distress Eyes:  Pupils are equal, round, and reactive to light and accomodation.  Extraocular movement is intact.  Conjunctivae are not inflamed  Ears:  Canals normal.  Tympanic membranes normal.  Nose:  Mildly congested turbinates.  No sinus tenderness.  Pharynx:  Normal Neck:  Supple.  Shotty lateral nodes are palpated.  Lungs:  Clear to auscultation.  Breath sounds are equal.  Moving air well. Heart:  Regular rate and rhythm without murmurs, rubs, or gallops.  Abdomen:  Nontender without masses or hepatosplenomegaly.  Bowel  sounds are present.  No CVA or flank tenderness.  Extremities:  No edema.  Skin:  No rash present.   UC Treatments / Results  Labs (all labs ordered are listed, but only abnormal results are displayed) Labs Reviewed - No data to display  EKG  Radiology No results found.  Procedures Procedures (including critical care time)  Medications Ordered in UC Medications - No data to display  Initial Impression / Assessment and Plan / UC Course  I have reviewed the triage vital signs and the nursing notes.  Pertinent labs & imaging results that were available during my care of the patient were reviewed by me and considered in my medical decision making (see chart for details).    Note past history of pneumonia (3 times).  Begin Z-pak. Followup with Family Doctor if not improved in about 7 to 10 days.   Final Clinical Impressions(s) / UC Diagnoses   Final diagnoses:  Acute bronchitis, unspecified organism     Discharge Instructions     Take plain guaifenesin (1200mg  extended release tabs such as Mucinex) twice daily, with plenty of water, for cough and congestion.  May add Pseudoephedrine (30mg , one or two every 4 to 6 hours) for sinus congestion.  Get adequate rest.   May use Afrin nasal spray (or generic oxymetazoline) each morning for about 5 days and then discontinue.  Also recommend using saline nasal spray several times daily and saline nasal irrigation (AYR is a common brand).  Use Flonase nasal spray each morning after using Afrin nasal spray and saline nasal irrigation. Try warm salt water gargles for sore throat.  Stop all antihistamines for now, and other non-prescription cough/cold preparations. May take Ibuprofen 200mg , 4 tabs every 8 hours with food for chest/sternum discomfort. May take Delsym Cough Suppressant ("12 Hour Cough Relief") at bedtime for nighttime cough.     ED Prescriptions    Medication Sig Dispense Auth. Provider   azithromycin (ZITHROMAX Z-PAK)  250 MG tablet Take 2 tabs today; then begin one tab once daily for 4 more days. 6 tablet Kandra Nicolas, MD        Kandra Nicolas, MD 03/05/20 1325

## 2020-03-03 NOTE — ED Triage Notes (Addendum)
Cough x 1 week  OTC dayquil/nyquil  Denies fever  Pfizer vaccine Jan 2021 - will not get a booster  COVID & Flu test refused  Also has chronic back pain & would like a muscle relaxer

## 2020-03-03 NOTE — Discharge Instructions (Addendum)
Take plain guaifenesin (1200mg  extended release tabs such as Mucinex) twice daily, with plenty of water, for cough and congestion.  May add Pseudoephedrine (30mg , one or two every 4 to 6 hours) for sinus congestion.  Get adequate rest.   May use Afrin nasal spray (or generic oxymetazoline) each morning for about 5 days and then discontinue.  Also recommend using saline nasal spray several times daily and saline nasal irrigation (AYR is a common brand).  Use Flonase nasal spray each morning after using Afrin nasal spray and saline nasal irrigation. Try warm salt water gargles for sore throat.  Stop all antihistamines for now, and other non-prescription cough/cold preparations. May take Ibuprofen 200mg , 4 tabs every 8 hours with food for chest/sternum discomfort. May take Delsym Cough Suppressant ("12 Hour Cough Relief") at bedtime for nighttime cough.

## 2020-03-09 ENCOUNTER — Encounter: Payer: Self-pay | Admitting: Physician Assistant

## 2020-03-11 MED ORDER — PREDNISONE 50 MG PO TABS: ORAL_TABLET | ORAL | 0 refills | Status: DC

## 2020-04-28 ENCOUNTER — Emergency Department (HOSPITAL_COMMUNITY): Payer: 59

## 2020-04-28 ENCOUNTER — Observation Stay (HOSPITAL_COMMUNITY)
Admission: EM | Admit: 2020-04-28 | Discharge: 2020-04-29 | Disposition: A | Discharge status: Home / Self Care | Payer: 59 | Attending: Internal Medicine | Admitting: Internal Medicine

## 2020-04-28 ENCOUNTER — Encounter (HOSPITAL_COMMUNITY): Payer: Self-pay

## 2020-04-28 ENCOUNTER — Other Ambulatory Visit: Payer: Self-pay

## 2020-04-28 DIAGNOSIS — R000 Tachycardia, unspecified: Secondary | ICD-10-CM

## 2020-04-28 DIAGNOSIS — K529 Noninfective gastroenteritis and colitis, unspecified: Secondary | ICD-10-CM | POA: Diagnosis not present

## 2020-04-28 DIAGNOSIS — G9341 Metabolic encephalopathy: Secondary | ICD-10-CM | POA: Diagnosis not present

## 2020-04-28 DIAGNOSIS — Z20822 Contact with and (suspected) exposure to covid-19: Secondary | ICD-10-CM | POA: Diagnosis not present

## 2020-04-28 DIAGNOSIS — R404 Transient alteration of awareness: Secondary | ICD-10-CM

## 2020-04-28 DIAGNOSIS — Z8546 Personal history of malignant neoplasm of prostate: Secondary | ICD-10-CM | POA: Diagnosis not present

## 2020-04-28 DIAGNOSIS — Z87891 Personal history of nicotine dependence: Secondary | ICD-10-CM | POA: Insufficient documentation

## 2020-04-28 DIAGNOSIS — I700 Atherosclerosis of aorta: Secondary | ICD-10-CM | POA: Diagnosis not present

## 2020-04-28 DIAGNOSIS — R4182 Altered mental status, unspecified: Secondary | ICD-10-CM | POA: Diagnosis not present

## 2020-04-28 DIAGNOSIS — E876 Hypokalemia: Secondary | ICD-10-CM

## 2020-04-28 LAB — RESP PANEL BY RT-PCR (FLU A&B, COVID) ARPGX2
Influenza A by PCR: NEGATIVE
Influenza B by PCR: NEGATIVE
SARS Coronavirus 2 by RT PCR: NEGATIVE

## 2020-04-28 LAB — CBC
HCT: 48.3 % (ref 39.0–52.0)
Hemoglobin: 15.8 g/dL (ref 13.0–17.0)
MCH: 28.8 pg (ref 26.0–34.0)
MCHC: 32.7 g/dL (ref 30.0–36.0)
MCV: 88.1 fL (ref 80.0–100.0)
Platelets: 217 10*3/uL (ref 150–400)
RBC: 5.48 MIL/uL (ref 4.22–5.81)
RDW: 13.3 % (ref 11.5–15.5)
WBC: 8.5 10*3/uL (ref 4.0–10.5)
nRBC: 0 % (ref 0.0–0.2)

## 2020-04-28 LAB — COMPREHENSIVE METABOLIC PANEL
ALT: 25 U/L (ref 0–44)
AST: 22 U/L (ref 15–41)
Albumin: 4.1 g/dL (ref 3.5–5.0)
Alkaline Phosphatase: 54 U/L (ref 38–126)
Anion gap: 6 (ref 5–15)
BUN: 24 mg/dL — ABNORMAL HIGH (ref 8–23)
CO2: 25 mmol/L (ref 22–32)
Calcium: 8.9 mg/dL (ref 8.9–10.3)
Chloride: 108 mmol/L (ref 98–111)
Creatinine, Ser: 0.96 mg/dL (ref 0.61–1.24)
GFR, Estimated: >60 mL/min (ref >60)
Glucose, Bld: 135 mg/dL — ABNORMAL HIGH (ref 70–99)
Potassium: 3.9 mmol/L (ref 3.5–5.1)
Sodium: 139 mmol/L (ref 135–145)
Total Bilirubin: 1.1 mg/dL (ref 0.3–1.2)
Total Protein: 7.2 g/dL (ref 6.5–8.1)

## 2020-04-28 LAB — I-STAT CHEM 8, ED
BUN: 27 mg/dL — ABNORMAL HIGH (ref 8–23)
Calcium, Ion: 1.06 mmol/L — ABNORMAL LOW (ref 1.15–1.40)
Chloride: 108 mmol/L (ref 98–111)
Creatinine, Ser: 0.9 mg/dL (ref 0.61–1.24)
Glucose, Bld: 130 mg/dL — ABNORMAL HIGH (ref 70–99)
HCT: 47 % (ref 39.0–52.0)
Hemoglobin: 16 g/dL (ref 13.0–17.0)
Potassium: 3.8 mmol/L (ref 3.5–5.1)
Sodium: 141 mmol/L (ref 135–145)
TCO2: 23 mmol/L (ref 22–32)

## 2020-04-28 LAB — LIPID PANEL
Cholesterol: 184 mg/dL (ref 0–200)
HDL: 53 mg/dL (ref >40)
LDL Cholesterol: 119 mg/dL — ABNORMAL HIGH (ref 0–99)
Total CHOL/HDL Ratio: 3.5 RATIO
Triglycerides: 60 mg/dL (ref <150)
VLDL: 12 mg/dL (ref 0–40)

## 2020-04-28 LAB — CBG MONITORING, ED: Glucose-Capillary: 127 mg/dL — ABNORMAL HIGH (ref 70–99)

## 2020-04-28 LAB — DIFFERENTIAL
Abs Immature Granulocytes: 0.04 10*3/uL (ref 0.00–0.07)
Basophils Absolute: 0 10*3/uL (ref 0.0–0.1)
Basophils Relative: 0 %
Eosinophils Absolute: 0.1 10*3/uL (ref 0.0–0.5)
Eosinophils Relative: 1 %
Immature Granulocytes: 1 %
Lymphocytes Relative: 7 %
Lymphs Abs: 0.6 10*3/uL — ABNORMAL LOW (ref 0.7–4.0)
Monocytes Absolute: 0.4 10*3/uL (ref 0.1–1.0)
Monocytes Relative: 5 %
Neutro Abs: 7.3 10*3/uL (ref 1.7–7.7)
Neutrophils Relative %: 86 %

## 2020-04-28 LAB — D-DIMER, QUANTITATIVE: D-Dimer, Quant: 0.28 ug/mL-FEU (ref 0.00–0.50)

## 2020-04-28 LAB — PROTIME-INR
INR: 1 (ref 0.8–1.2)
Prothrombin Time: 12.5 seconds (ref 11.4–15.2)

## 2020-04-28 LAB — APTT: aPTT: 26 seconds (ref 24–36)

## 2020-04-28 LAB — TSH: TSH: 0.711 u[IU]/mL (ref 0.350–4.500)

## 2020-04-28 LAB — HIV ANTIBODY (ROUTINE TESTING W REFLEX): HIV Screen 4th Generation wRfx: NONREACTIVE

## 2020-04-28 LAB — MAGNESIUM: Magnesium: 2.1 mg/dL (ref 1.7–2.4)

## 2020-04-28 LAB — TROPONIN I (HIGH SENSITIVITY): Troponin I (High Sensitivity): 3 ng/L (ref <18)

## 2020-04-28 MED ORDER — ENOXAPARIN SODIUM 40 MG/0.4ML ~~LOC~~ SOLN
40.0000 mg | SUBCUTANEOUS | Status: DC
  Administered 2020-04-28: 40 mg via SUBCUTANEOUS
  Filled 2020-04-28: qty 0.4

## 2020-04-28 MED ORDER — METOPROLOL SUCCINATE ER 50 MG PO TB24
50.0000 mg | ORAL_TABLET | Freq: Every day | ORAL | Status: DC
  Administered 2020-04-28 – 2020-04-29 (×2): 50 mg via ORAL
  Filled 2020-04-28: qty 1
  Filled 2020-04-28: qty 2

## 2020-04-28 MED ORDER — IOHEXOL 350 MG/ML SOLN
75.0000 mL | Freq: Once | INTRAVENOUS | Status: AC | PRN
  Administered 2020-04-28: 75 mL via INTRAVENOUS

## 2020-04-28 MED ORDER — SODIUM CHLORIDE 0.9% FLUSH
3.0000 mL | Freq: Once | INTRAVENOUS | Status: AC
Start: 2020-04-28 — End: 2020-04-28
  Administered 2020-04-28: 3 mL via INTRAVENOUS

## 2020-04-28 MED ORDER — ONDANSETRON HCL 4 MG/2ML IJ SOLN: 4.0000 mg | Freq: Three times a day (TID) | INTRAMUSCULAR | Status: DC | PRN

## 2020-04-28 MED ORDER — ACETAMINOPHEN 650 MG RE SUPP: 650.0000 mg | Freq: Four times a day (QID) | RECTAL | Status: DC | PRN

## 2020-04-28 MED ORDER — ACETAMINOPHEN 325 MG PO TABS
650.0000 mg | ORAL_TABLET | Freq: Four times a day (QID) | ORAL | Status: DC | PRN
  Administered 2020-04-28: 650 mg via ORAL
  Filled 2020-04-28: qty 2

## 2020-04-28 MED ORDER — SODIUM CHLORIDE 0.9% FLUSH
3.0000 mL | Freq: Two times a day (BID) | INTRAVENOUS | Status: DC
  Administered 2020-04-28 – 2020-04-29 (×3): 3 mL via INTRAVENOUS

## 2020-04-28 MED ORDER — PANTOPRAZOLE SODIUM 40 MG IV SOLR
40.0000 mg | INTRAVENOUS | Status: DC
  Administered 2020-04-28: 40 mg via INTRAVENOUS
  Filled 2020-04-28: qty 40

## 2020-04-28 MED ORDER — LACTATED RINGERS IV SOLN: INTRAVENOUS | Status: AC

## 2020-04-28 NOTE — H&P (Signed)
History and Physical        Hospital Admission Note Date: 04/28/2020  Patient name: Harold Edwards Medical record number: [id number] Date of birth: 1955-10-12 Age: 65 y.o. Gender: male  PCP: Donella Stade, PA-C   Chief Complaint    Chief Complaint  Patient presents with  . Loss of Consciousness      HPI:   This is a 65 year old male with past medical history of prostate cancer s/p prostatectomy and XRT, hyperlipidemia, sinus tachycardia (not taking his metoprolol), stress incontinence who presented to the ED via EMS as a code stroke.  Patient was last seen normal about 80 minutes prior to ED arrival.  Patient states that this AM he was not feeling well and had nausea, non bloody vomiting and non bloody diarrhea. 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 Per prior notes, he was at work when he suddenly became diaphoretic and withdrawn and had several episodes of vomiting and described headache, neck pain and chest pain to coworkers. EMS was contacted by his work. The patient does not recall this. Per EMS, patient was hypertensive, diaphoretic with word finding difficulty and slowness to respond. He says he still has some epigastric discomfort but otherwise feels back to normal and without any other complaints. He denies a history of stroke, migraines or seizures. He does not recall if he lost consciousness.    ED Course: Afebrile, tachycardic, hemodynamically stable, on room air. Notable Labs: Sodium 139, K3.9, glucose 135, BUN 24, creatinine 0.96, WBC 8.5, Hb 15.8, platelets 217, troponin 3, D-dimer 0.28, COVID-19 and flu negative. Notable Imaging: CT head without contrast-unremarkable.  CTA head and neck unremarkable.  CT abdomen pelvis without contrast-mildly distended fluid-filled loops of small bowel which may  reflect gastroenteritis.  ED provider consulted neurology  Vitals:   04/28/20 1500 04/28/20 1530  BP: 115/74 119/73  Pulse: (!) 114 (!) 109  Resp: 20 10  Temp:    SpO2: 95% 96%     Review of Systems:  Review of Systems  All other systems reviewed and are negative.   Medical/Social/Family History   Past Medical History: Past Medical History:  Diagnosis Date  . Back pain, chronic   . Cancer ([place])   . Chronic cough 09/19/2017  . Influenza   . Pneumonia   . Prostate cancer ([place]) 02/21/2014   4+4=8 s/p TRUS biopsy by Dr. Elnoria Howard on 01/18/14  Last Assessment & Plan:   Patient be managed by urology.  Patient is status post prostatectomy and radiation therapy.  Denies hematuria.  . Sinus tachycardia 09/13/2017  . Stress incontinence 04/10/2014    Past Surgical History:  Procedure Laterality Date  . ABDOMINAL SURGERY    . PROSTATE SURGERY     Prostatectomy robot. Dr Tommi Rumps and Dr Redmond Pulling  . TONSILLECTOMY      Medications: Prior to Admission medications   Medication Sig Start Date End Date Taking? Authorizing Provider  azithromycin (ZITHROMAX Z-PAK) 250 MG tablet Take 2 tabs today; then begin one tab once daily for 4 more days. 03/03/20   Kandra Nicolas, MD  gabapentin (NEURONTIN) 300 MG capsule One  tab PO qHS for a week, then BID for a week, then TID. May double weekly to a max of 3,600mg /day 04/30/19   [name] L, PA-C  metoprolol succinate (TOPROL-XL) 50 MG 24 hr tablet Take 1 tablet (50 mg total) by mouth daily. Take with or immediately following a meal. 02/14/19   Breeback, Jade L, PA-C  predniSONE (DELTASONE) 50 MG tablet One tab PO daily for 5 days. 03/11/20   Donella Stade, PA-C    Allergies:   Allergies  Allergen Reactions  . Amoxicillin Diarrhea    Stomach cramps    Social History:  reports that he quit smoking about 31 years ago. His smoking use included cigarettes. He started smoking about 46 years ago. He has a 13.00 pack-year smoking history. He has never  used smokeless tobacco. He reports current alcohol use of about 3.0 standard drinks of alcohol per week. He reports that he does not use drugs.  Family History: Family History  Problem Relation Age of Onset  . Breast cancer Other   . Diabetes Other   . Cancer Mother   . Diabetes Mother   . Cancer Father   . Diabetes Father      Objective   Physical Exam: Blood pressure 119/73, pulse (!) 109, temperature 98.1 F (36.7 C), temperature source Oral, resp. rate 10, height 6\' 1"  (1.854 m), weight 98.2 kg, SpO2 96 %.  Physical Exam Vitals and nursing note reviewed.  Constitutional:      Appearance: Normal appearance.  HENT:     Head: Normocephalic and atraumatic.  Eyes:     Conjunctiva/sclera: Conjunctivae normal.  Cardiovascular:     Rate and Rhythm: Regular rhythm. Tachycardia present.  Pulmonary:     Effort: Pulmonary effort is normal.     Breath sounds: Normal breath sounds.  Abdominal:     General: Abdomen is flat.     Palpations: Abdomen is soft.  Musculoskeletal:        General: No swelling or tenderness.  Skin:    Coloration: Skin is not jaundiced or pale.  Neurological:     Mental Status: He is alert. Mental status is at baseline.  Psychiatric:        Mood and Affect: Mood normal.        Behavior: Behavior normal.     LABS on Admission: I have personally reviewed all the labs and imaging below    Basic Metabolic Panel: Recent Labs  Lab 04/28/20 1118 04/28/20 1123  NA 139 141  K 3.9 3.8  CL 108 108  CO2 25  --   GLUCOSE 135* 130*  BUN 24* 27*  CREATININE 0.96 0.90  CALCIUM 8.9  --    Liver Function Tests: Recent Labs  Lab 04/28/20 1118  AST 22  ALT 25  ALKPHOS 54  BILITOT 1.1  PROT 7.2  ALBUMIN 4.1   No results for input(s): LIPASE, AMYLASE in the last 168 hours. No results for input(s): AMMONIA in the last 168 hours. CBC: Recent Labs  Lab 04/28/20 1118 04/28/20 1123  WBC 8.5  --   NEUTROABS 7.3  --   HGB 15.8 16.0  HCT 48.3 47.0   MCV 88.1  --   PLT 217  --    Cardiac Enzymes: No results for input(s): CKTOTAL, CKMB, CKMBINDEX, TROPONINI in the last 168 hours. BNP: Invalid input(s): POCBNP CBG: Recent Labs  Lab 04/28/20 1117  GLUCAP 127*    Radiological Exams on Admission:  [place]  Result Date: 04/28/2020 CLINICAL DATA:  Abdominal pain, acute nonlocalized. History of diarrheal episodes. EXAM: CT ABDOMEN AND PELVIS WITHOUT CONTRAST TECHNIQUE: Multidetector CT imaging of the abdomen and pelvis was performed following the standard protocol without IV contrast. COMPARISON:  CT angiography of the head and neck of the same date. FINDINGS: Lower chest: Basilar atelectasis.  No effusion.  No consolidation. Hepatobiliary: Hepatic steatosis and low-density hepatic lesions that are likely small cysts. No pericholecystic stranding. No gross biliary duct distension. Pancreas: No pancreatic contour abnormality. No sign of peripancreatic inflammation. Spleen: Spleen normal size and contour. Adrenals/Urinary Tract: Adrenal glands are normal. No perinephric stranding. No hydronephrosis. Excreted contrast from the recent CT angiography. Stomach/Bowel: Stomach moderately distended. No perigastric stranding. No bowel obstruction though there are mildly dilated loops of small bowel scattered about the abdomen. Mild mesenteric haziness and scattered small lymph nodes throughout the jejunal mesentery. No pneumatosis. Appendix not visualized. No secondary signs to suggest acute appendicitis Vascular/Lymphatic: Normal caliber abdominal aorta with scattered atheromatous plaque. There is no gastrohepatic or hepatoduodenal ligament lymphadenopathy. No retroperitoneal or mesenteric lymphadenopathy. No pelvic sidewall lymphadenopathy. Reproductive: Post prostatectomy. Other: No ascites.  No free air. Musculoskeletal: Spinal degenerative changes. No acute or destructive bone process. Sclerotic focus in the LEFT sacrum, densely  sclerotic measuring 7 mm. Suggested on previous imaging from 2020, lumbar spine plain films. Appearance most suggestive of small bone island but with patient history is of uncertain significance. IMPRESSION: 1. Mildly distended fluid-filled loops of small bowel in the mid small bowel may reflect sequela of gastroenteritis. No acute findings in the abdomen or pelvis. 2. Mild haziness in jejunal mesentery reported on studies of 2011, images not available. Findings in the absence of splenomegaly or frank adenopathy or likely related to prior mesenteric inflammation. 3. Hepatic steatosis and low-density hepatic lesions that are likely small cysts. 4. Sclerosis in the LEFT hemi sacrum with focal and dense characteristics favored to represent a small bone island suggested on previous imaging as far back as 2020. Comparison with more remote priors may be helpful to determine significance in this patient with history of prostate cancer. 5. Post prostatectomy. 6. Aortic atherosclerosis. Aortic Atherosclerosis (ICD10-I70.0). Electronically Signed   By: Zetta Bills M.D.   On: 04/28/2020 14:31   CT ANGIO HEAD W OR WO CONTRAST  Result Date: 04/28/2020 CLINICAL DATA:  Stroke, TIA EXAM: CT HEAD WITHOUT CONTRAST CT ANGIOGRAPHY OF THE HEAD AND NECK TECHNIQUE: Contiguous axial images were obtained from the base of the skull through the vertex without intravenous contrast. Multidetector CT imaging of the head and neck was performed using the standard protocol during bolus administration of intravenous contrast. Multiplanar CT image reconstructions and MIPs were obtained to evaluate the vascular anatomy. Carotid stenosis measurements (when applicable) are obtained utilizing NASCET criteria, using the distal internal carotid diameter as the denominator. CONTRAST:  75 mL Omnipaque 350 COMPARISON:  None. FINDINGS: CT HEAD Brain: There is no acute intracranial hemorrhage, mass effect, or edema. Gray-white differentiation is  preserved. There is no extra-axial fluid collection. Ventricles and sulci are within normal limits in size and configuration. Vascular: No hyperdense vessel or unexpected calcification. Skull: Calvarium is unremarkable. Sinuses/Orbits: No acute finding. Other: None. ASPECTS ([place] Stroke Program Early CT Score) - Ganglionic level infarction (caudate, lentiform nuclei, internal capsule, insula, M1-M3 cortex): 7 - Supraganglionic infarction (M4-M6 cortex): 3 Total score (0-10 with 10 being normal): 10 Review of the MIP images confirms the above findings CTA NECK Aortic arch: Great vessel origins are patent.  Right carotid system: Patent.  No stenosis at the ICA origin. Left carotid system: Patent.  No stenosis at the ICA origin. Vertebral arteries: Patent. Left vertebral artery is dominant. No stenosis. Skeleton: Mild cervical spine degenerative changes. Other neck: Unremarkable. Upper chest: No apical lung mass. Review of the MIP images confirms the above findings CTA HEAD Anterior circulation: Intracranial internal carotid arteries patent. Anterior and middle cerebral arteries are patent. Posterior circulation: Intracranial vertebral arteries are patent. Right vertebral artery becomes diminutive after PICA origin. Basilar artery is patent. Major cerebellar artery origins are patent. Posterior cerebral arteries are patent. There are bilateral posterior communicating arteries. Venous sinuses: Patent as allowed by contrast bolus timing. Review of the MIP images confirms the above findings IMPRESSION: There is no acute intracranial hemorrhage or evidence of acute infarction. ASPECT score is 10. No large vessel occlusion, hemodynamically significant stenosis, or evidence of dissection. Electronically Signed   By: Macy Mis M.D.   On: 04/28/2020 11:44   CT ANGIO NECK W OR WO CONTRAST  Result Date: 04/28/2020 CLINICAL DATA:  Stroke, TIA EXAM: CT HEAD WITHOUT CONTRAST CT ANGIOGRAPHY OF THE HEAD AND NECK TECHNIQUE:  Contiguous axial images were obtained from the base of the skull through the vertex without intravenous contrast. Multidetector CT imaging of the head and neck was performed using the standard protocol during bolus administration of intravenous contrast. Multiplanar CT image reconstructions and MIPs were obtained to evaluate the vascular anatomy. Carotid stenosis measurements (when applicable) are obtained utilizing NASCET criteria, using the distal internal carotid diameter as the denominator. CONTRAST:  75 mL Omnipaque 350 COMPARISON:  None. FINDINGS: CT HEAD Brain: There is no acute intracranial hemorrhage, mass effect, or edema. Gray-white differentiation is preserved. There is no extra-axial fluid collection. Ventricles and sulci are within normal limits in size and configuration. Vascular: No hyperdense vessel or unexpected calcification. Skull: Calvarium is unremarkable. Sinuses/Orbits: No acute finding. Other: None. ASPECTS ([place] Stroke Program Early CT Score) - Ganglionic level infarction (caudate, lentiform nuclei, internal capsule, insula, M1-M3 cortex): 7 - Supraganglionic infarction (M4-M6 cortex): 3 Total score (0-10 with 10 being normal): 10 Review of the MIP images confirms the above findings CTA NECK Aortic arch: Great vessel origins are patent. Right carotid system: Patent.  No stenosis at the ICA origin. Left carotid system: Patent.  No stenosis at the ICA origin. Vertebral arteries: Patent. Left vertebral artery is dominant. No stenosis. Skeleton: Mild cervical spine degenerative changes. Other neck: Unremarkable. Upper chest: No apical lung mass. Review of the MIP images confirms the above findings CTA HEAD Anterior circulation: Intracranial internal carotid arteries patent. Anterior and middle cerebral arteries are patent. Posterior circulation: Intracranial vertebral arteries are patent. Right vertebral artery becomes diminutive after PICA origin. Basilar artery is patent. Major cerebellar  artery origins are patent. Posterior cerebral arteries are patent. There are bilateral posterior communicating arteries. Venous sinuses: Patent as allowed by contrast bolus timing. Review of the MIP images confirms the above findings IMPRESSION: There is no acute intracranial hemorrhage or evidence of acute infarction. ASPECT score is 10. No large vessel occlusion, hemodynamically significant stenosis, or evidence of dissection. Electronically Signed   By: Macy Mis M.D.   On: 04/28/2020 11:44   CT HEAD CODE STROKE WO CONTRAST  Result Date: 04/28/2020 CLINICAL DATA:  Stroke, TIA EXAM: CT HEAD WITHOUT CONTRAST CT ANGIOGRAPHY OF THE HEAD AND NECK TECHNIQUE: Contiguous axial images were obtained from the base of the skull through the vertex without intravenous contrast. Multidetector CT imaging of  the head and neck was performed using the standard protocol during bolus administration of intravenous contrast. Multiplanar CT image reconstructions and MIPs were obtained to evaluate the vascular anatomy. Carotid stenosis measurements (when applicable) are obtained utilizing NASCET criteria, using the distal internal carotid diameter as the denominator. CONTRAST:  75 mL Omnipaque 350 COMPARISON:  None. FINDINGS: CT HEAD Brain: There is no acute intracranial hemorrhage, mass effect, or edema. Gray-white differentiation is preserved. There is no extra-axial fluid collection. Ventricles and sulci are within normal limits in size and configuration. Vascular: No hyperdense vessel or unexpected calcification. Skull: Calvarium is unremarkable. Sinuses/Orbits: No acute finding. Other: None. ASPECTS ([place] Stroke Program Early CT Score) - Ganglionic level infarction (caudate, lentiform nuclei, internal capsule, insula, M1-M3 cortex): 7 - Supraganglionic infarction (M4-M6 cortex): 3 Total score (0-10 with 10 being normal): 10 Review of the MIP images confirms the above findings CTA NECK Aortic arch: Great vessel origins  are patent. Right carotid system: Patent.  No stenosis at the ICA origin. Left carotid system: Patent.  No stenosis at the ICA origin. Vertebral arteries: Patent. Left vertebral artery is dominant. No stenosis. Skeleton: Mild cervical spine degenerative changes. Other neck: Unremarkable. Upper chest: No apical lung mass. Review of the MIP images confirms the above findings CTA HEAD Anterior circulation: Intracranial internal carotid arteries patent. Anterior and middle cerebral arteries are patent. Posterior circulation: Intracranial vertebral arteries are patent. Right vertebral artery becomes diminutive after PICA origin. Basilar artery is patent. Major cerebellar artery origins are patent. Posterior cerebral arteries are patent. There are bilateral posterior communicating arteries. Venous sinuses: Patent as allowed by contrast bolus timing. Review of the MIP images confirms the above findings IMPRESSION: There is no acute intracranial hemorrhage or evidence of acute infarction. ASPECT score is 10. No large vessel occlusion, hemodynamically significant stenosis, or evidence of dissection. Electronically Signed   By: Macy Mis M.D.   On: 04/28/2020 11:44      EKG: sinus tachycardia   A & P   Principal Problem:   AMS (altered mental status) Active Problems:   History of prostate cancer   Gastroenteritis   Aortic atherosclerosis ([place])   1. Altered mental status of unclear etiology a. Possibly seizure vs. TIA vs. Less likely stroke given his episode of withdrawn nature and word finding difficulty b. CT head and CTA head/neck unremarkable c. Appears back to baseline at this time d. ED provider consulted Neurology, appreciate recommendations e. Telemetry  2. Nausea/vomiting/diarrhea likely gastroenteritis a. CT with findings suggestive of gastroenteritis b. Stool studies c. protonix d. antiemetics  3. Sinus Tachycardia a. Noncompliant at home with his beta blocker b. Restart home  Toprol XL  4. Hyperlipidemia  Aortic atherosclerosis a. Not on meds b. Check lipid panel    DVT prophylaxis: lovenox   Code Status: Full Code  Diet: heart healthy Family Communication: Admission, patients condition and plan of care including tests being ordered have been discussed with the patient who indicates understanding and agrees with the plan and Code Status.  Disposition Plan: The appropriate patient status for this patient is OBSERVATION. Observation status is judged to be reasonable and necessary in order to provide the required intensity of service to ensure the patient's safety. The patient's presenting symptoms, physical exam findings, and initial radiographic and laboratory data in the context of their medical condition is felt to place them at decreased risk for further clinical deterioration. Furthermore, it is anticipated that the patient will be medically stable for discharge from the hospital  within 2 midnights of admission. The following factors support the patient status of observation.   " The patient's presenting symptoms include altered mental status, GI upset. " The physical exam findings include unremarkable. " The initial radiographic and laboratory data are concerning for possible gastroenteritis.    Consultants  . neurology  Procedures  . none  Time Spent on Admission: 56 minutes    Harold Hedge, DO [facility]  04/28/2020, 4:21 PM

## 2020-04-28 NOTE — ED Provider Notes (Signed)
[facility] EMERGENCY DEPARTMENT Provider Note   CSN: [id number] Arrival date & time: 04/28/20  1115     History No chief complaint on file.   Harold Edwards is a 65 y.o. male.  HPI Patient presents via EMS as a code stroke. Last seen normal was 80 minutes prior to ED arrival.  Patient was at work, reportedly, when he was suddenly diaphoretic, withdrawn.  He had several episodes of vomiting, and described pain in his head, neck, chest to coworkers.  History is obtained by EMS providers as the patient is currently only speaking minimally, and only when directly spoken to, with stimuli. Level 5 caveat secondary to acuity of condition. EMS reports patient was hypertensive, diaphoretic in route, seemingly has had pain is resolved, but he has had difficulty with word finding, has been slow to respond.    Past Medical History:  Diagnosis Date  . Back pain, chronic   . Cancer ([place])   . Chronic cough 09/19/2017  . Influenza   . Pneumonia   . Prostate cancer ([place]) 02/21/2014   4+4=8 s/p TRUS biopsy by Dr. Elnoria Howard on 01/18/14  Last Assessment & Plan:   Patient be managed by urology.  Patient is status post prostatectomy and radiation therapy.  Denies hematuria.  . Sinus tachycardia 09/13/2017  . Stress incontinence 04/10/2014    Patient Active Problem List   Diagnosis Date Noted  . Senile purpura ([place]) 10/31/2018  . Former smoker, stopped smoking in distant past 04/20/2018  . 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 Chronic cough 09/19/2017  . Mixed hyperlipidemia 09/19/2017  . Medication side effects 09/13/2017  . Sinus tachycardia 09/13/2017  . ED (erectile dysfunction) 04/10/2014  . Stress incontinence 04/10/2014  . S/P prostatectomy 03/18/2014  . History of prostate cancer 02/21/2014    Past Surgical History:  Procedure Laterality Date  . ABDOMINAL SURGERY    . PROSTATE SURGERY     Prostatectomy robot. Dr Tommi Rumps and Dr  Redmond Pulling  . TONSILLECTOMY         Family History  Problem Relation Age of Onset  . Breast cancer Other   . Diabetes Other   . Cancer Mother   . Diabetes Mother   . Cancer Father   . Diabetes Father     Social History   Tobacco Use  . Smoking status: Former Smoker    Packs/day: 1.00    Years: 13.00    Pack years: 13.00    Types: Cigarettes    Start date: 02/08/1974    Quit date: 10/02/1988    Years since quitting: 31.5  . Smokeless tobacco: Never Used  Vaping Use  . Vaping Use: Never used  Substance Use Topics  . Alcohol use: Yes    Alcohol/week: 3.0 standard drinks    Types: 3 Cans of beer per week  . Drug use: Never    Home Medications Prior to Admission medications   Medication Sig Start Date End Date Taking? Authorizing Provider  azithromycin (ZITHROMAX Z-PAK) 250 MG tablet Take 2 tabs today; then begin one tab once daily for 4 more days. 03/03/20   Kandra Nicolas, MD  gabapentin (NEURONTIN) 300 MG capsule One tab PO qHS for a week, then BID for a week, then TID. May double weekly to a max of 3,600mg /day 04/30/19   [name] L, PA-C  metoprolol succinate (TOPROL-XL) 50 MG 24 hr tablet Take 1 tablet (50 mg total) by mouth daily. Take with or  immediately following a meal. 02/14/19   Breeback, Jade L, PA-C  predniSONE (DELTASONE) 50 MG tablet One tab PO daily for 5 days. 03/11/20   Donella Stade, PA-C    Allergies    Amoxicillin  Review of Systems   Review of Systems  Unable to perform ROS: Acuity of condition    Physical Exam Updated Vital Signs There were no vitals taken for this visit.  Physical Exam Vitals and nursing note reviewed.  Constitutional:      General: He is in acute distress.     Appearance: He is well-developed. He is ill-appearing and diaphoretic.  HENT:     Head: Normocephalic and atraumatic.  Eyes:     Conjunctiva/sclera: Conjunctivae normal.  Cardiovascular:     Rate and Rhythm: Normal rate and regular rhythm.  Pulmonary:      Effort: Pulmonary effort is normal. No respiratory distress.     Breath sounds: No stridor.  Abdominal:     General: There is no distension.  Skin:    General: Skin is warm.  Neurological:     Mental Status: He is alert.     Comments: Patient oriented to place, only roughly to time.  He is moving all extremities spontaneously, has no gross facial asymmetry, but does have delay in word finding, and confusion. Strength grossly symmetric upper and lower  Psychiatric:     Comments: Withdrawn      ED Results / Procedures / Treatments   Labs (all labs ordered are listed, but only abnormal results are displayed) Labs Reviewed  DIFFERENTIAL - Abnormal; Notable for the following components:      Result Value   Lymphs Abs 0.6 (*)    All other components within normal limits  COMPREHENSIVE METABOLIC PANEL - Abnormal; Notable for the following components:   Glucose, Bld 135 (*)    BUN 24 (*)    All other components within normal limits  I-STAT CHEM 8, ED - Abnormal; Notable for the following components:   BUN 27 (*)    Glucose, Bld 130 (*)    Calcium, Ion 1.06 (*)    All other components within normal limits  CBG MONITORING, ED - Abnormal; Notable for the following components:   Glucose-Capillary 127 (*)    All other components within normal limits  RESP PANEL BY RT-PCR (FLU A&B, COVID) ARPGX2  PROTIME-INR  APTT  CBC  D-DIMER, QUANTITATIVE  TROPONIN I (HIGH SENSITIVITY)  TROPONIN I (HIGH SENSITIVITY)    EKG EKG Interpretation  Date/Time:  Monday April 28 2020 11:44:10 EDT Ventricular Rate:  112 PR Interval:    QRS Duration: 90 QT Interval:  354 QTC Calculation: 484 R Axis:   64 Text Interpretation: Sinus tachycardia Borderline prolonged QT interval Artifact Abnormal ECG Confirmed by Carmin Muskrat [phone]) on 04/28/2020 11:53:30 AM   Radiology CT ABDOMEN PELVIS WO CONTRAST  Result Date: 04/28/2020 CLINICAL DATA:  Abdominal pain, acute nonlocalized. History of diarrheal  episodes. EXAM: CT ABDOMEN AND PELVIS WITHOUT CONTRAST TECHNIQUE: Multidetector CT imaging of the abdomen and pelvis was performed following the standard protocol without IV contrast. COMPARISON:  CT angiography of the head and neck of the same date. FINDINGS: Lower chest: Basilar atelectasis.  No effusion.  No consolidation. Hepatobiliary: Hepatic steatosis and low-density hepatic lesions that are likely small cysts. No pericholecystic stranding. No gross biliary duct distension. Pancreas: No pancreatic contour abnormality. No sign of peripancreatic inflammation. Spleen: Spleen normal size and contour. Adrenals/Urinary Tract: Adrenal glands are normal. No perinephric  stranding. No hydronephrosis. Excreted contrast from the recent CT angiography. Stomach/Bowel: Stomach moderately distended. No perigastric stranding. No bowel obstruction though there are mildly dilated loops of small bowel scattered about the abdomen. Mild mesenteric haziness and scattered small lymph nodes throughout the jejunal mesentery. No pneumatosis. Appendix not visualized. No secondary signs to suggest acute appendicitis Vascular/Lymphatic: Normal caliber abdominal aorta with scattered atheromatous plaque. There is no gastrohepatic or hepatoduodenal ligament lymphadenopathy. No retroperitoneal or mesenteric lymphadenopathy. No pelvic sidewall lymphadenopathy. Reproductive: Post prostatectomy. Other: No ascites.  No free air. Musculoskeletal: Spinal degenerative changes. No acute or destructive bone process. Sclerotic focus in the LEFT sacrum, densely sclerotic measuring 7 mm. Suggested on previous imaging from 2020, lumbar spine plain films. Appearance most suggestive of small bone island but with patient history is of uncertain significance. IMPRESSION: 1. Mildly distended fluid-filled loops of small bowel in the mid small bowel may reflect sequela of gastroenteritis. No acute findings in the abdomen or pelvis. 2. Mild haziness in jejunal  mesentery reported on studies of 2011, images not available. Findings in the absence of splenomegaly or frank adenopathy or likely related to prior mesenteric inflammation. 3. Hepatic steatosis and low-density hepatic lesions that are likely small cysts. 4. Sclerosis in the LEFT hemi sacrum with focal and dense characteristics favored to represent a small bone island suggested on previous imaging as far back as 2020. Comparison with more remote priors may be helpful to determine significance in this patient with history of prostate cancer. 5. Post prostatectomy. 6. Aortic atherosclerosis. Aortic Atherosclerosis (ICD10-I70.0). Electronically Signed   By: Zetta Bills M.D.   On: 04/28/2020 14:31   CT ANGIO HEAD W OR WO CONTRAST  Result Date: 04/28/2020 CLINICAL DATA:  Stroke, TIA EXAM: CT HEAD WITHOUT CONTRAST CT ANGIOGRAPHY OF THE HEAD AND NECK TECHNIQUE: Contiguous axial images were obtained from the base of the skull through the vertex without intravenous contrast. Multidetector CT imaging of the head and neck was performed using the standard protocol during bolus administration of intravenous contrast. Multiplanar CT image reconstructions and MIPs were obtained to evaluate the vascular anatomy. Carotid stenosis measurements (when applicable) are obtained utilizing NASCET criteria, using the distal internal carotid diameter as the denominator. CONTRAST:  75 mL Omnipaque 350 COMPARISON:  None. FINDINGS: CT HEAD Brain: There is no acute intracranial hemorrhage, mass effect, or edema. Gray-white differentiation is preserved. There is no extra-axial fluid collection. Ventricles and sulci are within normal limits in size and configuration. Vascular: No hyperdense vessel or unexpected calcification. Skull: Calvarium is unremarkable. Sinuses/Orbits: No acute finding. Other: None. ASPECTS ([place] Stroke Program Early CT Score) - Ganglionic level infarction (caudate, lentiform nuclei, internal capsule, insula, M1-M3  cortex): 7 - Supraganglionic infarction (M4-M6 cortex): 3 Total score (0-10 with 10 being normal): 10 Review of the MIP images confirms the above findings CTA NECK Aortic arch: Great vessel origins are patent. Right carotid system: Patent.  No stenosis at the ICA origin. Left carotid system: Patent.  No stenosis at the ICA origin. Vertebral arteries: Patent. Left vertebral artery is dominant. No stenosis. Skeleton: Mild cervical spine degenerative changes. Other neck: Unremarkable. Upper chest: No apical lung mass. Review of the MIP images confirms the above findings CTA HEAD Anterior circulation: Intracranial internal carotid arteries patent. Anterior and middle cerebral arteries are patent. Posterior circulation: Intracranial vertebral arteries are patent. Right vertebral artery becomes diminutive after PICA origin. Basilar artery is patent. Major cerebellar artery origins are patent. Posterior cerebral arteries are patent. There are bilateral posterior communicating  arteries. Venous sinuses: Patent as allowed by contrast bolus timing. Review of the MIP images confirms the above findings IMPRESSION: There is no acute intracranial hemorrhage or evidence of acute infarction. ASPECT score is 10. No large vessel occlusion, hemodynamically significant stenosis, or evidence of dissection. Electronically Signed   By: Macy Mis M.D.   On: 04/28/2020 11:44   CT ANGIO NECK W OR WO CONTRAST  Result Date: 04/28/2020 CLINICAL DATA:  Stroke, TIA EXAM: CT HEAD WITHOUT CONTRAST CT ANGIOGRAPHY OF THE HEAD AND NECK TECHNIQUE: Contiguous axial images were obtained from the base of the skull through the vertex without intravenous contrast. Multidetector CT imaging of the head and neck was performed using the standard protocol during bolus administration of intravenous contrast. Multiplanar CT image reconstructions and MIPs were obtained to evaluate the vascular anatomy. Carotid stenosis measurements (when applicable) are  obtained utilizing NASCET criteria, using the distal internal carotid diameter as the denominator. CONTRAST:  75 mL Omnipaque 350 COMPARISON:  None. FINDINGS: CT HEAD Brain: There is no acute intracranial hemorrhage, mass effect, or edema. Gray-white differentiation is preserved. There is no extra-axial fluid collection. Ventricles and sulci are within normal limits in size and configuration. Vascular: No hyperdense vessel or unexpected calcification. Skull: Calvarium is unremarkable. Sinuses/Orbits: No acute finding. Other: None. ASPECTS ([place] Stroke Program Early CT Score) - Ganglionic level infarction (caudate, lentiform nuclei, internal capsule, insula, M1-M3 cortex): 7 - Supraganglionic infarction (M4-M6 cortex): 3 Total score (0-10 with 10 being normal): 10 Review of the MIP images confirms the above findings CTA NECK Aortic arch: Great vessel origins are patent. Right carotid system: Patent.  No stenosis at the ICA origin. Left carotid system: Patent.  No stenosis at the ICA origin. Vertebral arteries: Patent. Left vertebral artery is dominant. No stenosis. Skeleton: Mild cervical spine degenerative changes. Other neck: Unremarkable. Upper chest: No apical lung mass. Review of the MIP images confirms the above findings CTA HEAD Anterior circulation: Intracranial internal carotid arteries patent. Anterior and middle cerebral arteries are patent. Posterior circulation: Intracranial vertebral arteries are patent. Right vertebral artery becomes diminutive after PICA origin. Basilar artery is patent. Major cerebellar artery origins are patent. Posterior cerebral arteries are patent. There are bilateral posterior communicating arteries. Venous sinuses: Patent as allowed by contrast bolus timing. Review of the MIP images confirms the above findings IMPRESSION: There is no acute intracranial hemorrhage or evidence of acute infarction. ASPECT score is 10. No large vessel occlusion, hemodynamically significant  stenosis, or evidence of dissection. Electronically Signed   By: Macy Mis M.D.   On: 04/28/2020 11:44   CT HEAD CODE STROKE WO CONTRAST  Result Date: 04/28/2020 CLINICAL DATA:  Stroke, TIA EXAM: CT HEAD WITHOUT CONTRAST CT ANGIOGRAPHY OF THE HEAD AND NECK TECHNIQUE: Contiguous axial images were obtained from the base of the skull through the vertex without intravenous contrast. Multidetector CT imaging of the head and neck was performed using the standard protocol during bolus administration of intravenous contrast. Multiplanar CT image reconstructions and MIPs were obtained to evaluate the vascular anatomy. Carotid stenosis measurements (when applicable) are obtained utilizing NASCET criteria, using the distal internal carotid diameter as the denominator. CONTRAST:  75 mL Omnipaque 350 COMPARISON:  None. FINDINGS: CT HEAD Brain: There is no acute intracranial hemorrhage, mass effect, or edema. Gray-white differentiation is preserved. There is no extra-axial fluid collection. Ventricles and sulci are within normal limits in size and configuration. Vascular: No hyperdense vessel or unexpected calcification. Skull: Calvarium is unremarkable. Sinuses/Orbits: No acute finding.  Other: None. ASPECTS ([place] Stroke Program Early CT Score) - Ganglionic level infarction (caudate, lentiform nuclei, internal capsule, insula, M1-M3 cortex): 7 - Supraganglionic infarction (M4-M6 cortex): 3 Total score (0-10 with 10 being normal): 10 Review of the MIP images confirms the above findings CTA NECK Aortic arch: Great vessel origins are patent. Right carotid system: Patent.  No stenosis at the ICA origin. Left carotid system: Patent.  No stenosis at the ICA origin. Vertebral arteries: Patent. Left vertebral artery is dominant. No stenosis. Skeleton: Mild cervical spine degenerative changes. Other neck: Unremarkable. Upper chest: No apical lung mass. Review of the MIP images confirms the above findings CTA HEAD Anterior  circulation: Intracranial internal carotid arteries patent. Anterior and middle cerebral arteries are patent. Posterior circulation: Intracranial vertebral arteries are patent. Right vertebral artery becomes diminutive after PICA origin. Basilar artery is patent. Major cerebellar artery origins are patent. Posterior cerebral arteries are patent. There are bilateral posterior communicating arteries. Venous sinuses: Patent as allowed by contrast bolus timing. Review of the MIP images confirms the above findings IMPRESSION: There is no acute intracranial hemorrhage or evidence of acute infarction. ASPECT score is 10. No large vessel occlusion, hemodynamically significant stenosis, or evidence of dissection. Electronically Signed   By: Macy Mis M.D.   On: 04/28/2020 11:44    Procedures Procedures   Medications Ordered in ED Medications  sodium chloride flush (NS) 0.9 % injection 3 mL (has no administration in time range)    ED Course  I have reviewed the triage vital signs and the nursing notes.  Pertinent labs & imaging results that were available during my care of the patient were reviewed by me and considered in my medical decision making (see chart for details).    11:52 AM Patient now much more verbal, I reviewed the initial CT findings, discussed with our neurology colleagues, and at bedside we discussed the patient's presentation and work-up thus far. With no substantial CT abnormality, CTA abnormality, and the patient now describing more epigastric and chest pain, additional studies are pending, patient will require admission, but currently has improved, with no early evidence for intracranial hemorrhage, or arterial occlusion in the neck, head.  3:18 PM Patient accompanied by his wife.  We again reviewed all findings, including generally reassuring head CT, CTA.  Patient remains slightly slow in responses, but is otherwise substantially improved since arrival. CT abdomen pelvis  reviewed, no acute findings.  I discussed the patient's case with our internal medicine colleagues. Adult male presents as a code stroke following acute change in cognition.  Given patient's initial presentation concern for head pain, neck pain, word finding difficulty, expedient evaluation with CT, CTA performed.  Patient clinically improved, initial studies were reassuring.  He continues to complain of chest pain, abdominal pain, requiring additional studies, imaging.  D-dimer normal, troponin normal, but the patient remained tachycardic throughout.  Given the unclear circumstances of his event, though his initial studies were reassuring he was admitted for further monitoring, management.  MDM Rules/Calculators/A&P MDM Number of Diagnoses or Management Options Transient alteration of awareness: new, needed workup   Amount and/or Complexity of Data Reviewed Clinical lab tests: reviewed Tests in the radiology section of CPT: reviewed Tests in the medicine section of CPT: reviewed Discussion of test results with the performing providers: yes Decide to obtain previous medical records or to obtain history from someone other than the patient: yes Obtain history from someone other than the patient: yes Review and summarize past medical records: yes Discuss  the patient with other providers: yes Independent visualization of images, tracings, or specimens: yes  Risk of Complications, Morbidity, and/or Mortality Presenting problems: high Diagnostic procedures: high Management options: high  Critical Care Total time providing critical care: 30-74 minutes (35)  Patient Progress Patient progress: stable  Final Clinical Impression(s) / ED Diagnoses Final diagnoses:  Transient alteration of awareness     Carmin Muskrat, MD 04/28/20 1520

## 2020-04-28 NOTE — Code Documentation (Signed)
Stroke Response Nurse Documentation Code Documentation  Tricia [place] is a 65 y.o. male arriving to [facility] ED via [facility] on 04/28/2020 with past medical hx of prostate cancer, chronic back pain. Code stroke was activated by EMS. Patient from work where he was LKW at 1020 and now complaining of altered mental status. Prior to his mental status change he was reported to be unresponsive. En route EMS noted him to be hypertensive, complaining of chest pain, and limited speech. On No antithrombotic. Stroke team at the bedside on patient arrival. Labs drawn and patient cleared for CT by Dr. Vanita Panda. Patient to CT with team. NIHSS 4, see documentation for details and code stroke times. Patient with bilateral leg weakness on exam. The following imaging was completed: CT, CTA head and neck. Patient is not a candidate for tPA due to symptoms improving.   Care/Plan: q2h NIHSS and VS Bedside handoff with ED RN [place]  Rapid Response RN

## 2020-04-28 NOTE — ED Triage Notes (Signed)
Pt came via [facility] from work. Pt exhibited N/V at 10am  and  became nonverbal at 1020am work.pt had a sudden  onset of confusion.currently pt responding to commands appropriately.

## 2020-04-28 NOTE — ED Notes (Signed)
Patient transported to CT 

## 2020-04-28 NOTE — Consult Note (Signed)
Stroke Neurology Consultation Note  Consult Requested by: Dr. Jeraldine Loots  Reason for Consult: code strke  Consult Date: 04/28/20   The history was obtained from the EMS and chart.  During history and examination, all items were not able to obtain unless otherwise noted.  History of Present Illness:  Harold Edwards is a 65 y.o. Caucasian male with PMH of prostate cancer s/p prostatectomy and XRT, hyperlipidemia, sinus tachycardia (not taking his metoprolol) presented to ER for code stroke.  Per pt, he has been having diarrhea for the last 2-3 days. He did not feel well this morning with some nausea and vomiting as well as diarrhea. But he was able to go to work. However, around 10am he had vomiting again at bathroom and around 10:20am his coworker found him to be confused and not talking. EMS called and on arrival BP 180/112 and glucose 139. He complained HA, neck pain as well as chest pain and difficulty breathing. He was sent to ER for code stroke.   I met pt at bridge and pt lethargic and seemed to have extreme epigastric pain and SOB. He denies HA but endorse mild neck pain. He has no focal deficit although paucity of speech but following commands and able to name and repeat, and orientated to place and age but not to time. CT no acute abnormality. CTA head and neck performed also to rule out aortic dissection which was negative.   After CT, pt sent back to room. Pt became more awake alert and epigastric pain and SOB improved some. He was able to tell more of the history today and mental status also much improved. EKG showed sinus tachy, no STT change.   LSN: 10am tPA Given: No: nonfocal and likely not stroke  Past Medical History:  Diagnosis Date  . Back pain, chronic   . Cancer (HCC)   . Chronic cough 09/19/2017  . Influenza   . Pneumonia   . Prostate cancer (HCC) 02/21/2014   4+4=8 s/p TRUS biopsy by Dr. Edwyna Shell on 01/18/14  Last Assessment & Plan:   Patient be managed by urology.  Patient  is status post prostatectomy and radiation therapy.  Denies hematuria.  . Sinus tachycardia 09/13/2017  . Stress incontinence 04/10/2014    Past Surgical History:  Procedure Laterality Date  . ABDOMINAL SURGERY    . PROSTATE SURGERY     Prostatectomy robot. Dr Zachery Conch and Dr Andrey Campanile  . TONSILLECTOMY      Family History  Problem Relation Age of Onset  . Breast cancer Other   . Diabetes Other   . Cancer Mother   . Diabetes Mother   . Cancer Father   . Diabetes Father     Social History:  reports that he quit smoking about 31 years ago. His smoking use included cigarettes. He started smoking about 46 years ago. He has a 13.00 pack-year smoking history. He has never used smokeless tobacco. He reports current alcohol use of about 3.0 standard drinks of alcohol per week. He reports that he does not use drugs.  Allergies:  Allergies  Allergen Reactions  . Amoxicillin Diarrhea    Stomach cramps    No current facility-administered medications on file prior to encounter.   Current Outpatient Medications on File Prior to Encounter  Medication Sig Dispense Refill  . azithromycin (ZITHROMAX Z-PAK) 250 MG tablet Take 2 tabs today; then begin one tab once daily for 4 more days. 6 tablet 0  . gabapentin (NEURONTIN) 300 MG capsule One  tab PO qHS for a week, then BID for a week, then TID. May double weekly to a max of 3,$Remove'600mg'exUljyf$ /day 270 capsule 1  . metoprolol succinate (TOPROL-XL) 50 MG 24 hr tablet Take 1 tablet (50 mg total) by mouth daily. Take with or immediately following a meal. 90 tablet 3  . predniSONE (DELTASONE) 50 MG tablet One tab PO daily for 5 days. 5 tablet 0    Review of Systems: A full ROS was attempted today and was able to be performed.  Systems assessed include - Constitutional, Eyes, HENT, Respiratory, Cardiovascular, Gastrointestinal, Genitourinary, Integument/breast, Hematologic/lymphatic, Musculoskeletal, Neurological, Behavioral/Psych, Endocrine, Allergic/Immunologic - with  pertinent responses as per HPI.  Physical Examination: Temp:  [98.1 F (36.7 C)] 98.1 F (36.7 C) (03/21 1203) Pulse Rate:  [106-119] 109 (03/21 1530) Resp:  [10-26] 10 (03/21 1530) BP: (107-122)/(67-79) 119/73 (03/21 1530) SpO2:  [93 %-98 %] 96 % (03/21 1530) Weight:  [98.2 kg] 98.2 kg (03/21 1204)  General - well nourished, well developed, mild lethargy.    Ophthalmologic - fundi not visualized due to noncooperation.    Cardiovascular - regular rhythm and tachycardia  Mental Status -  Level of arousal and orientation to place, and person were intact, but not to time initially but later orientated to time. Language including expression, naming, repetition, comprehension was assessed and found intact. Mild dysarthria due to epigastric pain  Cranial Nerves II - XII - II - Vision intact OU. III, IV, VI - Extraocular movements intact. V - Facial sensation intact bilaterally. VII - Facial movement intact bilaterally. VIII - Hearing & vestibular intact bilaterally. X - Palate elevates symmetrically. XI - Chin turning & shoulder shrug intact bilaterally. XII - Tongue protrusion intact.  Motor Strength - The patient's strength was symmetrical in all extremities and pronator drift was absent.   Motor Tone & Bulk - Muscle tone was assessed at the neck and appendages and was normal.  Bulk was normal and fasciculations were absent.   Reflexes - The patient's reflexes were normal in all extremities and he had no pathological reflexes.  Sensory - Light touch, temperature/pinprick were assessed and were normal.    Coordination - The patient had normal movements in the hands and feet with no ataxia or dysmetria.  Tremor was absent.  Gait and Station - deferred  Data Reviewed: CT ABDOMEN PELVIS WO CONTRAST  Result Date: 04/28/2020 CLINICAL DATA:  Abdominal pain, acute nonlocalized. History of diarrheal episodes. EXAM: CT ABDOMEN AND PELVIS WITHOUT CONTRAST TECHNIQUE: Multidetector CT  imaging of the abdomen and pelvis was performed following the standard protocol without IV contrast. COMPARISON:  CT angiography of the head and neck of the same date. FINDINGS: Lower chest: Basilar atelectasis.  No effusion.  No consolidation. Hepatobiliary: Hepatic steatosis and low-density hepatic lesions that are likely small cysts. No pericholecystic stranding. No gross biliary duct distension. Pancreas: No pancreatic contour abnormality. No sign of peripancreatic inflammation. Spleen: Spleen normal size and contour. Adrenals/Urinary Tract: Adrenal glands are normal. No perinephric stranding. No hydronephrosis. Excreted contrast from the recent CT angiography. Stomach/Bowel: Stomach moderately distended. No perigastric stranding. No bowel obstruction though there are mildly dilated loops of small bowel scattered about the abdomen. Mild mesenteric haziness and scattered small lymph nodes throughout the jejunal mesentery. No pneumatosis. Appendix not visualized. No secondary signs to suggest acute appendicitis Vascular/Lymphatic: Normal caliber abdominal aorta with scattered atheromatous plaque. There is no gastrohepatic or hepatoduodenal ligament lymphadenopathy. No retroperitoneal or mesenteric lymphadenopathy. No pelvic sidewall lymphadenopathy. Reproductive: Post prostatectomy.  Other: No ascites.  No free air. Musculoskeletal: Spinal degenerative changes. No acute or destructive bone process. Sclerotic focus in the LEFT sacrum, densely sclerotic measuring 7 mm. Suggested on previous imaging from 2020, lumbar spine plain films. Appearance most suggestive of small bone island but with patient history is of uncertain significance. IMPRESSION: 1. Mildly distended fluid-filled loops of small bowel in the mid small bowel may reflect sequela of gastroenteritis. No acute findings in the abdomen or pelvis. 2. Mild haziness in jejunal mesentery reported on studies of 2011, images not available. Findings in the absence  of splenomegaly or frank adenopathy or likely related to prior mesenteric inflammation. 3. Hepatic steatosis and low-density hepatic lesions that are likely small cysts. 4. Sclerosis in the LEFT hemi sacrum with focal and dense characteristics favored to represent a small bone island suggested on previous imaging as far back as 2020. Comparison with more remote priors may be helpful to determine significance in this patient with history of prostate cancer. 5. Post prostatectomy. 6. Aortic atherosclerosis. Aortic Atherosclerosis (ICD10-I70.0). Electronically Signed   By: Zetta Bills M.D.   On: 04/28/2020 14:31   CT ANGIO HEAD W OR WO CONTRAST  Result Date: 04/28/2020 CLINICAL DATA:  Stroke, TIA EXAM: CT HEAD WITHOUT CONTRAST CT ANGIOGRAPHY OF THE HEAD AND NECK TECHNIQUE: Contiguous axial images were obtained from the base of the skull through the vertex without intravenous contrast. Multidetector CT imaging of the head and neck was performed using the standard protocol during bolus administration of intravenous contrast. Multiplanar CT image reconstructions and MIPs were obtained to evaluate the vascular anatomy. Carotid stenosis measurements (when applicable) are obtained utilizing NASCET criteria, using the distal internal carotid diameter as the denominator. CONTRAST:  75 mL Omnipaque 350 COMPARISON:  None. FINDINGS: CT HEAD Brain: There is no acute intracranial hemorrhage, mass effect, or edema. Gray-white differentiation is preserved. There is no extra-axial fluid collection. Ventricles and sulci are within normal limits in size and configuration. Vascular: No hyperdense vessel or unexpected calcification. Skull: Calvarium is unremarkable. Sinuses/Orbits: No acute finding. Other: None. ASPECTS ([place] Stroke Program Early CT Score) - Ganglionic level infarction (caudate, lentiform nuclei, internal capsule, insula, M1-M3 cortex): 7 - Supraganglionic infarction (M4-M6 cortex): 3 Total score (0-10 with 10  being normal): 10 Review of the MIP images confirms the above findings CTA NECK Aortic arch: Great vessel origins are patent. Right carotid system: Patent.  No stenosis at the ICA origin. Left carotid system: Patent.  No stenosis at the ICA origin. Vertebral arteries: Patent. Left vertebral artery is dominant. No stenosis. Skeleton: Mild cervical spine degenerative changes. Other neck: Unremarkable. Upper chest: No apical lung mass. Review of the MIP images confirms the above findings CTA HEAD Anterior circulation: Intracranial internal carotid arteries patent. Anterior and middle cerebral arteries are patent. Posterior circulation: Intracranial vertebral arteries are patent. Right vertebral artery becomes diminutive after PICA origin. Basilar artery is patent. Major cerebellar artery origins are patent. Posterior cerebral arteries are patent. There are bilateral posterior communicating arteries. Venous sinuses: Patent as allowed by contrast bolus timing. Review of the MIP images confirms the above findings IMPRESSION: There is no acute intracranial hemorrhage or evidence of acute infarction. ASPECT score is 10. No large vessel occlusion, hemodynamically significant stenosis, or evidence of dissection. Electronically Signed   By: Macy Mis M.D.   On: 04/28/2020 11:44   CT ANGIO NECK W OR WO CONTRAST  Result Date: 04/28/2020 CLINICAL DATA:  Stroke, TIA EXAM: CT HEAD WITHOUT CONTRAST CT ANGIOGRAPHY OF  THE HEAD AND NECK TECHNIQUE: Contiguous axial images were obtained from the base of the skull through the vertex without intravenous contrast. Multidetector CT imaging of the head and neck was performed using the standard protocol during bolus administration of intravenous contrast. Multiplanar CT image reconstructions and MIPs were obtained to evaluate the vascular anatomy. Carotid stenosis measurements (when applicable) are obtained utilizing NASCET criteria, using the distal internal carotid diameter as the  denominator. CONTRAST:  75 mL Omnipaque 350 COMPARISON:  None. FINDINGS: CT HEAD Brain: There is no acute intracranial hemorrhage, mass effect, or edema. Gray-white differentiation is preserved. There is no extra-axial fluid collection. Ventricles and sulci are within normal limits in size and configuration. Vascular: No hyperdense vessel or unexpected calcification. Skull: Calvarium is unremarkable. Sinuses/Orbits: No acute finding. Other: None. ASPECTS ([place] Stroke Program Early CT Score) - Ganglionic level infarction (caudate, lentiform nuclei, internal capsule, insula, M1-M3 cortex): 7 - Supraganglionic infarction (M4-M6 cortex): 3 Total score (0-10 with 10 being normal): 10 Review of the MIP images confirms the above findings CTA NECK Aortic arch: Great vessel origins are patent. Right carotid system: Patent.  No stenosis at the ICA origin. Left carotid system: Patent.  No stenosis at the ICA origin. Vertebral arteries: Patent. Left vertebral artery is dominant. No stenosis. Skeleton: Mild cervical spine degenerative changes. Other neck: Unremarkable. Upper chest: No apical lung mass. Review of the MIP images confirms the above findings CTA HEAD Anterior circulation: Intracranial internal carotid arteries patent. Anterior and middle cerebral arteries are patent. Posterior circulation: Intracranial vertebral arteries are patent. Right vertebral artery becomes diminutive after PICA origin. Basilar artery is patent. Major cerebellar artery origins are patent. Posterior cerebral arteries are patent. There are bilateral posterior communicating arteries. Venous sinuses: Patent as allowed by contrast bolus timing. Review of the MIP images confirms the above findings IMPRESSION: There is no acute intracranial hemorrhage or evidence of acute infarction. ASPECT score is 10. No large vessel occlusion, hemodynamically significant stenosis, or evidence of dissection. Electronically Signed   By: Macy Mis M.D.   On:  04/28/2020 11:44   CT HEAD CODE STROKE WO CONTRAST  Result Date: 04/28/2020 CLINICAL DATA:  Stroke, TIA EXAM: CT HEAD WITHOUT CONTRAST CT ANGIOGRAPHY OF THE HEAD AND NECK TECHNIQUE: Contiguous axial images were obtained from the base of the skull through the vertex without intravenous contrast. Multidetector CT imaging of the head and neck was performed using the standard protocol during bolus administration of intravenous contrast. Multiplanar CT image reconstructions and MIPs were obtained to evaluate the vascular anatomy. Carotid stenosis measurements (when applicable) are obtained utilizing NASCET criteria, using the distal internal carotid diameter as the denominator. CONTRAST:  75 mL Omnipaque 350 COMPARISON:  None. FINDINGS: CT HEAD Brain: There is no acute intracranial hemorrhage, mass effect, or edema. Gray-white differentiation is preserved. There is no extra-axial fluid collection. Ventricles and sulci are within normal limits in size and configuration. Vascular: No hyperdense vessel or unexpected calcification. Skull: Calvarium is unremarkable. Sinuses/Orbits: No acute finding. Other: None. ASPECTS ([place] Stroke Program Early CT Score) - Ganglionic level infarction (caudate, lentiform nuclei, internal capsule, insula, M1-M3 cortex): 7 - Supraganglionic infarction (M4-M6 cortex): 3 Total score (0-10 with 10 being normal): 10 Review of the MIP images confirms the above findings CTA NECK Aortic arch: Great vessel origins are patent. Right carotid system: Patent.  No stenosis at the ICA origin. Left carotid system: Patent.  No stenosis at the ICA origin. Vertebral arteries: Patent. Left vertebral artery is dominant. No stenosis.  Skeleton: Mild cervical spine degenerative changes. Other neck: Unremarkable. Upper chest: No apical lung mass. Review of the MIP images confirms the above findings CTA HEAD Anterior circulation: Intracranial internal carotid arteries patent. Anterior and middle cerebral  arteries are patent. Posterior circulation: Intracranial vertebral arteries are patent. Right vertebral artery becomes diminutive after PICA origin. Basilar artery is patent. Major cerebellar artery origins are patent. Posterior cerebral arteries are patent. There are bilateral posterior communicating arteries. Venous sinuses: Patent as allowed by contrast bolus timing. Review of the MIP images confirms the above findings IMPRESSION: There is no acute intracranial hemorrhage or evidence of acute infarction. ASPECT score is 10. No large vessel occlusion, hemodynamically significant stenosis, or evidence of dissection. Electronically Signed   By: Macy Mis M.D.   On: 04/28/2020 11:44    Assessment: 65 y.o. male with PMH of prostate cancer s/p prostatectomy and XRT, hyperlipidemia, sinus tachycardia (not taking his metoprolol) presented to ER for code stroke. Pt has been having diarrhea nausea and vomiting. Around 10:20am he was found to have confusion and withdrawn after a bout of N/V and diarrhea. EMS called for code stroke. On exam, pt lethargic, mild dysarthria due to extreme epigastric pain and SOB. Not orientated to time likely due to encephalopathy with pain and SOB. no focal deficit. CT no acute abnormality. CTA head and neck performed also to rule out aortic dissection which was negative. After CT, pt became more awake alert and epigastric pain and SOB improved some. He was able to tell more of the history today and mental status also much improved. EKG showed sinus tachy, no STT change.   Pt symptoms more concentrated on N/V, diarrhea, epigastric pain and SOB associated with pain. He also had encephalopathy from above that caused some confusion, dysarthria likely due to epigastric pain and SOB. No focal deficit seen. Neuro imaging reassuring. Currently no concerning for stroke, seizure or TIA. Recommend further work up and treatment for epigastric pain, N/V and diarrhea per primary team.     Plan: - vital signs - Telemetry monitoring - Recommend further work up and treatment for epigastric pain, N/V and diarrhea per primary team. - Currently no concern for stroke, seizure or TIA from neuro standpoint. - will sign off for now, please feel free to call with further questions.   Thank you for this consultation and allowing [place] to participate in the care of this patient.  Rosalin Hawking, MD PhD Stroke Neurology 04/28/2020 4:50 PM

## 2020-04-29 DIAGNOSIS — E876 Hypokalemia: Secondary | ICD-10-CM | POA: Diagnosis not present

## 2020-04-29 DIAGNOSIS — G9341 Metabolic encephalopathy: Secondary | ICD-10-CM | POA: Diagnosis not present

## 2020-04-29 DIAGNOSIS — I700 Atherosclerosis of aorta: Secondary | ICD-10-CM | POA: Diagnosis not present

## 2020-04-29 DIAGNOSIS — K529 Noninfective gastroenteritis and colitis, unspecified: Secondary | ICD-10-CM | POA: Diagnosis not present

## 2020-04-29 LAB — CBC
HCT: 41.5 % (ref 39.0–52.0)
Hemoglobin: 14 g/dL (ref 13.0–17.0)
MCH: 29 pg (ref 26.0–34.0)
MCHC: 33.7 g/dL (ref 30.0–36.0)
MCV: 86.1 fL (ref 80.0–100.0)
Platelets: 215 10*3/uL (ref 150–400)
RBC: 4.82 MIL/uL (ref 4.22–5.81)
RDW: 13.7 % (ref 11.5–15.5)
WBC: 4.9 10*3/uL (ref 4.0–10.5)
nRBC: 0 % (ref 0.0–0.2)

## 2020-04-29 LAB — BASIC METABOLIC PANEL WITH GFR
Anion gap: 8 (ref 5–15)
BUN: 15 mg/dL (ref 8–23)
CO2: 23 mmol/L (ref 22–32)
Calcium: 8.7 mg/dL — ABNORMAL LOW (ref 8.9–10.3)
Chloride: 108 mmol/L (ref 98–111)
Creatinine, Ser: 1 mg/dL (ref 0.61–1.24)
GFR, Estimated: >60 mL/min
Glucose, Bld: 110 mg/dL — ABNORMAL HIGH (ref 70–99)
Potassium: 3.4 mmol/L — ABNORMAL LOW (ref 3.5–5.1)
Sodium: 139 mmol/L (ref 135–145)

## 2020-04-29 LAB — LIPASE, BLOOD: Lipase: 22 U/L (ref 11–51)

## 2020-04-29 MED ORDER — ATORVASTATIN CALCIUM 20 MG PO TABS: 20.0000 mg | ORAL_TABLET | Freq: Every day | ORAL | 1 refills | Status: DC

## 2020-04-29 MED ORDER — POTASSIUM CHLORIDE CRYS ER 20 MEQ PO TBCR
40.0000 meq | EXTENDED_RELEASE_TABLET | Freq: Once | ORAL | Status: AC
  Administered 2020-04-29: 40 meq via ORAL
  Filled 2020-04-29: qty 2

## 2020-04-29 MED ORDER — METOPROLOL SUCCINATE ER 50 MG PO TB24: 50.0000 mg | ORAL_TABLET | Freq: Every day | ORAL | 2 refills | Status: DC

## 2020-04-29 NOTE — Plan of Care (Signed)
  Problem: Education: Goal: Knowledge of General Education information will improve Description: Including pain rating scale, medication(s)/side effects and non-pharmacologic comfort measures 04/29/2020 0034 by Aleigh Grunden J, RN Outcome: Progressing 04/29/2020 0034 by Carmelia Tiner J, RN Outcome: Progressing   Problem: Health Behavior/Discharge Planning: Goal: Ability to manage health-related needs will improve 04/29/2020 0034 by Marissia Blackham J, RN Outcome: Progressing 04/29/2020 0034 by Shelva Hetzer J, RN Outcome: Progressing   Problem: Clinical Measurements: Goal: Ability to maintain clinical measurements within normal limits will improve 04/29/2020 0034 by Markies Mowatt J, RN Outcome: Progressing 04/29/2020 0034 by Rebacca Votaw J, RN Outcome: Progressing Goal: Will remain free from infection 04/29/2020 0034 by Daritza Brees J, RN Outcome: Progressing 04/29/2020 0034 by Dallana Mavity J, RN Outcome: Progressing Goal: Diagnostic test results will improve 04/29/2020 0034 by Nashira Mcglynn J, RN Outcome: Progressing 04/29/2020 0034 by Tarek Cravens J, RN Outcome: Progressing Goal: Respiratory complications will improve 04/29/2020 0034 by Cameshia Cressman J, RN Outcome: Progressing 04/29/2020 0034 by Nereyda Bowler J, RN Outcome: Progressing Goal: Cardiovascular complication will be avoided 04/29/2020 0034 by Kuron Docken, Arti Trang J, RN Outcome: Progressing 04/29/2020 0034 by Vannie Hochstetler J, RN Outcome: Progressing   Problem: Activity: Goal: Risk for activity intolerance will decrease 04/29/2020 0034 by Josiah Nieto J, RN Outcome: Progressing 04/29/2020 0034 by Marten Iles J, RN Outcome: Progressing   Problem: Nutrition: Goal: Adequate nutrition will be maintained 04/29/2020 0034 by Jezebel Pollet J, RN Outcome: Progressing 04/29/2020 0034 by Dudley Cooley J, RN Outcome: Progressing   Problem: Coping: Goal: Level of anxiety will decrease 04/29/2020 0034 by Baby Stairs,  Jannie Doyle J, RN Outcome: Progressing 04/29/2020 0034 by Tomio Kirk J, RN Outcome: Progressing   Problem: Elimination: Goal: Will not experience complications related to bowel motility 04/29/2020 0034 by Joscelyne Renville J, RN Outcome: Progressing 04/29/2020 0034 by Jakelyn Squyres J, RN Outcome: Progressing Goal: Will not experience complications related to urinary retention 04/29/2020 0034 by Anzal Bartnick J, RN Outcome: Progressing 04/29/2020 0034 by Glee Lashomb J, RN Outcome: Progressing   Problem: Pain Managment: Goal: General experience of comfort will improve 04/29/2020 0034 by Sabrin Dunlevy J, RN Outcome: Progressing 04/29/2020 0034 by Anastasia Tompson J, RN Outcome: Progressing   Problem: Safety: Goal: Ability to remain free from injury will improve 04/29/2020 0034 by Nora Sabey J, RN Outcome: Progressing 04/29/2020 0034 by Eugena Rhue J, RN Outcome: Progressing   Problem: Skin Integrity: Goal: Risk for impaired skin integrity will decrease 04/29/2020 0034 by Petr Bontempo J, RN Outcome: Progressing 04/29/2020 0034 by Ahmari Garton J, RN Outcome: Progressing   

## 2020-04-29 NOTE — Discharge Summary (Signed)
Physician Discharge Summary  Harold Edwards CLE:[id number] DOB: 18-Mar-1955 DOA: 04/28/2020  PCP: Donella Stade, PA-C  Admit date: 04/28/2020 Discharge date: 04/29/2020  Time spent: 50 minutes  Recommendations for Outpatient Follow-up:  1. Follow-up with Donella Stade, PA-C on 05/14/2020.  On follow-up patient will need a basic metabolic profile done to follow-up on electrolytes and renal function.  Patient will need follow-up on history of sinus tachycardia and further management of his home medications. 2.    Discharge Diagnoses:  Principal Problem:   AMS (altered mental status) Active Problems:   History of prostate cancer   Gastroenteritis   Aortic atherosclerosis (HCC)   Acute metabolic encephalopathy   Hypokalemia   Discharge Condition: Stable and improved  Diet recommendation: Heart healthy  Filed Weights   04/28/20 1100 04/28/20 1204  Weight: 98.2 kg 98.2 kg    History of present illness:  HPI per Dr. Neysa Bonito  This is a 65 year old male with past medical history of prostate cancer s/p prostatectomy and XRT, hyperlipidemia, sinus tachycardia (not taking his metoprolol), stress incontinence who presented to the ED via EMS as a code stroke.  Patient was last seen normal about 80 minutes prior to ED arrival.  Patient stated that this AM he was not feeling well and had nausea, non bloody vomiting and non bloody diarrhea. 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 Per prior notes, he was at work when he suddenly became diaphoretic and withdrawn and had several episodes of vomiting and described headache, neck pain and chest pain to coworkers. EMS was contacted by his work. The patient does not recall this. Per EMS, patient was hypertensive, diaphoretic with word finding difficulty and slowness to respond. He says he still has some epigastric discomfort but otherwise feels back to normal and without any other  complaints. He denies a history of stroke, migraines or seizures. He does not recall if he lost consciousness.    ED Course: Afebrile, tachycardic, hemodynamically stable, on room air. Notable Labs: Sodium 139, K3.9, glucose 135, BUN 24, creatinine 0.96, WBC 8.5, Hb 15.8, platelets 217, troponin 3, D-dimer 0.28, COVID-19 and flu negative. Notable Imaging: CT head without contrast-unremarkable.  CTA head and neck unremarkable.  CT abdomen pelvis without contrast-mildly distended fluid-filled loops of small bowel which may reflect gastroenteritis.  ED provider consulted neurology  Hospital Course:  1 acute metabolic encephalopathy Likely secondary to probable viral gastroenteritis in the setting of dehydration. Patient was admitted with acute metabolic encephalopathy on initial presentation there was concern for seizure versus TIA versus CVA.  CT head, CT angiogram head and neck which were done were unremarkable with no acute abnormalities no large vessel occlusion, stenosis or dissection noted.  CT abdomen and pelvis which was done was consistent with a gastroenteritis.  Patient on presentation had improved clinically and was back to baseline.  Neurology was consulted who assessed the patient and felt patient's symptoms were more consistent with a gastroenteritis and felt no further neurological work-up needed at this time. Patient hydrated with IV fluids, improved clinically and was back to baseline by day of discharge.  Patient was discharged in stable and improved condition.  2.  Nausea vomiting diarrhea likely secondary to viral gastroenteritis Patient had presented with a 2-day history of nausea vomiting diarrhea and some abdominal pain. -Patient did state his daughter and wife had similar symptoms which has subsequently resolved. -COVID-19 PCR which was  done was negative. -Patient improved clinically had no further nausea vomiting or diarrhea during the hospitalization. -Stool studies were  ordered however unable to be done as patient had no further diarrhea. -Patient hydrated with IV fluids. -Patient be discharged home in stable and improved condition.  3.  Sinus tachycardia -Patient with a prior history of sinus tachycardia.  Noted to be noncompliant with beta-blocker at home as patient stated had run out of medications and had no refills. -Patient with no PCP. -Toprol-XL was resumed at his prior home dose with resolution of sinus tachycardia. -Outpatient follow-up with PCP.  4.  Hyperlipidemia/aortic atherosclerosis Fasting lipid panel done with LDL of 119. -Patient will be started on a statin on discharge. -Outpatient follow-up.  5.  Hypokalemia Secondary to GI losses.  Potassium repleted.  Outpatient follow-up.  Procedures:  CT abdomen and pelvis 04/28/2020  CT angiogram head and neck 04/28/2020  CT head 04/28/2020  Consultations:  Neurology: Dr. Erlinda Hong 04/28/2020  Discharge Exam: Vitals:   04/29/20 0346 04/29/20 0951  BP: 114/85 122/79  Pulse: 93 93  Resp: 20 18  Temp: 98.1 F (36.7 C) 97.8 F (36.6 C)  SpO2: 96% 98%    General: NAD Cardiovascular: RRR no murmurs rubs or gallops Respiratory: CTA B  Discharge Instructions   Discharge Instructions    Diet - low sodium heart healthy   Complete by: As directed    Increase activity slowly   Complete by: As directed      Allergies as of 04/29/2020      Reactions   Amoxicillin Diarrhea   Stomach cramps      Medication List    STOP taking these medications   gabapentin 300 MG capsule Commonly known as: NEURONTIN     TAKE these medications   atorvastatin 20 MG tablet Commonly known as: Lipitor Take 1 tablet (20 mg total) by mouth daily.   metoprolol succinate 50 MG 24 hr tablet Commonly known as: TOPROL-XL Take 1 tablet (50 mg total) by mouth daily. Take with or immediately following a meal.      Allergies  Allergen Reactions  . Amoxicillin Diarrhea    Stomach cramps    Follow-up  Information    Donella Stade, PA-C Follow up on 05/14/2020.   Specialty: Family Medicine Why: Your appointment is at 1:30 pm. Please arrive early and bring: ID, insurance card and current medications Contact information: [phone] [place] [address] [phone]                The results of significant diagnostics from this hospitalization (including imaging, microbiology, ancillary and laboratory) are listed below for reference.    Significant Diagnostic Studies: CT ABDOMEN PELVIS WO CONTRAST  Result Date: 04/28/2020 CLINICAL DATA:  Abdominal pain, acute nonlocalized. History of diarrheal episodes. EXAM: CT ABDOMEN AND PELVIS WITHOUT CONTRAST TECHNIQUE: Multidetector CT imaging of the abdomen and pelvis was performed following the standard protocol without IV contrast. COMPARISON:  CT angiography of the head and neck of the same date. FINDINGS: Lower chest: Basilar atelectasis.  No effusion.  No consolidation. Hepatobiliary: Hepatic steatosis and low-density hepatic lesions that are likely small cysts. No pericholecystic stranding. No gross biliary duct distension. Pancreas: No pancreatic contour abnormality. No sign of peripancreatic inflammation. Spleen: Spleen normal size and contour. Adrenals/Urinary Tract: Adrenal glands are normal. No perinephric stranding. No hydronephrosis. Excreted contrast from the recent CT angiography. Stomach/Bowel: Stomach moderately distended. No perigastric stranding. No bowel obstruction though there are mildly  dilated loops of small bowel scattered about the abdomen. Mild mesenteric haziness and scattered small lymph nodes throughout the jejunal mesentery. No pneumatosis. Appendix not visualized. No secondary signs to suggest acute appendicitis Vascular/Lymphatic: Normal caliber abdominal aorta with scattered atheromatous plaque. There is no gastrohepatic or hepatoduodenal ligament lymphadenopathy. No retroperitoneal or mesenteric  lymphadenopathy. No pelvic sidewall lymphadenopathy. Reproductive: Post prostatectomy. Other: No ascites.  No free air. Musculoskeletal: Spinal degenerative changes. No acute or destructive bone process. Sclerotic focus in the LEFT sacrum, densely sclerotic measuring 7 mm. Suggested on previous imaging from 2020, lumbar spine plain films. Appearance most suggestive of small bone island but with patient history is of uncertain significance. IMPRESSION: 1. Mildly distended fluid-filled loops of small bowel in the mid small bowel may reflect sequela of gastroenteritis. No acute findings in the abdomen or pelvis. 2. Mild haziness in jejunal mesentery reported on studies of 2011, images not available. Findings in the absence of splenomegaly or frank adenopathy or likely related to prior mesenteric inflammation. 3. Hepatic steatosis and low-density hepatic lesions that are likely small cysts. 4. Sclerosis in the LEFT hemi sacrum with focal and dense characteristics favored to represent a small bone island suggested on previous imaging as far back as 2020. Comparison with more remote priors may be helpful to determine significance in this patient with history of prostate cancer. 5. Post prostatectomy. 6. Aortic atherosclerosis. Aortic Atherosclerosis (ICD10-I70.0). Electronically Signed   By: Zetta Bills M.D.   On: 04/28/2020 14:31   CT ANGIO HEAD W OR WO CONTRAST  Result Date: 04/28/2020 CLINICAL DATA:  Stroke, TIA EXAM: CT HEAD WITHOUT CONTRAST CT ANGIOGRAPHY OF THE HEAD AND NECK TECHNIQUE: Contiguous axial images were obtained from the base of the skull through the vertex without intravenous contrast. Multidetector CT imaging of the head and neck was performed using the standard protocol during bolus administration of intravenous contrast. Multiplanar CT image reconstructions and MIPs were obtained to evaluate the vascular anatomy. Carotid stenosis measurements (when applicable) are obtained utilizing NASCET  criteria, using the distal internal carotid diameter as the denominator. CONTRAST:  75 mL Omnipaque 350 COMPARISON:  None. FINDINGS: CT HEAD Brain: There is no acute intracranial hemorrhage, mass effect, or edema. Gray-white differentiation is preserved. There is no extra-axial fluid collection. Ventricles and sulci are within normal limits in size and configuration. Vascular: No hyperdense vessel or unexpected calcification. Skull: Calvarium is unremarkable. Sinuses/Orbits: No acute finding. Other: None. ASPECTS ([place] Stroke Program Early CT Score) - Ganglionic level infarction (caudate, lentiform nuclei, internal capsule, insula, M1-M3 cortex): 7 - Supraganglionic infarction (M4-M6 cortex): 3 Total score (0-10 with 10 being normal): 10 Review of the MIP images confirms the above findings CTA NECK Aortic arch: Great vessel origins are patent. Right carotid system: Patent.  No stenosis at the ICA origin. Left carotid system: Patent.  No stenosis at the ICA origin. Vertebral arteries: Patent. Left vertebral artery is dominant. No stenosis. Skeleton: Mild cervical spine degenerative changes. Other neck: Unremarkable. Upper chest: No apical lung mass. Review of the MIP images confirms the above findings CTA HEAD Anterior circulation: Intracranial internal carotid arteries patent. Anterior and middle cerebral arteries are patent. Posterior circulation: Intracranial vertebral arteries are patent. Right vertebral artery becomes diminutive after PICA origin. Basilar artery is patent. Major cerebellar artery origins are patent. Posterior cerebral arteries are patent. There are bilateral posterior communicating arteries. Venous sinuses: Patent as allowed by contrast bolus timing. Review of the MIP images confirms the above findings IMPRESSION: There is no acute  intracranial hemorrhage or evidence of acute infarction. ASPECT score is 10. No large vessel occlusion, hemodynamically significant stenosis, or evidence of  dissection. Electronically Signed   By: Macy Mis M.D.   On: 04/28/2020 11:44   CT ANGIO NECK W OR WO CONTRAST  Result Date: 04/28/2020 CLINICAL DATA:  Stroke, TIA EXAM: CT HEAD WITHOUT CONTRAST CT ANGIOGRAPHY OF THE HEAD AND NECK TECHNIQUE: Contiguous axial images were obtained from the base of the skull through the vertex without intravenous contrast. Multidetector CT imaging of the head and neck was performed using the standard protocol during bolus administration of intravenous contrast. Multiplanar CT image reconstructions and MIPs were obtained to evaluate the vascular anatomy. Carotid stenosis measurements (when applicable) are obtained utilizing NASCET criteria, using the distal internal carotid diameter as the denominator. CONTRAST:  75 mL Omnipaque 350 COMPARISON:  None. FINDINGS: CT HEAD Brain: There is no acute intracranial hemorrhage, mass effect, or edema. Gray-white differentiation is preserved. There is no extra-axial fluid collection. Ventricles and sulci are within normal limits in size and configuration. Vascular: No hyperdense vessel or unexpected calcification. Skull: Calvarium is unremarkable. Sinuses/Orbits: No acute finding. Other: None. ASPECTS ([place] Stroke Program Early CT Score) - Ganglionic level infarction (caudate, lentiform nuclei, internal capsule, insula, M1-M3 cortex): 7 - Supraganglionic infarction (M4-M6 cortex): 3 Total score (0-10 with 10 being normal): 10 Review of the MIP images confirms the above findings CTA NECK Aortic arch: Great vessel origins are patent. Right carotid system: Patent.  No stenosis at the ICA origin. Left carotid system: Patent.  No stenosis at the ICA origin. Vertebral arteries: Patent. Left vertebral artery is dominant. No stenosis. Skeleton: Mild cervical spine degenerative changes. Other neck: Unremarkable. Upper chest: No apical lung mass. Review of the MIP images confirms the above findings CTA HEAD Anterior circulation: Intracranial  internal carotid arteries patent. Anterior and middle cerebral arteries are patent. Posterior circulation: Intracranial vertebral arteries are patent. Right vertebral artery becomes diminutive after PICA origin. Basilar artery is patent. Major cerebellar artery origins are patent. Posterior cerebral arteries are patent. There are bilateral posterior communicating arteries. Venous sinuses: Patent as allowed by contrast bolus timing. Review of the MIP images confirms the above findings IMPRESSION: There is no acute intracranial hemorrhage or evidence of acute infarction. ASPECT score is 10. No large vessel occlusion, hemodynamically significant stenosis, or evidence of dissection. Electronically Signed   By: Macy Mis M.D.   On: 04/28/2020 11:44   CT HEAD CODE STROKE WO CONTRAST  Result Date: 04/28/2020 CLINICAL DATA:  Stroke, TIA EXAM: CT HEAD WITHOUT CONTRAST CT ANGIOGRAPHY OF THE HEAD AND NECK TECHNIQUE: Contiguous axial images were obtained from the base of the skull through the vertex without intravenous contrast. Multidetector CT imaging of the head and neck was performed using the standard protocol during bolus administration of intravenous contrast. Multiplanar CT image reconstructions and MIPs were obtained to evaluate the vascular anatomy. Carotid stenosis measurements (when applicable) are obtained utilizing NASCET criteria, using the distal internal carotid diameter as the denominator. CONTRAST:  75 mL Omnipaque 350 COMPARISON:  None. FINDINGS: CT HEAD Brain: There is no acute intracranial hemorrhage, mass effect, or edema. Gray-white differentiation is preserved. There is no extra-axial fluid collection. Ventricles and sulci are within normal limits in size and configuration. Vascular: No hyperdense vessel or unexpected calcification. Skull: Calvarium is unremarkable. Sinuses/Orbits: No acute finding. Other: None. ASPECTS [facility] Stroke Program Early CT Score) - Ganglionic level infarction  (caudate, lentiform nuclei, internal capsule, insula, M1-M3 cortex): 7 -  Supraganglionic infarction (M4-M6 cortex): 3 Total score (0-10 with 10 being normal): 10 Review of the MIP images confirms the above findings CTA NECK Aortic arch: Great vessel origins are patent. Right carotid system: Patent.  No stenosis at the ICA origin. Left carotid system: Patent.  No stenosis at the ICA origin. Vertebral arteries: Patent. Left vertebral artery is dominant. No stenosis. Skeleton: Mild cervical spine degenerative changes. Other neck: Unremarkable. Upper chest: No apical lung mass. Review of the MIP images confirms the above findings CTA HEAD Anterior circulation: Intracranial internal carotid arteries patent. Anterior and middle cerebral arteries are patent. Posterior circulation: Intracranial vertebral arteries are patent. Right vertebral artery becomes diminutive after PICA origin. Basilar artery is patent. Major cerebellar artery origins are patent. Posterior cerebral arteries are patent. There are bilateral posterior communicating arteries. Venous sinuses: Patent as allowed by contrast bolus timing. Review of the MIP images confirms the above findings IMPRESSION: There is no acute intracranial hemorrhage or evidence of acute infarction. ASPECT score is 10. No large vessel occlusion, hemodynamically significant stenosis, or evidence of dissection. Electronically Signed   By: Macy Mis M.D.   On: 04/28/2020 11:44    Microbiology: Recent Results (from the past 240 hour(s))  Resp Panel by RT-PCR (Flu A&B, Covid) Nasopharyngeal Swab     Status: None   Collection Time: 04/28/20 11:36 AM   Specimen: Nasopharyngeal Swab; Nasopharyngeal(NP) swabs in vial transport medium  Result Value Ref Range Status   SARS Coronavirus 2 by RT PCR NEGATIVE NEGATIVE Final    Comment: (NOTE) SARS-CoV-2 target nucleic acids are NOT DETECTED.  The SARS-CoV-2 RNA is generally detectable in upper respiratory specimens during the  acute phase of infection. The lowest concentration of SARS-CoV-2 viral copies this assay can detect is 138 copies/mL. A negative result does not preclude SARS-Cov-2 infection and should not be used as the sole basis for treatment or other patient management decisions. A negative result may occur with  improper specimen collection/handling, submission of specimen other than nasopharyngeal swab, presence of viral mutation(s) within the areas targeted by this assay, and inadequate number of viral copies(<138 copies/mL). A negative result must be combined with clinical observations, patient history, and epidemiological information. The expected result is Negative.  Fact Sheet for Patients:  EntrepreneurPulse.com.au  Fact Sheet for Healthcare Providers:  IncredibleEmployment.be  This test is no t yet approved or cleared by the [place] FDA and  has been authorized for detection and/or diagnosis of SARS-CoV-2 by FDA under an Emergency Use Authorization (EUA). This EUA will remain  in effect (meaning this test can be used) for the duration of the COVID-19 declaration under Section 564(b)(1) of the Act, 21 U.S.C.section 360bbb-3(b)(1), unless the authorization is terminated  or revoked sooner.       Influenza A by PCR NEGATIVE NEGATIVE Final   Influenza B by PCR NEGATIVE NEGATIVE Final    Comment: (NOTE) The Xpert Xpress SARS-CoV-2/FLU/RSV plus assay is intended as an aid in the diagnosis of influenza from Nasopharyngeal swab specimens and should not be used as a sole basis for treatment. Nasal washings and aspirates are unacceptable for Xpert Xpress SARS-CoV-2/FLU/RSV testing.  Fact Sheet for Patients: EntrepreneurPulse.com.au  Fact Sheet for Healthcare Providers: IncredibleEmployment.be  This test is not yet approved or cleared by the [place] FDA and has been authorized for detection and/or  diagnosis of SARS-CoV-2 by FDA under an Emergency Use Authorization (EUA). This EUA will remain in effect (meaning this test can be used) for the duration of  the COVID-19 declaration under Section 564(b)(1) of the Act, 21 U.S.C. section 360bbb-3(b)(1), unless the authorization is terminated or revoked.  Performed at [facility], [place] [address] [place], [place] [address]      Labs: Basic Metabolic Panel: Recent Labs  Lab 04/28/20 1118 04/28/20 1123 04/28/20 1527 04/29/20 0419  NA 139 141  --  139  K 3.9 3.8  --  3.4*  CL 108 108  --  108  CO2 25  --   --  23  GLUCOSE 135* 130*  --  110*  BUN 24* 27*  --  15  CREATININE 0.96 0.90  --  1.00  CALCIUM 8.9  --   --  8.7*  MG  --   --  2.1  --    Liver Function Tests: Recent Labs  Lab 04/28/20 1118  AST 22  ALT 25  ALKPHOS 54  BILITOT 1.1  PROT 7.2  ALBUMIN 4.1   Recent Labs  Lab 04/29/20 0419  LIPASE 22   No results for input(s): AMMONIA in the last 168 hours. CBC: Recent Labs  Lab 04/28/20 1118 04/28/20 1123 04/29/20 0419  WBC 8.5  --  4.9  NEUTROABS 7.3  --   --   HGB 15.8 16.0 14.0  HCT 48.3 47.0 41.5  MCV 88.1  --  86.1  PLT 217  --  215   Cardiac Enzymes: No results for input(s): CKTOTAL, CKMB, CKMBINDEX, TROPONINI in the last 168 hours. BNP: BNP (last 3 results) No results for input(s): BNP in the last 8760 hours.  ProBNP (last 3 results) No results for input(s): PROBNP in the last 8760 hours.  CBG: Recent Labs  Lab 04/28/20 1117  GLUCAP 127*       Signed:  Irine Seal MD.  [facility] 04/29/2020, 10:20 AM

## 2020-04-29 NOTE — Progress Notes (Signed)
Pt wheeled off unit by this RN. IV removed and CCMD notified of removal of tele. All of pt's belongings by his side during transport out.

## 2020-04-29 NOTE — TOC Transition Note (Signed)
Transition of Care [facility]) - CM/SW Discharge Note   Patient Details  Name: Khylan Sawyer MRN: [id number] Date of Birth: [birth year]  [facility] CM/SW Contact:  Pollie Friar, RN Phone Number: 04/29/2020, 10:00 AM   Clinical Narrative:    Patient is discharging home today with self care. Pt states no PCP. Pt is active with [facility]. Appointment obtained and placed on AVS.  Pt has transportation home.    Final next level of care: Home/Self Care Barriers to Discharge: No Barriers Identified   Patient Goals and CMS Choice        Discharge Placement                       Discharge Plan and Services                                     Social Determinants of Health (SDOH) Interventions     Readmission Risk Interventions No flowsheet data found.

## 2020-04-29 NOTE — Plan of Care (Signed)

## 2020-04-29 NOTE — Plan of Care (Signed)
  Problem: Education: Goal: Knowledge of General Education information will improve Description: Including pain rating scale, medication(s)/side effects and non-pharmacologic comfort measures 04/29/2020 0034 by Durwin Reges, RN Outcome: Progressing 04/29/2020 0034 by Durwin Reges, RN Outcome: Progressing   Problem: Health Behavior/Discharge Planning: Goal: Ability to manage health-related needs will improve 04/29/2020 0034 by Jasson Siegmann, Pamalee Leyden, RN Outcome: Progressing 04/29/2020 0034 by Durwin Reges, RN Outcome: Progressing   Problem: Clinical Measurements: Goal: Ability to maintain clinical measurements within normal limits will improve 04/29/2020 0034 by Durwin Reges, RN Outcome: Progressing 04/29/2020 0034 by Durwin Reges, RN Outcome: Progressing Goal: Will remain free from infection 04/29/2020 0034 by Durwin Reges, RN Outcome: Progressing 04/29/2020 0034 by Durwin Reges, RN Outcome: Progressing Goal: Diagnostic test results will improve 04/29/2020 0034 by Durwin Reges, RN Outcome: Progressing 04/29/2020 0034 by Durwin Reges, RN Outcome: Progressing Goal: Respiratory complications will improve 04/29/2020 0034 by Durwin Reges, RN Outcome: Progressing 04/29/2020 0034 by Durwin Reges, RN Outcome: Progressing Goal: Cardiovascular complication will be avoided 04/29/2020 0034 by Durwin Reges, RN Outcome: Progressing 04/29/2020 0034 by Durwin Reges, RN Outcome: Progressing   Problem: Activity: Goal: Risk for activity intolerance will decrease 04/29/2020 0034 by Durwin Reges, RN Outcome: Progressing 04/29/2020 0034 by Durwin Reges, RN Outcome: Progressing   Problem: Nutrition: Goal: Adequate nutrition will be maintained 04/29/2020 0034 by Durwin Reges, RN Outcome: Progressing 04/29/2020 0034 by Durwin Reges, RN Outcome: Progressing   Problem: Coping: Goal: Level of anxiety will decrease 04/29/2020 0034 by Durwin Reges, RN Outcome: Progressing 04/29/2020 0034 by Durwin Reges, RN Outcome: Progressing   Problem: Elimination: Goal: Will not experience complications related to bowel motility 04/29/2020 0034 by Durwin Reges, RN Outcome: Progressing 04/29/2020 0034 by Durwin Reges, RN Outcome: Progressing Goal: Will not experience complications related to urinary retention 04/29/2020 0034 by Durwin Reges, RN Outcome: Progressing 04/29/2020 0034 by Durwin Reges, RN Outcome: Progressing   Problem: Pain Managment: Goal: General experience of comfort will improve 04/29/2020 0034 by Durwin Reges, RN Outcome: Progressing 04/29/2020 0034 by Durwin Reges, RN Outcome: Progressing   Problem: Safety: Goal: Ability to remain free from injury will improve 04/29/2020 0034 by Durwin Reges, RN Outcome: Progressing 04/29/2020 0034 by Durwin Reges, RN Outcome: Progressing   Problem: Skin Integrity: Goal: Risk for impaired skin integrity will decrease 04/29/2020 0034 by Durwin Reges, RN Outcome: Progressing 04/29/2020 0034 by Durwin Reges, RN Outcome: Progressing

## 2020-05-01 ENCOUNTER — Telehealth: Payer: Self-pay | Admitting: Neurology

## 2020-05-01 NOTE — Telephone Encounter (Signed)
Patient's wife left vm that patient has appt scheduled for 05/14/2020 as a hospital follow up. See ED notes. Does he need seen sooner?  Patient's CB number (wife) [phone].

## 2020-05-02 NOTE — Telephone Encounter (Signed)
LMOM letting patient know that date for follow up is okay.

## 2020-05-02 NOTE — Telephone Encounter (Signed)
That's fine

## 2020-05-14 ENCOUNTER — Ambulatory Visit (INDEPENDENT_AMBULATORY_CARE_PROVIDER_SITE_OTHER): Payer: 59 | Admitting: Physician Assistant

## 2020-05-14 ENCOUNTER — Other Ambulatory Visit: Payer: Self-pay

## 2020-05-14 ENCOUNTER — Encounter: Payer: Self-pay | Admitting: Physician Assistant

## 2020-05-14 VITALS — BP 134/85 | HR 100 | Ht 73.0 in | Wt 214.0 lb

## 2020-05-14 DIAGNOSIS — R4182 Altered mental status, unspecified: Secondary | ICD-10-CM

## 2020-05-14 DIAGNOSIS — G9341 Metabolic encephalopathy: Secondary | ICD-10-CM | POA: Diagnosis not present

## 2020-05-14 DIAGNOSIS — R000 Tachycardia, unspecified: Secondary | ICD-10-CM | POA: Diagnosis not present

## 2020-05-14 DIAGNOSIS — M5416 Radiculopathy, lumbar region: Secondary | ICD-10-CM

## 2020-05-14 DIAGNOSIS — E876 Hypokalemia: Secondary | ICD-10-CM

## 2020-05-14 MED ORDER — PREDNISONE 50 MG PO TABS
ORAL_TABLET | ORAL | 0 refills | Status: DC
Start: 2020-05-14 — End: 2020-08-22

## 2020-05-14 MED ORDER — TRAMADOL HCL 50 MG PO TABS: 50.0000 mg | ORAL_TABLET | Freq: Two times a day (BID) | ORAL | 0 refills | Status: DC | PRN

## 2020-05-14 MED ORDER — CYCLOBENZAPRINE HCL 10 MG PO TABS: 10.0000 mg | ORAL_TABLET | Freq: Two times a day (BID) | ORAL | 2 refills | Status: DC | PRN

## 2020-05-14 MED ORDER — MELOXICAM 15 MG PO TABS: 15.0000 mg | ORAL_TABLET | Freq: Every day | ORAL | 1 refills | Status: DC

## 2020-05-14 NOTE — Progress Notes (Signed)
Subjective:    Patient ID: Harold Edwards, male    DOB: Jul 25, 1955, 65 y.o.   MRN: [id number]  HPI  Patient is a 65 year old male who presents to the clinic for hospital follow-up after ED admission for transient alteration of mental status on 04/28/2020.  Patient had full neurological and cardiac work-up.  Negative for any findings.  He did have a slightly low potassium level.  After fluids patient symptoms resolved.  It was suspected that he had symptoms and mild encephalopathy due to a virus and dehydration.  He has had no problems since discharge.  Patient continues to have severe chronic low back pain that radiates to the right side and into his right great toe and heel.  Patient has seen Ortho and had multiple epidural injections with minimal benefit.  Last year he was given some tramadol and Flexeril and he used that intermittently.  That seemed to help a lot.  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  He has no recent injury but overall his symptoms seem to be worsening.  He denies any bowel or bladder dysfunction, strength changes, saddle anesthesia.  [name]. Active Ambulatory Problems    Diagnosis Date Noted  . Chronic cough 09/19/2017  . ED (erectile dysfunction) 04/10/2014  . Medication side effects 09/13/2017  . Mixed hyperlipidemia 09/19/2017  . History of prostate cancer 02/21/2014  . S/P prostatectomy 03/18/2014  . Stress incontinence 04/10/2014  . Sinus tachycardia 09/13/2017  . 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 Former smoker, stopped smoking in distant past 04/20/2018  . Senile purpura ([place]) 10/31/2018  . AMS (altered mental status) 04/28/2020  . Gastroenteritis 04/28/2020  . Aortic atherosclerosis ([place]) 04/28/2020  . Acute metabolic encephalopathy   . Hypokalemia    Resolved Ambulatory Problems    Diagnosis Date Noted  . No Resolved Ambulatory Problems   Past Medical History:   Diagnosis Date  . Back pain, chronic   . Cancer ([place])   . Influenza   . Pneumonia   . Prostate cancer ([place]) 02/21/2014        Review of Systems See HPI.     Objective:   Physical Exam Vitals reviewed.  Constitutional:      Appearance: Normal appearance. He is obese.  Neck:     Vascular: No carotid bruit.  Cardiovascular:     Rate and Rhythm: Normal rate and regular rhythm.     Pulses: Normal pulses.  Pulmonary:     Effort: Pulmonary effort is normal.     Breath sounds: Normal breath sounds.  Neurological:     General: No focal deficit present.     Mental Status: He is alert and oriented to person, place, and time.  Psychiatric:        Mood and Affect: Mood normal.           Assessment & Plan:  [name] KitchenMarland KitchenXavyer was seen today for hospitalization follow-up and back pain.  Diagnoses and all orders for this visit:  Acute metabolic encephalopathy -     Cancel: BASIC METABOLIC PANEL WITH GFR -     BASIC METABOLIC PANEL WITH GFR  Hypokalemia -     Cancel: BASIC METABOLIC PANEL WITH GFR -     BASIC METABOLIC PANEL WITH GFR  Altered mental status, unspecified altered mental status type -     Cancel: BASIC METABOLIC PANEL WITH GFR -     BASIC METABOLIC  PANEL WITH GFR  Sinus tachycardia -     BASIC METABOLIC PANEL WITH GFR  Lumbar back pain with radiculopathy affecting right lower extremity -     cyclobenzaprine (FLEXERIL) 10 MG tablet; Take 1 tablet (10 mg total) by mouth 2 (two) times daily as needed for muscle spasms. -     traMADol (ULTRAM) 50 MG tablet; Take 1 tablet (50 mg total) by mouth every 12 (twelve) hours as needed for moderate pain or severe pain. -     meloxicam (MOBIC) 15 MG tablet; Take 1 tablet (15 mg total) by mouth daily. -     predniSONE (DELTASONE) 50 MG tablet; One tab PO daily for 5 days.   Patient not having any issues with mental status after discharge from hospital.  Will check BMP today to follow-up on electrolytes and renal function.   Discussed with patient suspected viral gastroenteritis that led to dehydration that led to transient mental status change.  Continue on statin and metoprolol.  Blood pressure controlled today.  Discussed ongoing chronic low back pain with right-sided lumbar radiculopathy.  Discussed need for surgery referral.  He has had numerous epidural injections with minimal benefit.  Patient declines today.  Burst of prednisone with as needed tramadol and Flexeril and daily meloxicam.  Discussed risk of daily NSAIDs with GI upset.  Take with food watch for any of those symptoms.  [name] KitchenMarland KitchenPDMP reviewed during this encounter. No concerns.  Pt aware tramadol is a controlled substance with abuse potential.

## 2020-08-22 ENCOUNTER — Ambulatory Visit (INDEPENDENT_AMBULATORY_CARE_PROVIDER_SITE_OTHER): Payer: 59 | Admitting: Family Medicine

## 2020-08-22 ENCOUNTER — Other Ambulatory Visit: Payer: Self-pay

## 2020-08-22 ENCOUNTER — Encounter: Payer: Self-pay | Admitting: Family Medicine

## 2020-08-22 VITALS — BP 137/83 | HR 104 | Temp 97.8°F | Resp 17

## 2020-08-22 DIAGNOSIS — S39012A Strain of muscle, fascia and tendon of lower back, initial encounter: Secondary | ICD-10-CM | POA: Diagnosis not present

## 2020-08-22 MED ORDER — MELOXICAM 15 MG PO TABS: 15.0000 mg | ORAL_TABLET | Freq: Every day | ORAL | 1 refills | Status: DC

## 2020-08-22 MED ORDER — PREDNISONE 50 MG PO TABS: ORAL_TABLET | ORAL | 0 refills | Status: DC

## 2020-08-22 MED ORDER — KETOROLAC TROMETHAMINE 60 MG/2ML IM SOLN: 60.0000 mg | Freq: Once | INTRAMUSCULAR | Status: DC

## 2020-08-22 MED ORDER — KETOROLAC TROMETHAMINE 60 MG/2ML IM SOLN
60.0000 mg | Freq: Once | INTRAMUSCULAR | Status: AC
Start: 2020-08-22 — End: 2020-08-22
  Administered 2020-08-22: 60 mg via INTRAMUSCULAR

## 2020-08-22 NOTE — Progress Notes (Signed)
Acute Office Visit  Subjective:    Patient ID: Harold Edwards, male    DOB: [birth year], 66 y.o.   MRN: [id number]  Chief Complaint  Patient presents with   Back Pain    HPI Patient is in today for left lower back.  He was riding on a boat on July 4th that was very bumpy and he has had left lower back pain ever since. He does have a hx of right back pain with sciatica, but states this is completely different/unrelated.   He is reporting 12/10 constant left mid-lower back pain that worsens with sitting, bending and improves while standing. 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 He has tried heat, creams, massage with no improvement. States he was on meloxicam and flexeril for his chronic back pain but wasn't finding them helpful any more so he hasn't been using consistently since this new pain. He has been trying  lower back stretches he has learned from PT and reports they are painful. He denies any radiation, numbness, tingling, rashes, incontinence.     Past Medical History:  Diagnosis Date   Back pain, chronic    Cancer ([place])    Chronic cough 09/19/2017   Influenza    Pneumonia    Prostate cancer ([place]) 02/21/2014   4+4=8 s/p TRUS biopsy by Dr. Elnoria Howard on 01/18/14  Last Assessment & Plan:   Patient be managed by urology.  Patient is status post prostatectomy and radiation therapy.  Denies hematuria.   Sinus tachycardia 09/13/2017   Stress incontinence 04/10/2014    Past Surgical History:  Procedure Laterality Date   ABDOMINAL SURGERY     PROSTATE SURGERY     Prostatectomy robot. Dr Tommi Rumps and Dr Redmond Pulling   TONSILLECTOMY      Family History  Problem Relation Age of Onset   Breast cancer Other    Diabetes Other    Cancer Mother    Diabetes Mother    Cancer Father    Diabetes Father     Social History   Socioeconomic History   Marital status: Married    Spouse name: Not on file   Number of children: Not on file   Years of  education: Not on file   Highest education level: Not on file  Occupational History   Not on file  Tobacco Use   Smoking status: Former    Packs/day: 1.00    Years: 13.00    Pack years: 13.00    Types: Cigarettes    Start date: 02/08/1974    Quit date: 10/02/1988    Years since quitting: 31.9   Smokeless tobacco: Never  Vaping Use   Vaping Use: Never used  Substance and Sexual Activity   Alcohol use: Yes    Alcohol/week: 3.0 standard drinks    Types: 3 Cans of beer per week   Drug use: Never   Sexual activity: Not Currently    Birth control/protection: None  Other Topics Concern   Not on file  Social History Narrative   Not on file   Social Determinants of Health   Financial Resource Strain: Not on file  Food Insecurity: Not on file  Transportation Needs: Not on file  Physical Activity: Not on file  Stress: Not on file  Social Connections: Not on file  Intimate Partner Violence: Not on file    Outpatient Medications Prior to Visit  Medication Sig Dispense Refill   atorvastatin (LIPITOR) 20 MG  tablet Take 1 tablet (20 mg total) by mouth daily. 30 tablet 1   cyclobenzaprine (FLEXERIL) 10 MG tablet Take 1 tablet (10 mg total) by mouth 2 (two) times daily as needed for muscle spasms. 20 tablet 2   metoprolol succinate (TOPROL-XL) 50 MG 24 hr tablet Take 1 tablet (50 mg total) by mouth daily. Take with or immediately following a meal. 30 tablet 2   traMADol (ULTRAM) 50 MG tablet Take 1 tablet (50 mg total) by mouth every 12 (twelve) hours as needed for moderate pain or severe pain. 60 tablet 0   meloxicam (MOBIC) 15 MG tablet Take 1 tablet (15 mg total) by mouth daily. 30 tablet 1   predniSONE (DELTASONE) 50 MG tablet One tab PO daily for 5 days. 5 tablet 0   No facility-administered medications prior to visit.    Allergies  Allergen Reactions   Amoxicillin Diarrhea    Stomach cramps    Review of Systems All review of systems negative except what is listed in the  HPI     Objective:    Physical Exam Vitals reviewed.  Constitutional:      Appearance: Normal appearance. He is normal weight.  Musculoskeletal:        General: Tenderness present.     Cervical back: Normal range of motion.     Comments: L lumbar muscular tenderness, pain with all ROM  Skin:    Findings: No bruising, erythema or rash.  Neurological:     General: No focal deficit present.     Mental Status: He is alert and oriented to person, place, and time. Mental status is at baseline.  Psychiatric:        Mood and Affect: Mood normal.        Behavior: Behavior normal.        Thought Content: Thought content normal.        Judgment: Judgment normal.    BP 137/83   Pulse (!) 104   Temp 97.8 F (36.6 C)   Resp 17   SpO2 99%  Wt Readings from Last 3 Encounters:  05/14/20 214 lb (97.1 kg)  04/28/20 216 lb 7.9 oz (98.2 kg)  03/03/20 210 lb 1.6 oz (95.3 kg)    Health Maintenance Due  Topic Date Due   COVID-19 Vaccine (1) Never done   Zoster Vaccines- Shingrix (1 of 2) Never done    There are no preventive care reminders to display for this patient.   Lab Results  Component Value Date   TSH 0.711 04/28/2020   Lab Results  Component Value Date   WBC 4.9 04/29/2020   HGB 14.0 04/29/2020   HCT 41.5 04/29/2020   MCV 86.1 04/29/2020   PLT 215 04/29/2020   Lab Results  Component Value Date   NA 139 04/29/2020   K 3.4 (L) 04/29/2020   CO2 23 04/29/2020   GLUCOSE 110 (H) 04/29/2020   BUN 15 04/29/2020   CREATININE 1.00 04/29/2020   BILITOT 1.1 04/28/2020   ALKPHOS 54 04/28/2020   AST 22 04/28/2020   ALT 25 04/28/2020   PROT 7.2 04/28/2020   ALBUMIN 4.1 04/28/2020   CALCIUM 8.7 (L) 04/29/2020   ANIONGAP 8 04/29/2020   Lab Results  Component Value Date   CHOL 184 04/28/2020   Lab Results  Component Value Date   HDL 53 04/28/2020   Lab Results  Component Value Date   LDLCALC 119 (H) 04/28/2020   Lab Results  Component Value Date  TRIG 60  04/28/2020   Lab Results  Component Value Date   CHOLHDL 3.5 04/28/2020   No results found for: HGBA1C     Assessment & Plan:   1. Strain of lumbar region, initial encounter Left lower back strain. Significant pain today. Giving toradol injection and prednisone prescription. Refilling meloxicam and encouraged daily use starting tomorrow. Continue previous script for Flexeril as needed for spasms. Continue heat, massage, stretches. He declined PT - home exercises printed. Patient aware of signs/symptoms requiring further/urgent evaluation.  - predniSONE (DELTASONE) 50 MG tablet; One tab PO daily for 5 days.  Dispense: 5 tablet; Refill: 0 - meloxicam (MOBIC) 15 MG tablet; Take 1 tablet (15 mg total) by mouth daily.  Dispense: 30 tablet; Refill: 1 - ketorolac (TORADOL) injection 60 mg  Follow-up in 4 weeks if not improving or sooner if needed.   Purcell Nails Olevia Bowens, DNP, FNP-C

## 2020-08-22 NOTE — Addendum Note (Signed)
Addended by: Dema Severin on: 08/22/2020 09:24 AM   Modules accepted: Orders

## 2020-08-22 NOTE — Patient Instructions (Addendum)
Toradol shot today Start taking meloxicam again tomorrow morning Prednisone for 5 days starting today Flexeril as needed for muscle spasms Home exercises/stretches

## 2020-10-28 IMAGING — DX DG LUMBAR SPINE COMPLETE 4+V
5 series · 5 of 5 positions shown · non-contrast
Comparison: MRI 08/27/2018.

CLINICAL DATA: Midthoracic pain.

EXAM:
LUMBAR SPINE - COMPLETE 4+ VIEW

[l-spine ap]
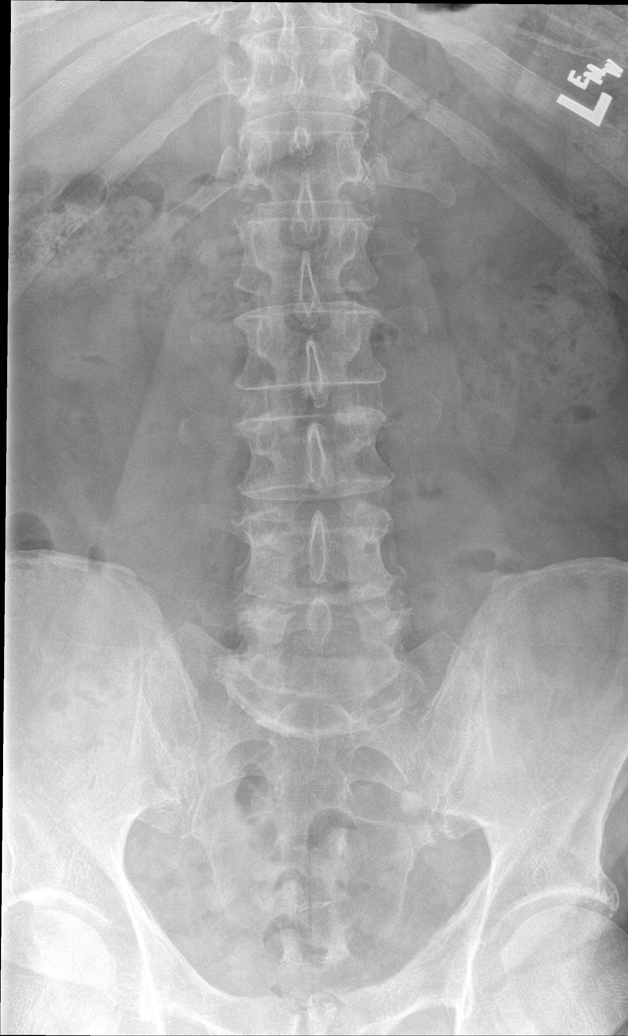

[l-spine obl (1 of 2)]
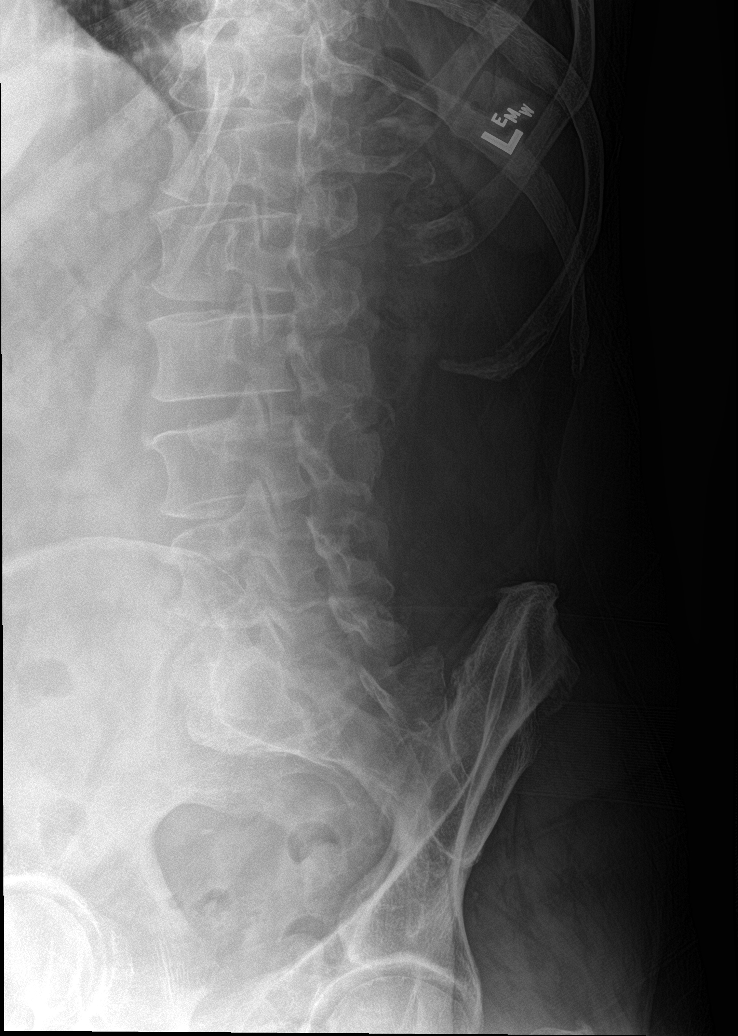

[l-spine obl (2 of 2)]
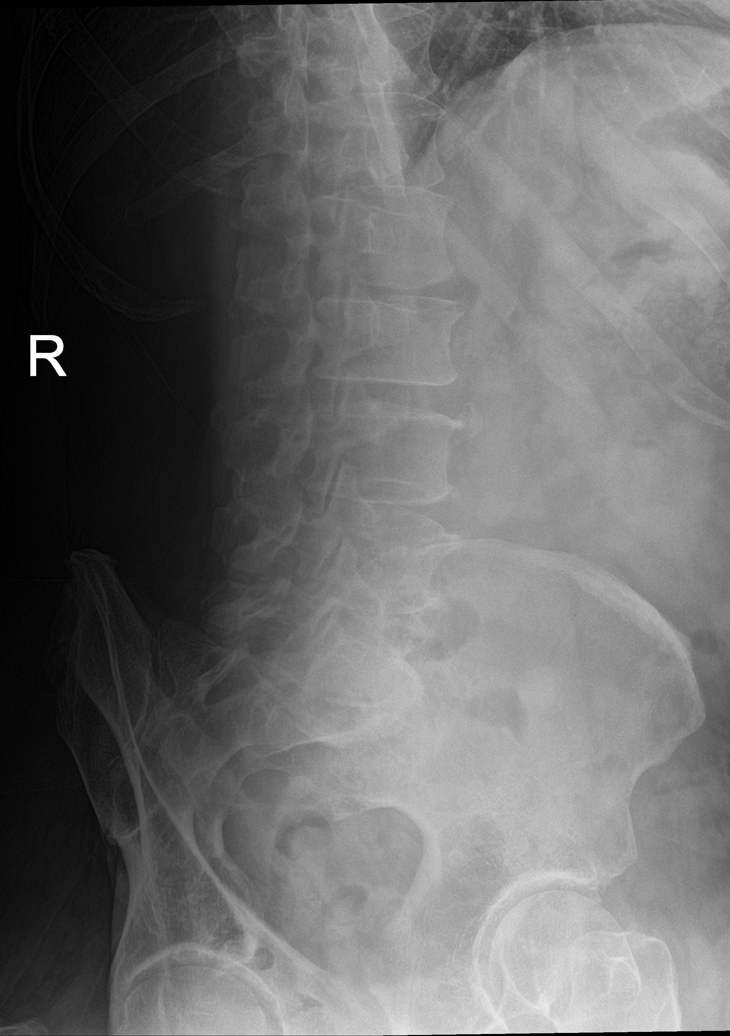

[l-spine lat]
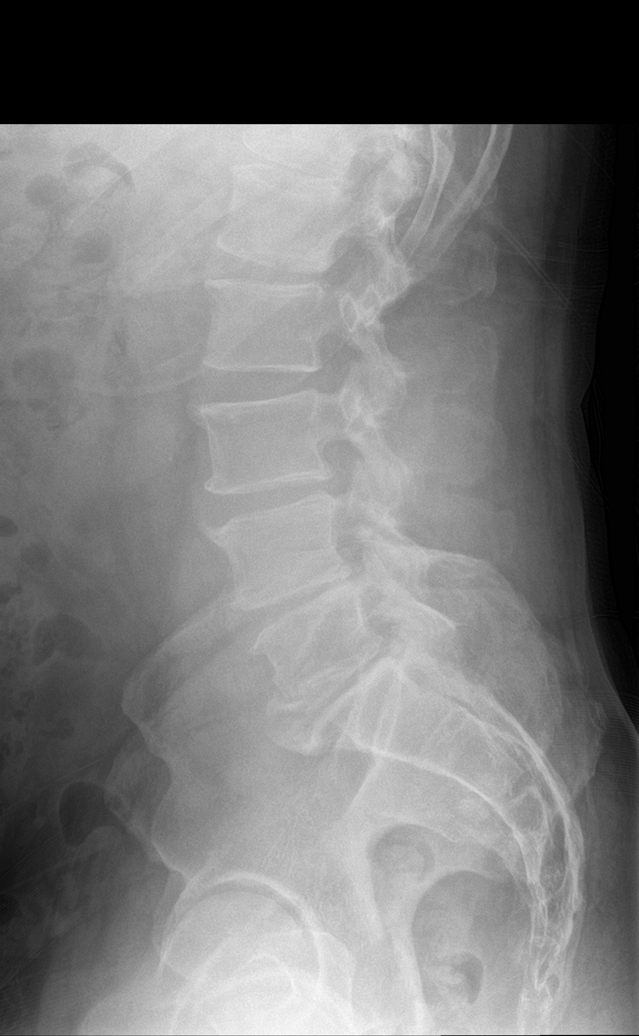

[l-spine spot]
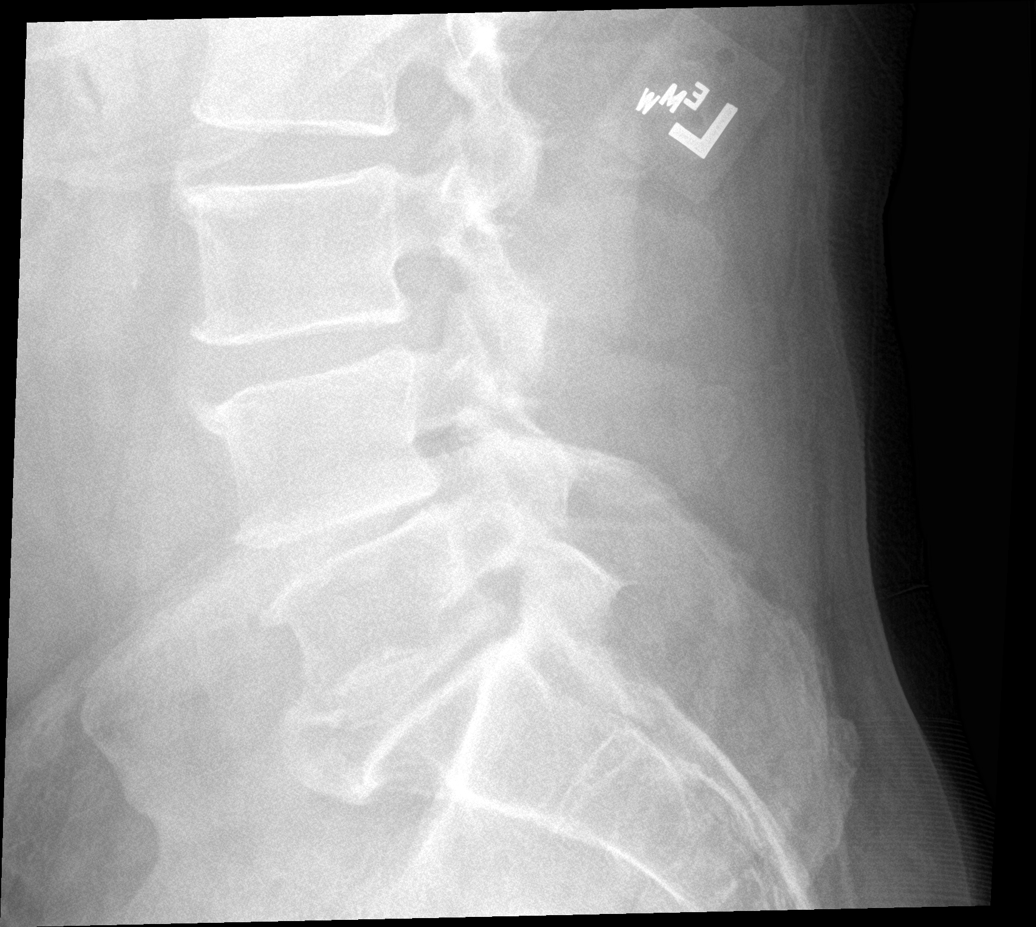

[5 of 5 positions shown; findings below may reference images not displayed]

FINDINGS: Paraspinal soft tissues are normal. Diffuse multilevel degenerative
change. No acute bony abnormality identified. No evidence of
fracture. Prominent amount of stool noted throughout the colon.
IMPRESSION: 1. Diffuse multilevel degenerative change. No acute abnormality
identified.

2.  Prominent amount of stool noted throughout the colon.

## 2020-11-12 ENCOUNTER — Ambulatory Visit (INDEPENDENT_AMBULATORY_CARE_PROVIDER_SITE_OTHER): Payer: 59 | Admitting: Physician Assistant

## 2020-11-12 ENCOUNTER — Other Ambulatory Visit: Payer: Self-pay

## 2020-11-12 VITALS — BP 130/90 | HR 102 | Ht 73.0 in | Wt 211.0 lb

## 2020-11-12 DIAGNOSIS — E782 Mixed hyperlipidemia: Secondary | ICD-10-CM

## 2020-11-12 DIAGNOSIS — D171 Benign lipomatous neoplasm of skin and subcutaneous tissue of trunk: Secondary | ICD-10-CM

## 2020-11-12 DIAGNOSIS — M5416 Radiculopathy, lumbar region: Secondary | ICD-10-CM

## 2020-11-12 DIAGNOSIS — Z8546 Personal history of malignant neoplasm of prostate: Secondary | ICD-10-CM

## 2020-11-12 DIAGNOSIS — Z79899 Other long term (current) drug therapy: Secondary | ICD-10-CM

## 2020-11-12 DIAGNOSIS — K582 Mixed irritable bowel syndrome: Secondary | ICD-10-CM

## 2020-11-12 DIAGNOSIS — K439 Ventral hernia without obstruction or gangrene: Secondary | ICD-10-CM

## 2020-11-12 NOTE — Patient Instructions (Addendum)
Start probiotic Culturelle.   Lipoma A lipoma is a noncancerous (benign) tumor that is made up of fat cells. This is a very common type of soft-tissue growth. Lipomas are usually found under the skin (subcutaneous). They may occur in any tissue of the body that contains fat. Common areas for lipomas to appear include the back, arms, shoulders, buttocks, and thighs. Lipomas grow slowly, and they are usually painless. Most lipomas do not cause problems and do not require treatment. What are the causes? The cause of this condition is not known. What increases the risk? You are more likely to develop this condition if: You are 6-13 years old. You have a family history of lipomas. What are the signs or symptoms? A lipoma usually appears as a small, round bump under the skin. In most cases, the lump will: Feel soft or rubbery. Not cause pain or other symptoms. However, if a lipoma is located in an area where it pushes on nerves, it can become painful or cause other symptoms. How is this diagnosed? A lipoma can usually be diagnosed with a physical exam. You may also have tests to confirm the diagnosis and to rule out other conditions. Tests may include: Imaging tests, such as a CT scan or an MRI. Removal of a tissue sample to be looked at under a microscope (biopsy). How is this treated? Treatment for this condition depends on the size of the lipoma and whether it is causing any symptoms. For small lipomas that are not causing problems, no treatment is needed. If a lipoma is bigger or it causes problems, surgery may be done to remove the lipoma. Lipomas can also be removed to improve appearance. Most often, the procedure is done after applying a medicine that numbs the area (local anesthetic). Liposuction may be done to reduce the size of the lipoma before it is removed through surgery, or it may be done to remove the lipoma. Lipomas are removed with this method in order to limit incision size and  scarring. A liposuction tube is inserted through a small incision into the lipoma, and the contents of the lipoma are removed through the tube with suction. Follow these instructions at home: Watch your lipoma for any changes. Keep all follow-up visits as told by your health care provider. This is important. Contact a health care provider if: Your lipoma becomes larger or hard. Your lipoma becomes painful, red, or increasingly swollen. These could be signs of infection or a more serious condition. Get help right away if: You develop tingling or numbness in an area near the lipoma. This could indicate that the lipoma is causing nerve damage. Summary A lipoma is a noncancerous tumor that is made up of fat cells. Most lipomas do not cause problems and do not require treatment. If a lipoma is bigger or it causes problems, surgery may be done to remove the lipoma. Contact a health care provider if your lipoma becomes larger or hard, or if it becomes painful, red, or increasingly swollen. Pain, redness, and swelling could be signs of infection or a more serious condition. This information is not intended to replace advice given to you by your health care provider. Make sure you discuss any questions you have with your health care provider. Document Revised: 09/11/2018 Document Reviewed: 09/11/2018 Elsevier Patient Education  2022 [place]. Ventral Hernia A ventral hernia is a bulge of tissue from inside the abdomen that pushes through a weak area of the muscles that form the front wall  of the abdomen. The tissues inside the abdomen are inside a sac (peritoneum). These tissues include the small intestine, large intestine, and the fatty tissue that covers the intestines (omentum). Sometimes, the bulge that forms a hernia contains intestines. Other hernias contain only fat. Ventral hernias do not go away without surgical treatment. There are several types of ventral hernias. You may have: A hernia at  an incision site from previous abdominal surgery (incisional hernia). A hernia just above the belly button (epigastric hernia), or at the belly button (umbilical hernia). These types of hernias can develop from heavy lifting or straining. A hernia that comes and goes (reducible hernia). It may be visible only when you lift or strain. This type of hernia can be pushed back into the abdomen (reduced). A hernia that traps abdominal tissue inside the hernia (incarcerated hernia). This type of hernia does not reduce. A hernia that cuts off blood flow to the tissues inside the hernia (strangulated hernia). The tissues can start to die if this happens. This is a very painful bulge that cannot be reduced. A strangulated hernia is a medical emergency. What are the causes? This condition is caused by abdominal tissue putting pressure on an area of weakness in the abdominal muscles. What increases the risk? The following factors may make you more likely to develop this condition: Being age 5 or older. Being overweight or obese. Having had previous abdominal surgery, especially if there was an infection after surgery. Having had an injury to the abdominal wall. Frequently lifting or pushing heavy objects. Having had several pregnancies. Having a buildup of fluid inside the abdomen (ascites). Straining to have a bowel movement or to urinate. Having frequent coughing episodes. What are the signs or symptoms? The only symptom of a ventral hernia may be a painless bulge in the abdomen. A reducible hernia may be visible only when you strain, cough, or lift. Other symptoms may include: Dull pain. A feeling of pressure. Signs and symptoms of a strangulated hernia may include: Increasing pain. Nausea and vomiting. Pain when pressing on the hernia. The skin over the hernia turning red or purple. Constipation. Blood in the stool (feces). How is this diagnosed? This condition may be diagnosed based on: Your  symptoms. Your medical history. A physical exam. You may be asked to cough or strain while standing. These actions increase the pressure inside your abdomen and force the hernia through the opening in your muscles. Your health care provider may try to reduce the hernia by gently pushing the hernia back in. Imaging studies, such as an ultrasound or CT scan. How is this treated? This condition is treated with surgery. If you have a strangulated hernia, surgery is done as soon as possible. If your hernia is small and not incarcerated, you may be asked to lose some weight before surgery. Follow these instructions at home: Follow instructions from your health care provider about eating or drinking restrictions. If you are overweight, your health care provider may recommend that you increase your activity level and eat a healthier diet. Do not lift anything that is heavier than 10 lb (4.5 kg), or the limit that you are told, until your health care provider says that it is safe. Return to your normal activities as told by your health care provider. Ask your health care provider what activities are safe for you. You may need to avoid activities that increase pressure on your hernia. Take over-the-counter and prescription medicines only as told by your health care  provider. Keep all follow-up visits. This is important. Contact a health care provider if: Your hernia gets larger. Your hernia becomes painful. Get help right away if: Your hernia becomes increasingly painful. You have pain along with any of the following: Changes in skin color in the area of the hernia. Nausea. Vomiting. Fever. These symptoms may represent a serious problem that is an emergency. Do not wait to see if the symptoms will go away. Get medical help right away. Call your local emergency services (911 in the [place] Do not drive yourself to the hospital. Summary A ventral hernia is a bulge of tissue from inside the abdomen that  pushes through a weak area of the muscles that form the front wall of the abdomen. This condition is treated with surgery, which may be urgent depending on your hernia. Do not lift anything that is heavier than 10 lb (4.5 kg), and follow activity instructions from your health care provider. This information is not intended to replace advice given to you by your health care provider. Make sure you discuss any questions you have with your health care provider. Document Revised: 09/14/2019 Document Reviewed: 09/14/2019 Elsevier Patient Education  2022 [place]. Irritable Bowel Syndrome, Adult Irritable bowel syndrome (IBS) is a group of symptoms that affects the organs responsible for digestion (gastrointestinal or GI tract). IBS is not one specific disease. To regulate how the GI tract works, the body sends signals back and forth between the intestines and the brain. If you have IBS, there may be a problem with these signals. As a result, the GI tract does not function normally. The intestines may become more sensitive and overreact to certain things. This may be especially true when you eat certain foods or when you are under stress. There are four types of IBS. These may be determined based on the consistency of your stool (feces): IBS with diarrhea. IBS with constipation. Mixed IBS. Unsubtyped IBS. It is important to know which type of IBS you have. Certain treatments are more likely to be helpful for certain types of IBS. What are the causes? The exact cause of IBS is not known. What increases the risk? You may have a higher risk for IBS if you: Are male. Are younger than 5. Have a family history of IBS. Have a mental health condition, such as depression, anxiety, or post-traumatic stress disorder. Have had a bacterial infection of your GI tract. What are the signs or symptoms? Symptoms of IBS vary from person to person. The main symptom is abdominal pain or discomfort. Other  symptoms usually include one or more of the following: Diarrhea, constipation, or both. Abdominal swelling or bloating. Feeling full after eating a small or regular-sized meal. Frequent gas. Mucus in the stool. A feeling of having more stool left after a bowel movement. Symptoms tend to come and go. They may be triggered by stress, mental health conditions, or certain foods. How is this diagnosed? This condition may be diagnosed based on a physical exam, your medical history, and your symptoms. You may have tests, such as: Blood tests. Stool test. X-rays. CT scan. Colonoscopy. This is a procedure in which your GI tract is viewed with a long, thin, flexible tube. How is this treated? There is no cure for IBS, but treatment can help relieve symptoms. Treatment depends on the type of IBS you have, and may include: Changes to your diet, such as: Avoiding foods that cause symptoms. Drinking more water. Following a low-FODMAP (fermentable oligosaccharides, disaccharides,  monosaccharides, and polyols) diet for up to 6 weeks, or as told by your health care provider. FODMAPs are sugars that are hard for some people to digest. Eating more fiber. Eating medium-sized meals at the same times every day. Medicines. These may include: Fiber supplements, if you have constipation. Medicine to control diarrhea (antidiarrheal medicines). Medicine to help control muscle tightening (spasms) in your GI tract (antispasmodic medicines). Medicines to help with mental health conditions, such as antidepressants or tranquilizers. Talk therapy or counseling. Working with a diet and nutrition specialist (dietitian) to help create a food plan that is right for you. Managing your stress. Follow these instructions at home: Eating and drinking Eat a healthy diet. Eat medium-sized meals at about the same time every day. Do not eat large meals. Gradually eat more fiber-rich foods. These include whole grains, fruits,  and vegetables. This may be especially helpful if you have IBS with constipation. Eat a diet low in FODMAPs. Drink enough fluid to keep your urine pale yellow. Keep a journal of foods that seem to trigger symptoms. Avoid foods and drinks that: Contain added sugar. Make your symptoms worse. Dairy products, caffeinated drinks, and carbonated drinks can make symptoms worse for some people. General instructions Take over-the-counter and prescription medicines and supplements only as told by your health care provider. Get enough exercise. Do at least 150 minutes of moderate-intensity exercise each week. Manage your stress. Getting enough sleep and exercise can help you manage stress. Keep all follow-up visits as told by your health care provider and therapist. This is important. Alcohol Use Do not drink alcohol if: Your health care provider tells you not to drink. You are pregnant, may be pregnant, or are planning to become pregnant. If you drink alcohol, limit how much you have: 0-1 drink a day for women. 0-2 drinks a day for men. Be aware of how much alcohol is in your drink. In the [place] one drink equals one typical bottle of beer (12 oz), one-half glass of wine (5 oz), or one shot of hard liquor (1 oz). Contact a health care provider if you have: Constant pain. Weight loss. Difficulty or pain when swallowing. Diarrhea that gets worse. Get help right away if you have: Severe abdominal pain. Fever. Diarrhea with symptoms of dehydration, such as dizziness or dry mouth. Bright red blood in your stool. Stool that is black and tarry. Abdominal swelling. Vomiting that does not stop. Blood in your vomit. Summary Irritable bowel syndrome (IBS) is not one specific disease. It is a group of symptoms that affects digestion. Your intestines may become more sensitive and overreact to certain things. This may be especially true when you eat certain foods or when you are under stress. There is  no cure for IBS, but treatment can help relieve symptoms. This information is not intended to replace advice given to you by your health care provider. Make sure you discuss any questions you have with your health care provider. Document Revised: 09/27/2019 Document Reviewed: 09/27/2019 Elsevier Patient Education  2022 [place] for Irritable Bowel Syndrome When you have irritable bowel syndrome (IBS), it is very important to eat the foods and follow the eating habits that are best for your condition. IBS may cause various symptoms such as pain in the abdomen, constipation, or diarrhea. Choosing the right foods can help to ease the discomfort from these symptoms. Work with your health care provider and diet and nutrition specialist (dietitian) to find the eating plan that will help to  control your symptoms. What are tips for following this plan?   Keep a food diary. This will help you identify foods that cause symptoms. Write down: What you eat and when you eat it. What symptoms you have. When symptoms occur in relation to your meals, such as "pain in abdomen 2 hours after dinner." Eat your meals slowly and in a relaxed setting. Aim to eat 5-6 small meals per day. Do not skip meals. Drink enough fluid to keep your urine pale yellow. Ask your health care provider if you should take an over-the-counter probiotic to help restore healthy bacteria in your gut (digestive tract). Probiotics are foods that contain good bacteria and yeasts. Your dietitian may have specific dietary recommendations for you based on your symptoms. He or she may recommend that you: Avoid foods that cause symptoms. Talk with your dietitian about other ways to get the same nutrients that are in those problem foods. Avoid foods with gluten. Gluten is a protein that is found in rye, wheat, and barley. Eat more foods that contain soluble fiber. Examples of foods with high soluble fiber include oats, seeds, and certain  fruits and vegetables. Take a fiber supplement if directed by your dietitian. Reduce or avoid certain foods called FODMAPs. These are foods that contain carbohydrates that are hard to digest. Ask your doctor which foods contain these carbohydrates. What foods are not recommended? The following are some foods and drinks that may make your symptoms worse: Fatty foods, such as french fries. Foods that contain gluten, such as pasta and cereal. Dairy products, such as milk, cheese, and ice cream. Chocolate. Alcohol. Products with caffeine, such as coffee. Carbonated drinks, such as soda. Foods that are high in FODMAPs. These include certain fruits and vegetables. Products with sweeteners such as honey, high fructose corn syrup, sorbitol, and mannitol. The items listed above may not be a complete list of foods and beverages you should avoid. Contact a dietitian for more information. What foods are good sources of fiber? Your health care provider or dietitian may recommend that you eat more foods that contain fiber. Fiber can help to reduce constipation and other IBS symptoms. Add foods with fiber to your diet a little at a time so your body can get used to them. Too much fiber at one time might cause gas and swelling of your abdomen. The following are some foods that are good sources of fiber: Berries, such as raspberries, strawberries, and blueberries. Tomatoes. Carrots. Brown rice. Oats. Seeds, such as chia and pumpkin seeds. The items listed above may not be a complete list of recommended sources of fiber. Contact your dietitian for more options. Where to find more information International [employer] for Functional Gastrointestinal Disorders: www.iffgd.[employer] Diabetes and Digestive and Kidney Diseases: [employer] Summary When you have irritable bowel syndrome (IBS), it is very important to eat the foods and follow the eating habits that are best for your  condition. IBS may cause various symptoms such as pain in the abdomen, constipation, or diarrhea. Choosing the right foods can help to ease the discomfort that comes from symptoms. Keep a food diary. This will help you identify foods that cause symptoms. Your health care provider or diet and nutrition specialist (dietitian) may recommend that you eat more foods that contain fiber. This information is not intended to replace advice given to you by your health care provider. Make sure you discuss any questions you have with your health care provider. Document Revised: 09/27/2019 Document Reviewed:  09/27/2019 Elsevier Patient Education  2022 [employer].

## 2020-11-12 NOTE — Progress Notes (Signed)
Subjective:    Patient ID: Harold Edwards, male    DOB: 11/07/1955, 65 y.o.   MRN: [id number]  HPI Pt is a 65 yo obese male with chronic low back pain, HLD who presents to the clinic for follow up.   He is having a lot of GI symptoms of mixed diarrhea and constipation. Denies any significant abdominal pain, melena, hematochezia. No fever or chills. Pt has not had colonoscopy and does not want one. He did have episode of gastroenteritis so severe he developed encephalopathy earlier this year.  He does have a painless mass of left upper chest for months and seems like it is getting a little bigger.   No CP, palpitations, headaches or vision changes.   .. Active Ambulatory Problems    Diagnosis Date Noted   Chronic cough 09/19/2017   ED (erectile dysfunction) 04/10/2014   Medication side effects 09/13/2017   Mixed hyperlipidemia 09/19/2017   History of prostate cancer 02/21/2014   S/P prostatectomy 03/18/2014   Stress incontinence 04/10/2014   Sinus tachycardia 09/13/2017   Lumbar back pain with radiculopathy affecting right lower extremity 04/14/2018   Former smoker, stopped smoking in distant past 04/20/2018   Senile purpura ([place]) 10/31/2018   AMS (altered mental status) 04/28/2020   Gastroenteritis 04/28/2020   Aortic atherosclerosis ([place]) 25/36/6440   Acute metabolic encephalopathy    Hypokalemia    Ventral hernia without obstruction or gangrene 11/16/2020   Lipoma of torso 11/16/2020   Irritable bowel syndrome with both constipation and diarrhea 11/16/2020   Resolved Ambulatory Problems    Diagnosis Date Noted   No Resolved Ambulatory Problems   Past Medical History:  Diagnosis Date   Back pain, chronic    Cancer ([place])    Influenza    Pneumonia    Prostate cancer ([place]) 02/21/2014      Review of Systems See HPI.     Objective:   Physical Exam Vitals reviewed.  Constitutional:      Appearance: Normal appearance.  HENT:     Head: Normocephalic.   Cardiovascular:     Rate and Rhythm: Normal rate and regular rhythm.     Pulses: Normal pulses.     Heart sounds: Normal heart sounds.  Pulmonary:     Effort: Pulmonary effort is normal.     Breath sounds: Normal breath sounds.  Abdominal:     General: Bowel sounds are normal. There is no distension.     Palpations: Abdomen is soft. There is no mass.     Tenderness: There is no abdominal tenderness. There is no right CVA tenderness, left CVA tenderness or guarding.     Hernia: A hernia is present.     Comments: Ventral hernia  Musculoskeletal:     Right lower leg: No edema.     Left lower leg: No edema.  Skin:    Comments: 2cm by 2cm non tender mobile firm mass of upper left chest wall just under clavicle.   Neurological:     General: No focal deficit present.     Mental Status: He is alert and oriented to person, place, and time.  Psychiatric:        Mood and Affect: Mood normal.          Assessment & Plan:  [name] KitchenMarland KitchenMakhai was seen today for follow-up.  Diagnoses and all orders for this visit:  Irritable bowel syndrome with both constipation and diarrhea  Medication management -     COMPLETE METABOLIC PANEL WITH GFR  Ventral hernia without obstruction or gangrene  Lipoma of torso  Lumbar back pain with radiculopathy affecting right lower extremity  Mixed hyperlipidemia -     atorvastatin (LIPITOR) 20 MG tablet; Take 1 tablet (20 mg total) by mouth daily.   No red flag GI symptoms and sounds like IBS.  He declines colonoscopy. He is aware of this risk.  Discussed probiotic to help with GI symptoms.  Consider changing up diet.  Gave HO for IBS.   UTD labs.   Reassured about lipoma. If would like removed or becoming larger we can make referral to Dr. Darene Lamer.   Discussed ventral hernia. No red flags and does not appear to be strangulated.  HO given.  Declined surgery referral at this time.

## 2020-11-16 ENCOUNTER — Encounter: Payer: Self-pay | Admitting: Physician Assistant

## 2020-11-16 DIAGNOSIS — K582 Mixed irritable bowel syndrome: Secondary | ICD-10-CM | POA: Insufficient documentation

## 2020-11-16 DIAGNOSIS — D171 Benign lipomatous neoplasm of skin and subcutaneous tissue of trunk: Secondary | ICD-10-CM | POA: Insufficient documentation

## 2020-11-16 DIAGNOSIS — K439 Ventral hernia without obstruction or gangrene: Secondary | ICD-10-CM | POA: Insufficient documentation

## 2020-11-16 MED ORDER — ATORVASTATIN CALCIUM 20 MG PO TABS: 20.0000 mg | ORAL_TABLET | Freq: Every day | ORAL | 3 refills | Status: DC

## 2020-11-17 MED ORDER — ATORVASTATIN CALCIUM 20 MG PO TABS: 20.0000 mg | ORAL_TABLET | Freq: Every day | ORAL | 3 refills | Status: DC

## 2020-11-17 NOTE — Addendum Note (Signed)
Addended byAnnamaria Helling on: 11/17/2020 08:16 AM   Modules accepted: Orders

## 2021-03-07 ENCOUNTER — Emergency Department (INDEPENDENT_AMBULATORY_CARE_PROVIDER_SITE_OTHER)
Admission: EM | Admit: 2021-03-07 | Discharge: 2021-03-07 | Disposition: A | Discharge status: Home / Self Care | Payer: 59 | Source: Home / Self Care

## 2021-03-07 ENCOUNTER — Other Ambulatory Visit: Payer: Self-pay

## 2021-03-07 DIAGNOSIS — J0100 Acute maxillary sinusitis, unspecified: Secondary | ICD-10-CM

## 2021-03-07 DIAGNOSIS — R059 Cough, unspecified: Secondary | ICD-10-CM

## 2021-03-07 MED ORDER — DOXYCYCLINE HYCLATE 100 MG PO CAPS: 100.0000 mg | ORAL_CAPSULE | Freq: Two times a day (BID) | ORAL | 0 refills | Status: AC

## 2021-03-07 MED ORDER — PREDNISONE 20 MG PO TABS: ORAL_TABLET | ORAL | 0 refills | Status: DC

## 2021-03-07 MED ORDER — BENZONATATE 200 MG PO CAPS: 200.0000 mg | ORAL_CAPSULE | Freq: Three times a day (TID) | ORAL | 0 refills | Status: AC | PRN

## 2021-03-07 MED ORDER — PROMETHAZINE-DM 6.25-15 MG/5ML PO SYRP: 5.0000 mL | ORAL_SOLUTION | Freq: Two times a day (BID) | ORAL | 0 refills | Status: DC | PRN

## 2021-03-07 NOTE — ED Provider Notes (Signed)
[name]    CSN: [id number] Arrival date & time: 03/07/21  0901      History   Chief Complaint Chief Complaint  Patient presents with   Cough    Cough, head congestion, nasal congestion and sore throat x5 days    HPI Harold Edwards is a 65 y.o. male.   HPI 66 year old male presents with cough, chest congestion, nasal congestion and sore throat for 5 days.  Patient reports is vaccinated for COVID-19 and influenza.  PMH significant for chronic cough and prostate cancer.  Past Medical History:  Diagnosis Date   Back pain, chronic    Cancer ([place])    Chronic cough 09/19/2017   Influenza    Pneumonia    Prostate cancer ([place]) 02/21/2014   4+4=8 s/p TRUS biopsy by Dr. Elnoria Howard on 01/18/14  Last Assessment & Plan:   Patient be managed by urology.  Patient is status post prostatectomy and radiation therapy.  Denies hematuria.   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   Acute metabolic encephalopathy    Hypokalemia    AMS (altered mental status) 04/28/2020   Gastroenteritis 04/28/2020   Aortic atherosclerosis ([place]) 04/28/2020   Senile purpura ([place]) 10/31/2018   Former smoker, stopped smoking in distant past 04/20/2018   Lumbar back pain with radiculopathy affecting right lower extremity 04/14/2018   Chronic cough 09/19/2017   Mixed hyperlipidemia 09/19/2017   Medication side effects 09/13/2017   Sinus tachycardia 09/13/2017   ED (erectile dysfunction) 04/10/2014   Stress incontinence 04/10/2014   S/P prostatectomy 03/18/2014   History of prostate cancer 02/21/2014    Past Surgical History:  Procedure Laterality Date   ABDOMINAL SURGERY     PROSTATE SURGERY     Prostatectomy robot. Dr Tommi Rumps and Dr Redmond Pulling   TONSILLECTOMY          Home Medications    Prior to Admission medications   Medication Sig Start Date End Date Taking? Authorizing Provider  atorvastatin (LIPITOR) 20 MG tablet Take 1 tablet (20 mg total) by mouth daily. 11/17/20 11/17/21 Yes Breeback, Jade L, PA-C  benzonatate (TESSALON) 200 MG capsule Take 1 capsule (200 mg total) by mouth 3 (three) times daily as needed for up to 7 days for cough. 03/07/21 03/14/21 Yes Eliezer Lofts, FNP  cyclobenzaprine (FLEXERIL) 10 MG tablet Take 1 tablet (10 mg total) by mouth 2 (two) times daily as needed for muscle spasms. 05/14/20  Yes Breeback, Jade L, PA-C  doxycycline (VIBRAMYCIN) 100 MG capsule Take 1 capsule (100 mg total) by mouth 2 (two) times daily for 10 days. 03/07/21 03/17/21 Yes Eliezer Lofts, FNP  meloxicam (MOBIC) 15 MG tablet Take 1 tablet (15 mg total) by mouth daily. 08/22/20  Yes Terrilyn Saver, NP  metoprolol succinate (TOPROL-XL) 50 MG 24 hr tablet Take 1 tablet (50 mg total) by mouth daily. Take with or immediately following a meal. 04/29/20  Yes Eugenie Filler, MD  predniSONE (DELTASONE) 20 MG tablet Take 3 tabs PO daily x 5 days. 03/07/21  Yes Eliezer Lofts, FNP  promethazine-dextromethorphan (PROMETHAZINE-DM) 6.25-15 MG/5ML syrup Take 5 mLs by mouth 2 (two) times daily as needed for cough. 03/07/21  Yes Eliezer Lofts, FNP  traMADol (ULTRAM) 50 MG tablet Take 1 tablet (50 mg total) by  mouth every 12 (twelve) hours as needed for moderate pain or severe pain. 05/14/20  Yes Donella Stade, PA-C    Family History Family History  Problem Relation Age of Onset   Breast cancer Other    Diabetes Other    Cancer Mother    Diabetes Mother    Cancer Father    Diabetes Father     Social History Social History   Tobacco Use   Smoking status: Former    Packs/day: 1.00    Years: 13.00    Pack years: 13.00    Types: Cigarettes    Start date: 02/08/1974    Quit date: 10/02/1988    Years since quitting: 32.4   Smokeless tobacco: Never  Vaping Use    Vaping Use: Never used  Substance Use Topics   Alcohol use: Not Currently    Alcohol/week: 3.0 standard drinks    Types: 3 Cans of beer per week   Drug use: Never     Allergies   Amoxicillin   Review of Systems Review of Systems  HENT:  Positive for congestion and sore throat.   Respiratory:  Positive for cough.   All other systems reviewed and are negative.   Physical Exam Triage Vital Signs ED Triage Vitals  Enc Vitals Group     BP 03/07/21 0923 140/88     Pulse Rate 03/07/21 0923 (!) 110     Resp 03/07/21 0923 18     Temp 03/07/21 0923 98.2 F (36.8 C)     Temp Source 03/07/21 0923 Oral     SpO2 03/07/21 0923 97 %     Weight 03/07/21 0921 205 lb (93 kg)     Height 03/07/21 0921 6\' 1"  (1.854 m)     Head Circumference --      Peak Flow --      Pain Score 03/07/21 0921 5     Pain Loc --      Pain Edu? --      Excl. in [place]? --    No data found.  Updated Vital Signs BP 140/88 (BP Location: Left Arm)    Pulse (!) 110    Temp 98.2 F (36.8 C) (Oral)    Resp 18    Ht 6\' 1"  (1.854 m)    Wt 205 lb (93 kg)    SpO2 97%    BMI 27.05 kg/m       Physical Exam Vitals and nursing note reviewed.  Constitutional:      General: He is not in acute distress.    Appearance: Normal appearance. He is obese.  HENT:     Head: Normocephalic and atraumatic.     Right Ear: Tympanic membrane, ear canal and external ear normal.     Left Ear: Tympanic membrane, ear canal and external ear normal.     Mouth/Throat:     Mouth: Mucous membranes are moist.     Pharynx: Oropharynx is clear.  Eyes:     Extraocular Movements: Extraocular movements intact.     Conjunctiva/sclera: Conjunctivae normal.     Pupils: Pupils are equal, round, and reactive to light.  Cardiovascular:     Rate and Rhythm: Normal rate and regular rhythm.     Pulses: Normal pulses.     Heart sounds: Normal heart sounds.  Pulmonary:     Effort: Pulmonary effort is normal.     Breath sounds: Normal breath  sounds.     Comments: Infrequent nonproductive cough noted on exam  Musculoskeletal:     Cervical back: Normal range of motion and neck supple.  Skin:    General: Skin is warm and dry.  Neurological:     General: No focal deficit present.     Mental Status: He is alert and oriented to person, place, and time.     UC Treatments / Results  Labs (all labs ordered are listed, but only abnormal results are displayed) Labs Reviewed - No data to display  EKG   Radiology No results found.  Procedures Procedures (including critical care time)  Medications Ordered in UC Medications - No data to display  Initial Impression / Assessment and Plan / UC Course  I have reviewed the triage vital signs and the nursing notes.  Pertinent labs & imaging results that were available during my care of the patient were reviewed by me and considered in my medical decision making (see chart for details).    MDM: 1.  Subacute maxillary sinusitis-doxycycline; 2.  Cough-Rx'd Prednisone, Tessalon Perles, and Promethazine DM. Advised/instructed patient to take medication as directed with food to completion.  Advised patient to take Prednisone with first dose of Doxycycline for the next 5 of 10 days.  Advised patient may use Tessalon Perles daily or as needed for cough.  Advised patient may use Promethazine DM prior to sleep or if cough is disrupting sleep at night due to sedate of effects.  Advised patient not to take Tessalon Perles and promethazine DM together.  Encouraged patient to increase daily water intake while taking these medications.  Patient discharged home, hemodynamically stable. Final Clinical Impressions(s) / UC Diagnoses   Final diagnoses:  Subacute maxillary sinusitis  Cough, unspecified type     Discharge Instructions      Advised/instructed patient to take medication as directed with food to completion.  Advised patient to take Prednisone with first dose of Doxycycline for the next  5 of 10 days.  Advised patient may use Tessalon Perles daily or as needed for cough.  Advised patient may use Promethazine DM prior to sleep or if cough is disrupting sleep at night due to sedate of effects.  Advised patient not to take Tessalon Perles and promethazine DM together.  Encouraged patient to increase daily water intake while taking these medications.     ED Prescriptions     Medication Sig Dispense Auth. Provider   doxycycline (VIBRAMYCIN) 100 MG capsule Take 1 capsule (100 mg total) by mouth 2 (two) times daily for 10 days. 20 capsule Eliezer Lofts, FNP   predniSONE (DELTASONE) 20 MG tablet Take 3 tabs PO daily x 5 days. 15 tablet Eliezer Lofts, FNP   benzonatate (TESSALON) 200 MG capsule Take 1 capsule (200 mg total) by mouth 3 (three) times daily as needed for up to 7 days for cough. 50 capsule Eliezer Lofts, FNP   promethazine-dextromethorphan (PROMETHAZINE-DM) 6.25-15 MG/5ML syrup Take 5 mLs by mouth 2 (two) times daily as needed for cough. 118 mL Eliezer Lofts, FNP      PDMP not reviewed this encounter.   Eliezer Lofts, [place] 03/07/21 [phone]

## 2021-03-07 NOTE — Discharge Instructions (Addendum)
Advised/instructed patient to take medication as directed with food to completion.  Advised patient to take Prednisone with first dose of Doxycycline for the next 5 of 10 days.  Advised patient may use Tessalon Perles daily or as needed for cough.  Advised patient may use Promethazine DM prior to sleep or if cough is disrupting sleep at night due to sedate of effects.  Advised patient not to take Tessalon Perles and promethazine DM together.  Encouraged patient to increase daily water intake while taking these medications.

## 2021-03-07 NOTE — ED Triage Notes (Signed)
Pt states that he has a cough, chest congestion, nasal congestion and sore throat. X5 days   Pt states that he is vaccinated for covid Pt states that he has had flu vaccine.

## 2021-05-23 ENCOUNTER — Emergency Department (INDEPENDENT_AMBULATORY_CARE_PROVIDER_SITE_OTHER)
Admission: EM | Admit: 2021-05-23 | Discharge: 2021-05-23 | Disposition: A | Discharge status: Home / Self Care | Payer: 59 | Source: Home / Self Care | Attending: Family Medicine | Admitting: Family Medicine

## 2021-05-23 DIAGNOSIS — R051 Acute cough: Secondary | ICD-10-CM | POA: Diagnosis not present

## 2021-05-23 DIAGNOSIS — H9201 Otalgia, right ear: Secondary | ICD-10-CM

## 2021-05-23 DIAGNOSIS — B9689 Other specified bacterial agents as the cause of diseases classified elsewhere: Secondary | ICD-10-CM

## 2021-05-23 MED ORDER — PREDNISONE 20 MG PO TABS: ORAL_TABLET | ORAL | 0 refills | Status: DC

## 2021-05-23 MED ORDER — CEPHALEXIN 500 MG PO CAPS: 500.0000 mg | ORAL_CAPSULE | Freq: Two times a day (BID) | ORAL | 0 refills | Status: DC

## 2021-05-23 NOTE — ED Triage Notes (Signed)
Pt presents to Urgent Care with c/o productive cough, nasal congestion w/ sinus pressure, and R ear fullness x 1 week. Negative home COVID test today.  ?

## 2021-05-23 NOTE — Discharge Instructions (Signed)
May take mucinex DM for cough ?May take coricidin HBP for runny nose and sinus ?Take the antibiotic 2 x a day for 7-10 days ?Take the prednisone for 5 days ?Drink lots of water ?

## 2021-05-23 NOTE — ED Provider Notes (Signed)
?[facility] ? ? ? ?CSN: [id number] ?Arrival date & time: 05/23/21  0800 ? ? ?  ? ?History   ?Chief Complaint ?Chief Complaint  ?Patient presents with  ? Cough  ? Ear Fullness  ? sinus pressure  ? ? ?HPI ?Harold Edwards is a 66 y.o. male.  ? ?HPI ? ?History reviewed.  Prostate cancer treatment 2015.  Now with cough and green sputum , sinus pressure and pain, R ear pressure and pain for a week.  Not responding to OTC medicine. ?COVID test neg ? ?Past Medical History:  ?Diagnosis Date  ? Back pain, chronic   ? Cancer [facility])   ? Chronic cough 09/19/2017  ? Influenza   ? Pneumonia   ? Prostate cancer ([place]) 02/21/2014  ? 4+4=8 s/p TRUS biopsy by Dr. Elnoria Howard on 01/18/14  Last Assessment & Plan:   Patient be managed by urology.  Patient is status post prostatectomy and radiation therapy.  Denies hematuria.  ? Sinus tachycardia 09/13/2017  ? Stress incontinence 04/10/2014  ? ? ?Patient Active Problem List  ? Diagnosis Date Noted  ? Ventral hernia without obstruction or gangrene 11/16/2020  ? Lipoma of torso 11/16/2020  ? Irritable bowel syndrome with both constipation and diarrhea 11/16/2020  ? Acute metabolic encephalopathy   ? Hypokalemia   ? AMS (altered mental status) 04/28/2020  ? Gastroenteritis 04/28/2020  ? Aortic atherosclerosis ([place]) 04/28/2020  ? Senile purpura ([place]) 10/31/2018  ? Former smoker, stopped smoking in distant past 04/20/2018  ? Lumbar back pain with radiculopathy affecting right lower extremity 04/14/2018  ? Chronic cough 09/19/2017  ? Mixed hyperlipidemia 09/19/2017  ? Medication side effects 09/13/2017  ? Sinus tachycardia 09/13/2017  ? ED (erectile dysfunction) 04/10/2014  ? Stress incontinence 04/10/2014  ? S/P prostatectomy 03/18/2014  ? History of prostate cancer 02/21/2014  ? ? ?Past Surgical History:  ?Procedure Laterality Date  ? ABDOMINAL SURGERY    ? PROSTATE SURGERY    ? Prostatectomy robot. Dr Tommi Rumps and Dr Redmond Pulling  ? TONSILLECTOMY    ? ? ? ? ? ?Home Medications   ? ?Prior to Admission  medications   ?Medication Sig Start Date End Date Taking? Authorizing Provider  ?cephALEXin (KEFLEX) 500 MG capsule Take 1 capsule (500 mg total) by mouth 2 (two) times daily. 05/23/21  Yes Raylene Everts, MD  ?atorvastatin (LIPITOR) 20 MG tablet Take 1 tablet (20 mg total) by mouth daily. 11/17/20 11/17/21  Donella Stade, PA-C  ?cyclobenzaprine (FLEXERIL) 10 MG tablet Take 1 tablet (10 mg total) by mouth 2 (two) times daily as needed for muscle spasms. 05/14/20   Donella Stade, PA-C  ?meloxicam (MOBIC) 15 MG tablet Take 1 tablet (15 mg total) by mouth daily. 08/22/20   Terrilyn Saver, NP  ?metoprolol succinate (TOPROL-XL) 50 MG 24 hr tablet Take 1 tablet (50 mg total) by mouth daily. Take with or immediately following a meal. 04/29/20   Eugenie Filler, MD  ?predniSONE (DELTASONE) 20 MG tablet Take 2 tabs PO daily x 5 days. 05/23/21   Raylene Everts, MD  ?promethazine-dextromethorphan (PROMETHAZINE-DM) 6.25-15 MG/5ML syrup Take 5 mLs by mouth 2 (two) times daily as needed for cough. 03/07/21   Eliezer Lofts, FNP  ?traMADol (ULTRAM) 50 MG tablet Take 1 tablet (50 mg total) by mouth every 12 (twelve) hours as needed for moderate pain or severe pain. 05/14/20   Donella Stade, PA-C  ? ? ?Family History ?Family History  ?Problem Relation Age of Onset  ?  Breast cancer Other   ? Diabetes Other   ? Cancer Mother   ? Diabetes Mother   ? Cancer Father   ? Diabetes Father   ? ? ?Social History ?Social History  ? ?Tobacco Use  ? Smoking status: Former  ?  Packs/day: 1.00  ?  Years: 13.00  ?  Pack years: 13.00  ?  Types: Cigarettes  ?  Start date: 02/08/1974  ?  Quit date: 10/02/1988  ?  Years since quitting: 32.6  ? Smokeless tobacco: Never  ?Vaping Use  ? Vaping Use: Never used  ?Substance Use Topics  ? Alcohol use: Yes  ?  Comment: rarely  ? Drug use: Never  ? ? ? ?Allergies   ?Amoxicillin ? ? ?Review of Systems ?Review of Systems ?See HPI ? ?Physical Exam ?Triage Vital Signs ?ED Triage Vitals  ?Enc Vitals Group  ?    BP 05/23/21 0824 124/80  ?   Pulse Rate 05/23/21 0824 (!) 107  ?   Resp 05/23/21 0824 20  ?   Temp 05/23/21 0824 97.9 ?F (36.6 ?C)  ?   Temp Source 05/23/21 0824 Oral  ?   SpO2 05/23/21 0824 96 %  ?   Weight 05/23/21 0820 210 lb (95.3 kg)  ?   Height 05/23/21 0820 '6\' 1"'$  (1.854 m)  ?   Head Circumference --   ?   Peak Flow --   ?   Pain Score 05/23/21 0819 0  ?   Pain Loc --   ?   Pain Edu? --   ?   Excl. in [place]? --   ? ?No data found. ? ?Updated Vital Signs ?BP 124/80 (BP Location: Right Arm)   Pulse (!) 107   Temp 97.9 ?F (36.6 ?C) (Oral)   Resp 20   Ht '6\' 1"'$  (1.854 m)   Wt 95.3 kg   SpO2 96%   BMI 27.71 kg/m?  ? ?Visual Acuity ?Right Eye Distance:   ?Left Eye Distance:   ?Bilateral Distance:   ? ?Right Eye Near:   ?Left Eye Near:    ?Bilateral Near:    ? ?Physical Exam ?Constitutional:   ?   General: He is not in acute distress. ?   Appearance: He is well-developed. He is ill-appearing.  ?HENT:  ?   Head: Normocephalic and atraumatic.  ?   Right Ear: Ear canal and external ear normal.  ?   Left Ear: Tympanic membrane, ear canal and external ear normal.  ?   Ears:  ?   Comments: Right TM is dull and injected ?   Nose: Congestion and rhinorrhea present.  ?   Mouth/Throat:  ?   Pharynx: Posterior oropharyngeal erythema present.  ?   Comments: Facial sinuses tender ?Eyes:  ?   Conjunctiva/sclera: Conjunctivae normal.  ?   Pupils: Pupils are equal, round, and reactive to light.  ?Cardiovascular:  ?   Rate and Rhythm: Normal rate and regular rhythm.  ?   Heart sounds: Normal heart sounds.  ?Pulmonary:  ?   Effort: Pulmonary effort is normal. No respiratory distress.  ?   Breath sounds: No wheezing or rhonchi.  ?Abdominal:  ?   General: There is no distension.  ?   Palpations: Abdomen is soft.  ?Musculoskeletal:     ?   General: Normal range of motion.  ?   Cervical back: Normal range of motion.  ?Lymphadenopathy:  ?   Cervical: Cervical adenopathy present.  ?Skin: ?   General:  Skin is warm and dry.   ?Neurological:  ?   Mental Status: He is alert.  ?Psychiatric:     ?   Mood and Affect: Mood normal.     ?   Behavior: Behavior normal.  ? ? ? ?UC Treatments / Results  ?Labs ?(all labs ordered are listed, but only abnormal results are displayed) ?Labs Reviewed - No data to display ? ?EKG ? ? ?Radiology ?No results found. ? ?Procedures ?Procedures (including critical care time) ? ?Medications Ordered in UC ?Medications - No data to display ? ?Initial Impression / Assessment and Plan / UC Course  ?I have reviewed the triage vital signs and the nursing notes. ? ?Pertinent labs & imaging results that were available during my care of the patient were reviewed by me and considered in my medical decision making (see chart for details). ? ?  ? ?Final Clinical Impressions(s) / UC Diagnoses  ? ?Final diagnoses:  ?Acute cough  ?Acute otalgia, right  ?Acute bacterial sinusitis  ? ? ? ?Discharge Instructions   ? ?  ?May take mucinex DM for cough ?May take coricidin HBP for runny nose and sinus ?Take the antibiotic 2 x a day for 7-10 days ?Take the prednisone for 5 days ?Drink lots of water ? ? ?ED Prescriptions   ? ? Medication Sig Dispense Auth. Provider  ? predniSONE (DELTASONE) 20 MG tablet Take 2 tabs PO daily x 5 days. 10 tablet Raylene Everts, MD  ? cephALEXin (KEFLEX) 500 MG capsule Take 1 capsule (500 mg total) by mouth 2 (two) times daily. 20 capsule Raylene Everts, MD  ? ?  ? ?PDMP not reviewed this encounter. ?  ?Raylene Everts, MD ?05/23/21 1018 ? ?

## 2021-10-29 ENCOUNTER — Ambulatory Visit (INDEPENDENT_AMBULATORY_CARE_PROVIDER_SITE_OTHER): Payer: 59 | Admitting: Family Medicine

## 2021-10-29 ENCOUNTER — Encounter: Payer: Self-pay | Admitting: Family Medicine

## 2021-10-29 VITALS — BP 126/75 | HR 106 | Temp 98.5°F | Wt 211.0 lb

## 2021-10-29 DIAGNOSIS — J329 Chronic sinusitis, unspecified: Secondary | ICD-10-CM

## 2021-10-29 DIAGNOSIS — J40 Bronchitis, not specified as acute or chronic: Secondary | ICD-10-CM

## 2021-10-29 MED ORDER — DOXYCYCLINE HYCLATE 100 MG PO TABS: 100.0000 mg | ORAL_TABLET | Freq: Two times a day (BID) | ORAL | 0 refills | Status: DC

## 2021-10-29 NOTE — Progress Notes (Signed)
   Acute Office Visit  Subjective:     Patient ID: Harold Edwards, male    DOB: [birth year], 66 y.o.   MRN: [id number]  Chief Complaint  Patient presents with   Cough    HPI Patient is in today for Cough for 1 week.  He came in today because in the last couple of days he actually feels like he is getting worse.  He says he is getting some yellow and clear sputum.  He has a lot of pain in his upper back and in the back of his head.  He is got a sore throat in the last 24 hours.  He has not noticed any wheezing.  No fevers or chills.  He has had pneumonia multiple times as an adult. Neg home COVID test yesterday.  Pretty sure he got it from his grandchildren.  ROS      Objective:    BP 126/75   Pulse (!) 106   Temp 98.5 F (36.9 C)   Wt 211 lb (95.7 kg)   SpO2 98%   BMI 27.84 kg/m    Physical Exam Constitutional:      Appearance: He is well-developed.  HENT:     Head: Normocephalic and atraumatic.     Right Ear: Tympanic membrane, ear canal and external ear normal.     Left Ear: Tympanic membrane, ear canal and external ear normal.     Nose: Nose normal.     Mouth/Throat:     Comments: Cobblestoning in throat Eyes:     Conjunctiva/sclera: Conjunctivae normal.     Pupils: Pupils are equal, round, and reactive to light.  Neck:     Thyroid: No thyromegaly.  Cardiovascular:     Rate and Rhythm: Normal rate.     Heart sounds: Normal heart sounds.  Pulmonary:     Effort: Pulmonary effort is normal.     Breath sounds: Normal breath sounds.  Musculoskeletal:     Cervical back: Neck supple.  Lymphadenopathy:     Cervical: No cervical adenopathy.  Skin:    General: Skin is warm and dry.  Neurological:     Mental Status: He is alert and oriented to person, place, and time.     No results found for any visits on 10/29/21.      Assessment & Plan:   Problem List Items Addressed This Visit   None Visit Diagnoses     Sinobronchitis    -  Primary   Relevant  Medications   doxycycline (VIBRA-TABS) 100 MG tablet     Sinobronchitis-we will go ahead and treat with doxycycline.  If not feeling somewhat better after the weekend then please let us know.  Also discussed that this may still be viral but since he is getting worse we will go ahead and treat.  Meds ordered this encounter  Medications   doxycycline (VIBRA-TABS) 100 MG tablet    Sig: Take 1 tablet (100 mg total) by mouth 2 (two) times daily.    Dispense:  14 tablet    Refill:  0    No follow-ups on file.  Beatrice Lecher, MD

## 2021-10-31 ENCOUNTER — Ambulatory Visit
Admission: EM | Admit: 2021-10-31 | Discharge: 2021-10-31 | Disposition: A | Discharge status: Home / Self Care | Payer: 59 | Attending: Internal Medicine | Admitting: Internal Medicine

## 2021-10-31 ENCOUNTER — Other Ambulatory Visit: Payer: Self-pay

## 2021-10-31 ENCOUNTER — Ambulatory Visit (INDEPENDENT_AMBULATORY_CARE_PROVIDER_SITE_OTHER): Payer: 59

## 2021-10-31 ENCOUNTER — Telehealth: Payer: 59 | Admitting: Nurse Practitioner

## 2021-10-31 DIAGNOSIS — J329 Chronic sinusitis, unspecified: Secondary | ICD-10-CM

## 2021-10-31 DIAGNOSIS — R0602 Shortness of breath: Secondary | ICD-10-CM

## 2021-10-31 DIAGNOSIS — R059 Cough, unspecified: Secondary | ICD-10-CM | POA: Diagnosis not present

## 2021-10-31 DIAGNOSIS — J189 Pneumonia, unspecified organism: Secondary | ICD-10-CM

## 2021-10-31 MED ORDER — BENZONATATE 100 MG PO CAPS: 100.0000 mg | ORAL_CAPSULE | Freq: Three times a day (TID) | ORAL | 0 refills | Status: DC | PRN

## 2021-10-31 MED ORDER — IBUPROFEN 600 MG PO TABS: 600.0000 mg | ORAL_TABLET | Freq: Four times a day (QID) | ORAL | 0 refills | Status: DC | PRN

## 2021-10-31 MED ORDER — CEFDINIR 300 MG PO CAPS: 300.0000 mg | ORAL_CAPSULE | Freq: Two times a day (BID) | ORAL | 0 refills | Status: DC

## 2021-10-31 NOTE — ED Triage Notes (Addendum)
Pt c/o continued cough, headache and sore throat x 10 days. Saw PCP on Thurs, rx'd Doxycycline. Feels a burning sensation in chest. No improvement. Hx of bronchitis and pneumonia.

## 2021-10-31 NOTE — Discharge Instructions (Addendum)
Please increase oral fluid intake Take medications as prescribed Continue taking doxycycline as prescribed by your primary care provider Your chest x-ray is significant for pneumonia Return to urgent care if you have worsening symptoms.

## 2021-10-31 NOTE — Progress Notes (Signed)
Based on what you shared with me it looks like you have sinusitis being treated by doxycycline,that should be evaluated in a face to face office visit. The antibiotics prescribed can take several days  to be effective. If  you are not  improving you will need a face to face visit for further treatment.  Mary-Margaret Hassell Done, FNP   NOTE: There will be NO CHARGE for this eVisit   If you are having a true medical emergency please call 911.      For an urgent face to face visit, [facility] has six urgent care centers for your convenience:     [facility] [place] Get Driving Directions [phone] [address] [place], [place] [address]    [facility]) Get Driving Directions [phone] [place], [place] [address]  [facility] ([place]) Get Driving Directions [phone] [address] [place],  [place] [place]  [address]  [facility] Get Driving Directions [phone] [address] [place], [place], [place] [address]   [facility] Get Driving Directions  [phone] [address] [place], [place] [address]   [facility] [place] Get Driving Directions [phone] [address], [place],  [address]  Your MyChart E-visit questionnaire answers were reviewed by a board certified advanced clinical practitioner to complete your personal care plan based on your specific symptoms.  Thank you for using e-Visits.

## 2021-10-31 NOTE — ED Provider Notes (Signed)
[name]    CSN: [id number] Arrival date & time: 10/31/21  0914      History   Chief Complaint Chief Complaint  Patient presents with   Cough   Headache   Sore Throat    HPI Harold Edwards is a 66 y.o. male comes to the urgent care with 4 to 5-day history of cough, shortness of breath, headaches and a sore throat.  Patient started experiencing symptoms about a week ago.  He was seen by his primary care provider and started on doxycycline with no improvement in his symptoms.  He returns to urgent care for worsening shortness of breath and worsening cough.  Shortness of breath is on exertion and is associated with a cough not productive of sputum.  He denies any fever or chills.  No nausea, vomiting or diarrhea.  Patient was exposed to his grandchildren about a week and a half ago.  Grandchildren had runny nose and cough but they have since recovered.  Cough is associated with left-sided chest pain and left upper back pain. HPI  Past Medical History:  Diagnosis Date   Back pain, chronic    Cancer ([place])    Chronic cough 09/19/2017   Influenza    Pneumonia    Prostate cancer ([place]) 02/21/2014   4+4=8 s/p TRUS biopsy by Dr. Elnoria Howard on 01/18/14  Last Assessment & Plan:   Patient be managed by urology.  Patient is status post prostatectomy and radiation therapy.  Denies hematuria.   Sinus tachycardia 09/13/2017   Stress incontinence 04/10/2014    Patient Active Problem List   Diagnosis Date Noted   Ventral hernia without obstruction or gangrene 11/16/2020   Lipoma of torso 11/16/2020   Irritable bowel syndrome with both constipation and diarrhea 05/39/7673   Acute metabolic encephalopathy    Hypokalemia    AMS (altered mental status) 04/28/2020   Gastroenteritis 04/28/2020   Aortic atherosclerosis ([place]) 04/28/2020   Senile purpura ([place]) 10/31/2018   Former smoker, stopped smoking in distant past 04/20/2018   Lumbar back pain with radiculopathy affecting right lower  extremity 04/14/2018   Chronic cough 09/19/2017   Mixed hyperlipidemia 09/19/2017   Medication side effects 09/13/2017   Sinus tachycardia 09/13/2017   ED (erectile dysfunction) 04/10/2014   Stress incontinence 04/10/2014   S/P prostatectomy 03/18/2014   History of prostate cancer 02/21/2014    Past Surgical History:  Procedure Laterality Date   ABDOMINAL SURGERY     PROSTATE SURGERY     Prostatectomy robot. Dr Tommi Rumps and Dr Redmond Pulling   TONSILLECTOMY         Home Medications    Prior to Admission medications   Medication Sig Start Date End Date Taking? Authorizing Provider  benzonatate (TESSALON) 100 MG capsule Take 1 capsule (100 mg total) by mouth 3 (three) times daily as needed for cough. 10/31/21  Yes Nataliya Graig, Myrene Galas, MD  cefdinir (OMNICEF) 300 MG capsule Take 1 capsule (300 mg total) by mouth 2 (two) times daily for 7 days. 10/31/21 11/07/21 Yes Jackelynn Hosie, Myrene Galas, MD  ibuprofen (ADVIL) 600 MG tablet Take 1 tablet (600 mg total) by mouth every 6 (six) hours as needed. 10/31/21  Yes Mekayla Soman, Myrene Galas, MD  doxycycline (VIBRA-TABS) 100 MG tablet Take 1 tablet (100 mg total) by mouth 2 (two) times daily. 10/29/21   Hali Marry, MD    Family History Family History  Problem Relation Age of Onset   Breast cancer Other    Diabetes Other  Cancer Mother    Diabetes Mother    Cancer Father    Diabetes Father     Social History Social History   Tobacco Use   Smoking status: Former    Packs/day: 1.00    Years: 13.00    Total pack years: 13.00    Types: Cigarettes    Start date: 02/08/1974    Quit date: 10/02/1988    Years since quitting: 33.1   Smokeless tobacco: Never  Vaping Use   Vaping Use: Never used  Substance Use Topics   Alcohol use: Yes    Comment: rarely   Drug use: Never     Allergies   Amoxicillin   Review of Systems Review of Systems  Constitutional:  Positive for activity change and fatigue. Negative for chills and fever.  Respiratory:   Positive for cough, chest tightness and shortness of breath. Negative for wheezing.   Cardiovascular:  Positive for chest pain.  Gastrointestinal: Negative.   Genitourinary: Negative.   Musculoskeletal: Negative.   Neurological:  Positive for headaches.  Psychiatric/Behavioral: Negative.       Physical Exam Triage Vital Signs ED Triage Vitals  Enc Vitals Group     BP 10/31/21 0921 132/81     Pulse Rate 10/31/21 0921 (!) 118     Resp 10/31/21 0921 18     Temp 10/31/21 0921 98 F (36.7 C)     Temp Source 10/31/21 0921 Oral     SpO2 10/31/21 0921 97 %     Weight --      Height --      Head Circumference --      Peak Flow --      Pain Score 10/31/21 0922 0     Pain Loc --      Pain Edu? --      Excl. in [place]? --    No data found.  Updated Vital Signs BP 132/81 (BP Location: Right Arm)   Pulse (!) 118   Temp 98 F (36.7 C) (Oral)   Resp 18   SpO2 97%   Visual Acuity Right Eye Distance:   Left Eye Distance:   Bilateral Distance:    Right Eye Near:   Left Eye Near:    Bilateral Near:     Physical Exam Vitals and nursing note reviewed.  Constitutional:      General: He is not in acute distress.    Appearance: He is not ill-appearing.  Cardiovascular:     Rate and Rhythm: Normal rate and regular rhythm.     Heart sounds: Normal heart sounds. No murmur heard. Pulmonary:     Effort: Pulmonary effort is normal.     Breath sounds: Normal breath sounds.  Abdominal:     General: Bowel sounds are normal.     Palpations: Abdomen is soft.  Neurological:     Mental Status: He is alert.     GCS: GCS eye subscore is 4. GCS verbal subscore is 5. GCS motor subscore is 6.      UC Treatments / Results  Labs (all labs ordered are listed, but only abnormal results are displayed) Labs Reviewed - No data to display  EKG   Radiology DG Chest 2 View  Result Date: 10/31/2021 CLINICAL DATA:  Cough and shortness of breath EXAM: CHEST - 2 VIEW COMPARISON:  Chest x-ray  August 24, 2019. CT the abdomen and pelvis April 28, 2020. FINDINGS: No pneumothorax. The cardiomediastinal silhouette is stable. The right lung is clear.  Mild opacity in left base is identified. No nodules or masses. No other acute abnormalities. IMPRESSION: Mild opacity in left base was present previously but is slightly more pronounced. The finding could represent atelectasis. Early developing infiltrate not completely excluded. Recommend short-term follow-up imaging to ensure resolution. Electronically Signed   By: Dorise Bullion III M.D.   On: 10/31/2021 09:51    Procedures Procedures (including critical care time)  Medications Ordered in UC Medications - No data to display  Initial Impression / Assessment and Plan / UC Course  I have reviewed the triage vital signs and the nursing notes.  Pertinent labs & imaging results that were available during my care of the patient were reviewed by me and considered in my medical decision making (see chart for details).     1.  Community-acquired pneumonia involving the left lung: Chest x-ray significant for left lower lobe infiltrate versus atelectasis Patient is advised to continue doxycycline I will add Omnicef 300 mg twice daily for 7 days Maintain adequate hydration Return precautions given Tessalon Perles as needed for cough Ibuprofen as needed for pain. Final Clinical Impressions(s) / UC Diagnoses   Final diagnoses:  Community acquired pneumonia of left lower lobe of lung     Discharge Instructions      Please increase oral fluid intake Take medications as prescribed Continue taking doxycycline as prescribed by your primary care provider Your chest x-ray is significant for pneumonia Return to urgent care if you have worsening symptoms.    ED Prescriptions     Medication Sig Dispense Auth. Provider   cefdinir (OMNICEF) 300 MG capsule Take 1 capsule (300 mg total) by mouth 2 (two) times daily for 7 days. 14 capsule Man Bonneau,  Myrene Galas, MD   benzonatate (TESSALON) 100 MG capsule Take 1 capsule (100 mg total) by mouth 3 (three) times daily as needed for cough. 30 capsule Harsimran Westman, Myrene Galas, MD   ibuprofen (ADVIL) 600 MG tablet Take 1 tablet (600 mg total) by mouth every 6 (six) hours as needed. 30 tablet Ailyne Pawley, Myrene Galas, MD      PDMP not reviewed this encounter.   Chase Picket, MD 10/31/21 [phone]

## 2021-11-01 ENCOUNTER — Telehealth: Payer: Self-pay | Admitting: Emergency Medicine

## 2021-11-01 NOTE — Telephone Encounter (Signed)
LMOVM of how he was doing from his visit on 10/31/2021

## 2021-11-02 ENCOUNTER — Encounter: Payer: Self-pay | Admitting: Family Medicine

## 2021-11-02 NOTE — Telephone Encounter (Signed)
Note written

## 2021-11-05 ENCOUNTER — Ambulatory Visit (INDEPENDENT_AMBULATORY_CARE_PROVIDER_SITE_OTHER): Payer: 59

## 2021-11-05 ENCOUNTER — Encounter: Payer: Self-pay | Admitting: Family Medicine

## 2021-11-05 ENCOUNTER — Ambulatory Visit (INDEPENDENT_AMBULATORY_CARE_PROVIDER_SITE_OTHER): Payer: 59 | Admitting: Family Medicine

## 2021-11-05 VITALS — BP 128/84 | HR 102 | Ht 73.0 in | Wt 212.0 lb

## 2021-11-05 DIAGNOSIS — J189 Pneumonia, unspecified organism: Secondary | ICD-10-CM

## 2021-11-05 DIAGNOSIS — R062 Wheezing: Secondary | ICD-10-CM | POA: Diagnosis not present

## 2021-11-05 DIAGNOSIS — R051 Acute cough: Secondary | ICD-10-CM | POA: Diagnosis not present

## 2021-11-05 MED ORDER — LEVOFLOXACIN 750 MG PO TABS: 750.0000 mg | ORAL_TABLET | Freq: Every day | ORAL | 0 refills | Status: DC

## 2021-11-05 MED ORDER — PREDNISONE 20 MG PO TABS: 20.0000 mg | ORAL_TABLET | Freq: Every day | ORAL | 0 refills | Status: AC

## 2021-11-05 MED ORDER — ALBUTEROL SULFATE HFA 108 (90 BASE) MCG/ACT IN AERS: 2.0000 | INHALATION_SPRAY | Freq: Four times a day (QID) | RESPIRATORY_TRACT | 0 refills | Status: DC | PRN

## 2021-11-05 NOTE — Progress Notes (Signed)
Acute Office Visit  Subjective:     Patient ID: Harold Edwards, male    DOB: 1956-01-04, 66 y.o.   MRN: [id number]  Chief Complaint  Patient presents with   Pneumonia    HPI Patient is in today for acute sick visit. He was seen at urgent care and diagnosed with pneumonia. He was given cefdinir in addition to his doxycycline he was on. He was also given tessalon perles for cough and ibuprofen for pain. He presents today because he is still not feeling better and was sent home from work. He is having a bad cough.    Review of Systems  Constitutional:  Negative for chills and fever.  Respiratory:  Positive for cough and wheezing. Negative for shortness of breath.   Cardiovascular:  Negative for chest pain.  Neurological:  Negative for headaches.        Objective:    BP 128/84   Pulse (!) 102   Ht '6\' 1"'$  (1.854 m)   Wt 212 lb (96.2 kg)   SpO2 98%   BMI 27.97 kg/m    Physical Exam Vitals and nursing note reviewed.  Constitutional:      General: He is not in acute distress.    Appearance: Normal appearance.  HENT:     Head: Normocephalic and atraumatic.     Right Ear: External ear normal.     Left Ear: External ear normal.     Nose: Nose normal.  Eyes:     Conjunctiva/sclera: Conjunctivae normal.  Cardiovascular:     Rate and Rhythm: Normal rate and regular rhythm.  Pulmonary:     Effort: Pulmonary effort is normal.     Breath sounds: Normal breath sounds.  Neurological:     General: No focal deficit present.     Mental Status: He is alert and oriented to person, place, and time.  Psychiatric:        Mood and Affect: Mood normal.        Behavior: Behavior normal.        Thought Content: Thought content normal.        Judgment: Judgment normal.     No results found for any visits on 11/05/21.      Assessment & Plan:   Problem List Items Addressed This Visit       Respiratory   Pneumonia due to infectious organism - Primary    - since pt has not  improved at all with abx per his report have switched abx to levaquin for 5 days.  - will also go ahead and give him solumedrol '125mg'$  in clinic - will take prednisone '20mg'$  starting tomorrow  - starting albuterol for wheezing.       Relevant Medications   levofloxacin (LEVAQUIN) 750 MG tablet   albuterol (VENTOLIN HFA) 108 (90 Base) MCG/ACT inhaler   Other Relevant Orders   DG Chest 2 View     Other   Wheezing   Relevant Medications   albuterol (VENTOLIN HFA) 108 (90 Base) MCG/ACT inhaler   Other Visit Diagnoses     Acute cough       Relevant Medications   predniSONE (DELTASONE) 20 MG tablet       Meds ordered this encounter  Medications   levofloxacin (LEVAQUIN) 750 MG tablet    Sig: Take 1 tablet (750 mg total) by mouth daily.    Dispense:  7 tablet    Refill:  0   predniSONE (DELTASONE) 20 MG tablet  Sig: Take 1 tablet (20 mg total) by mouth daily with breakfast for 5 days.    Dispense:  5 tablet    Refill:  0   albuterol (VENTOLIN HFA) 108 (90 Base) MCG/ACT inhaler    Sig: Inhale 2 puffs into the lungs every 6 (six) hours as needed for wheezing or shortness of breath.    Dispense:  8 g    Refill:  0    Return if symptoms worsen or fail to improve.  Owens Loffler, DO

## 2021-11-05 NOTE — Assessment & Plan Note (Signed)
-   since pt has not improved at all with abx per his report have switched abx to levaquin for 5 days.  - will also go ahead and give him solumedrol '125mg'$  in clinic - will take prednisone '20mg'$  starting tomorrow  - starting albuterol for wheezing.

## 2021-11-06 ENCOUNTER — Encounter: Payer: Self-pay | Admitting: Family Medicine

## 2021-11-07 ENCOUNTER — Encounter: Payer: Self-pay | Admitting: Family Medicine

## 2021-11-09 ENCOUNTER — Encounter: Payer: Self-pay | Admitting: Family Medicine

## 2021-11-09 ENCOUNTER — Other Ambulatory Visit: Payer: Self-pay | Admitting: Family Medicine

## 2021-11-09 DIAGNOSIS — R918 Other nonspecific abnormal finding of lung field: Secondary | ICD-10-CM

## 2021-11-13 ENCOUNTER — Encounter: Payer: Self-pay | Admitting: Physician Assistant

## 2021-11-24 ENCOUNTER — Ambulatory Visit (INDEPENDENT_AMBULATORY_CARE_PROVIDER_SITE_OTHER): Payer: 59 | Admitting: Family Medicine

## 2021-11-24 ENCOUNTER — Encounter: Payer: Self-pay | Admitting: Family Medicine

## 2021-11-24 VITALS — BP 134/75 | HR 112 | Ht 73.0 in | Wt 213.0 lb

## 2021-11-24 DIAGNOSIS — M722 Plantar fascial fibromatosis: Secondary | ICD-10-CM | POA: Diagnosis not present

## 2021-11-24 DIAGNOSIS — M5416 Radiculopathy, lumbar region: Secondary | ICD-10-CM | POA: Diagnosis not present

## 2021-11-24 MED ORDER — MELOXICAM 15 MG PO TABS: 15.0000 mg | ORAL_TABLET | Freq: Every day | ORAL | 0 refills | Status: DC

## 2021-11-24 MED ORDER — KETOROLAC TROMETHAMINE 60 MG/2ML IM SOLN: 60.0000 mg | Freq: Once | INTRAMUSCULAR | Status: AC

## 2021-11-24 NOTE — Assessment & Plan Note (Signed)
-   based on history and physical exam of palpation to plantar aspect of foot near heel this is like plantar fasciitis - discussed rolling a tennis ball or a frozen water bottle to the bottom of his foot to try and break up the fibrotic tissue.  - given meloxicam for pain control and encouraged the need for "working out" and stretching to improve symptoms despite pain  - provided pt with handout of stretches to work on  - given toradol '60mg'$  shot in clinic due to amount of pain.  - recommended formal PT, but patient would like to try home exercises first

## 2021-11-24 NOTE — Progress Notes (Signed)
Acute Office Visit  Subjective:     Patient ID: Harold Edwards, male    DOB: 09-27-1955, 66 y.o.   MRN: [id number]  Chief Complaint  Patient presents with   Foot Pain    HPI Patient is in today for left heel pain. He says pain is worse with first steps in the morning. Describes pain as "needles under foot." He does work for [employer] and is constantly getting in and out of the truck. Shoe wear is mainly work boots to which he says have a good sole on them.   Pt is also having back pain that is consistent with his chronic sciatic pain. He says he has gotten shots in the past and lately it is acting up. He is requesting a strong pain medicine his pcp put him on in the past.   Review of Systems  Constitutional:  Negative for chills and fever.  Respiratory:  Negative for cough and shortness of breath.   Cardiovascular:  Negative for chest pain.  Musculoskeletal:  Positive for back pain.       L Foot pain  Neurological:  Negative for headaches.        Objective:    BP 134/75   Pulse (!) 112   Ht '6\' 1"'$  (1.854 m)   Wt 213 lb (96.6 kg)   SpO2 100%   BMI 28.10 kg/m    Physical Exam Vitals and nursing note reviewed.  Constitutional:      General: He is not in acute distress.    Appearance: Normal appearance.  HENT:     Head: Normocephalic and atraumatic.     Right Ear: External ear normal.     Left Ear: External ear normal.     Nose: Nose normal.  Eyes:     Conjunctiva/sclera: Conjunctivae normal.  Pulmonary:     Effort: Pulmonary effort is normal.  Musculoskeletal:     Comments: Left foot pain tenderness to palpation  Paraspinal tenderness of lumbar spine. No tenderness to C, T, or L spinous processes  Neurological:     General: No focal deficit present.     Mental Status: He is alert and oriented to person, place, and time.  Psychiatric:        Mood and Affect: Mood normal.        Behavior: Behavior normal.        Thought Content: Thought content normal.         Judgment: Judgment normal.     No results found for any visits on 11/24/21.      Assessment & Plan:   Problem List Items Addressed This Visit       Nervous and Auditory   Lumbar back pain with radiculopathy affecting right lower extremity    - pt presents with similar pain as years ago. Discussed various treatment options such as muscle relaxer, PT, referral to back specialist vs another round of epidural injections. Offered gabapentin.  - pt said the only thing that helped was tramadol given by PCP years ago. I have reached out to PCP because I would prefer he try more conservative measures first       Relevant Medications   meloxicam (MOBIC) 15 MG tablet     Musculoskeletal and Integument   Plantar fasciitis of left foot - Primary    - based on history and physical exam of palpation to plantar aspect of foot near heel this is like plantar fasciitis - discussed rolling a tennis ball or a  frozen water bottle to the bottom of his foot to try and break up the fibrotic tissue.  - given meloxicam for pain control and encouraged the need for "working out" and stretching to improve symptoms despite pain  - provided pt with handout of stretches to work on  - given toradol '60mg'$  shot in clinic due to amount of pain.  - recommended formal PT, but patient would like to try home exercises first        Meds ordered this encounter  Medications   meloxicam (MOBIC) 15 MG tablet    Sig: Take 1 tablet (15 mg total) by mouth daily.    Dispense:  30 tablet    Refill:  0    No follow-ups on file.  Owens Loffler, DO

## 2021-11-24 NOTE — Addendum Note (Signed)
Addended by: Cline Crock on: 11/24/2021 04:24 PM   Modules accepted: Orders

## 2021-11-24 NOTE — Assessment & Plan Note (Signed)
-   pt presents with similar pain as years ago. Discussed various treatment options such as muscle relaxer, PT, referral to back specialist vs another round of epidural injections. Offered gabapentin.  - pt said the only thing that helped was tramadol given by PCP years ago. I have reached out to PCP because I would prefer he try more conservative measures first

## 2022-08-04 ENCOUNTER — Ambulatory Visit (INDEPENDENT_AMBULATORY_CARE_PROVIDER_SITE_OTHER): Payer: Medicare Other | Admitting: Physician Assistant

## 2022-08-04 ENCOUNTER — Encounter: Payer: Self-pay | Admitting: Physician Assistant

## 2022-08-04 VITALS — BP 130/81 | HR 107 | Ht 73.0 in | Wt 213.0 lb

## 2022-08-04 DIAGNOSIS — M51369 Other intervertebral disc degeneration, lumbar region without mention of lumbar back pain or lower extremity pain: Secondary | ICD-10-CM | POA: Insufficient documentation

## 2022-08-04 DIAGNOSIS — D692 Other nonthrombocytopenic purpura: Secondary | ICD-10-CM | POA: Diagnosis not present

## 2022-08-04 DIAGNOSIS — M5416 Radiculopathy, lumbar region: Secondary | ICD-10-CM

## 2022-08-04 DIAGNOSIS — M5136 Other intervertebral disc degeneration, lumbar region: Secondary | ICD-10-CM

## 2022-08-04 MED ORDER — PREDNISONE 50 MG PO TABS: ORAL_TABLET | ORAL | 0 refills | Status: DC

## 2022-08-04 MED ORDER — TRAMADOL HCL 50 MG PO TABS: 50.0000 mg | ORAL_TABLET | Freq: Three times a day (TID) | ORAL | 0 refills | Status: DC | PRN

## 2022-08-04 MED ORDER — CYCLOBENZAPRINE HCL 10 MG PO TABS: 10.0000 mg | ORAL_TABLET | Freq: Three times a day (TID) | ORAL | 0 refills | Status: DC | PRN

## 2022-08-04 MED ORDER — KETOROLAC TROMETHAMINE 60 MG/2ML IM SOLN
60.0000 mg | Freq: Once | INTRAMUSCULAR | Status: AC
  Administered 2022-08-04: 60 mg via INTRAMUSCULAR

## 2022-08-04 NOTE — Progress Notes (Signed)
Acute Office Visit  Subjective:     Patient ID: Harold Edwards, male    DOB: Apr 23, 1955, 67 y.o.   MRN: [id number]  Chief Complaint  Patient presents with   Back Pain    Patient with a PMH of lumbar DDD presents with severe back pain x1 day. States yesterday he was lifting a cabinet with proper form but experienced extreme 12/10 pain upon standing. The pain starts in the center of his lower back and radiates across his lower back, down into his R buttock, and across his anterior R thigh lateromedially. The pain stops at his knee which is reportedly different from his previous, chronic back pain which radiated into his toes. It is worst when he is sitting or laying, but standing is painful too. He was unable to sleep last night due to pain elicited by laying down. Movement and walking are painful and stiff. Ibuprofen, stretching, and biofreeze have not helped. He denies saddle paresthesia, bladder or fecal incontinence, GU sxs, neuro deficit, and numbness.  Patient also reports concern about bruising and bleeding very easily over the last year. He denies massive blood loss, major trauma, blood in stool, fatigue, and use of blood thinners including aspirin. States he eats red meat and leafy greens.    Review of Systems  Constitutional:  Negative for chills, fever and weight loss.  Gastrointestinal:  Negative for blood in stool, nausea and vomiting.  Genitourinary:  Negative for dysuria.  Musculoskeletal:  Positive for back pain. Negative for neck pain.  Neurological:  Positive for tingling and weakness. Negative for dizziness, sensory change and headaches.  Endo/Heme/Allergies:  Bruises/bleeds easily.        Objective:    BP 130/81 (BP Location: Right Arm, Patient Position: Sitting, Cuff Size: Normal)   Pulse (!) 107   Ht 6\' 1"  (1.854 m)   Wt 213 lb (96.6 kg)   SpO2 94%   BMI 28.10 kg/m  BP Readings from Last 3 Encounters:  08/04/22 130/81  11/24/21 134/75  11/05/21 128/84    Wt Readings from Last 3 Encounters:  08/04/22 213 lb (96.6 kg)  11/24/21 213 lb (96.6 kg)  11/05/21 212 lb (96.2 kg)      Physical Exam Musculoskeletal:        General: No swelling, tenderness or deformity.     Right lower leg: No edema.     Left lower leg: No edema.     Comments: All spinal ROM limited due to severe pain  Skin:    Findings: Bruising present. No erythema.     Comments: Bruising over bilateral lower arms.   Neurological:     General: No focal deficit present.     Mental Status: He is alert and oriented to person, place, and time.     Sensory: No sensory deficit.     Gait: Gait abnormal.     Deep Tendon Reflexes: Reflexes normal.     Comments: R hip flexion and knee extension +4/5. L hip and knee +5/5. Positive str leg raise b/l.         Assessment & Plan:  Harold Edwards KitchenMarland KitchenLadanian was seen today for back pain.  Diagnoses and all orders for this visit:  Lumbar back pain with radiculopathy affecting right lower extremity -     cyclobenzaprine (FLEXERIL) 10 MG tablet; Take 1 tablet (10 mg total) by mouth 3 (three) times daily as needed for muscle spasms. -     predniSONE (DELTASONE) 50 MG tablet; One tab PO daily for  5 days. -     traMADol (ULTRAM) 50 MG tablet; Take 1 tablet (50 mg total) by mouth every 8 (eight) hours as needed for up to 5 days. -     ketorolac (TORADOL) injection 60 mg  DDD (degenerative disc disease), lumbar -     cyclobenzaprine (FLEXERIL) 10 MG tablet; Take 1 tablet (10 mg total) by mouth 3 (three) times daily as needed for muscle spasms. -     predniSONE (DELTASONE) 50 MG tablet; One tab PO daily for 5 days. -     traMADol (ULTRAM) 50 MG tablet; Take 1 tablet (50 mg total) by mouth every 8 (eight) hours as needed for up to 5 days. -     ketorolac (TORADOL) injection 60 mg  Senile purpura ([facility]) -     CBC w/Diff/Platelet -     COMPLETE METABOLIC PANEL WITH GFR   Flare up of DDD likely involving L4 given pattern of sxs Admin toradol shot in  office Steroid burst to start tomorrow Instructed not to take ibuprofen/NSAIDs while using steroids May begin flexeril when he arrives home Instructed not to drive when using flexeril due to sedation May use tramadol for breakthrough pain and help sleeping Discussed stretching once pain is controlled  Discussed nature of senile purpura Ordered labs to assess blood counts  return or go to ED if incontinence, saddle paresthesia, fever, or neuro deficits appear or if pain becomes intolerable  Harold Gaw, PA-C

## 2022-08-04 NOTE — Patient Instructions (Signed)
Start prednisone tomorrow morning Tramadol and flexeril as needed   Herniated Disk  A herniated disk, also called a ruptured disk or slipped disk, occurs when a disk in the spine bulges out too far. Between the bones in the spine (vertebrae), there are oval disks that are made of a soft, spongy center filled with liquid that is surrounded by a tough outer ring. The disks connect the vertebrae, help the spine move, and keep the bones from rubbing against each other when you move. When you have a herniated disk, the spongy center of the disk bulges out or breaks through the outer ring. The spongy center can press on a nerve between the vertebrae and cause pain. This can occur anywhere in the back or neck area, but the lower back is most commonly affected. What are the causes? This condition may be caused by: Age-related wear and tear. Sudden injury, such as a strain or sprain. What increases the risk? The following factors may make you more likely to develop this condition: Aging. This is the main risk factor for a herniated disk. Being a man who is 61-30 years old. Frequently doing activities that involve heavy lifting, bending, or twisting. Not getting enough exercise. Being overweight. Using tobacco products. What are the signs or symptoms? Symptoms may vary depending on where your herniated disk is located. A herniated disk in the lower back may cause: Sharp pain in part of the hips, buttocks, or legs. Sharp pain in the lower back, spreading down through the leg into the foot (sciatica). A herniated disk in the neck may cause dizziness and vertigo. It may also cause pain or weakness in the neck, shoulder, or upper arm, forearm, or fingers. You may also have muscle weakness. You may have trouble: Lifting your leg or arm. Standing on your toes. Squeezing tightly with one of your hands. Other symptoms may include: Numbness or tingling in the affected areas of the hands, arms, feet, or  legs. Inability to control when you urinate or when you have bowel movements. This is a rare but serious sign of a severe herniated disk in the lower back. How is this diagnosed? This condition may be diagnosed based on: Your symptoms. Your medical history. A physical exam. The exam may include: A straight-leg test. For this test, you will lie on your back while your health care provider lifts your leg, keeping your knee straight. If you feel pain, you likely have a herniated disk. Neurologic tests. These include checking for numbness, reflexes, muscle strength, and problems with posture. Imaging tests, such as: X-rays. MRI. CT scan. Electromyogram (EMG) to check the nerves that control muscles. How is this treated? Treatment for this condition may include: A period of light, painless activity for a few days to several weeks. Complete bed rest is not recommended. If you have a herniated disk in your lower back, avoid sitting as it increases pressure on the disk. Medicines. These may include: NSAIDs, such as ibuprofen, to help reduce pain and swelling. Muscle relaxants to prevent sudden tightening of the back muscles (back spasms). Prescription pain medicines, if you have severe pain. Ice or heat therapy. Steroid injections in the area of the herniated disk. These can help reduce pain and swelling. Physical therapy to strengthen your back muscles. In many cases, symptoms go away with treatment over a period of days or weeks. You will most likely be free of symptoms after 3-4 months. If other treatments do not help to relieve your symptoms,  you may need surgery. Follow these instructions at home: Medicines Take over-the-counter and prescription medicines only as told by your health care provider. Ask your health care provider if the medicine prescribed to you: Requires you to avoid driving or using heavy machinery. Can cause constipation. You may need to take these actions to prevent or  treat constipation: Drink enough fluid to keep your urine pale yellow. Take over-the-counter or prescription medicines. Eat foods that are high in fiber, such as beans, whole grains, and fresh fruits and vegetables. Limit foods that are high in fat and processed sugars, such as fried or sweet foods. Managing pain, stiffness, and swelling     If directed, put ice on the painful area. Icing can help to relieve pain. To do this: Put ice in a plastic bag. Place a towel between your skin and the bag. Leave the ice on for 20 minutes, 2-3 times a day. Remove the ice if your skin turns bright red. This is very important. If you cannot feel pain, heat, or cold, you have a greater risk of damage to the area. If directed, apply heat to the painful area as often as told by your health care provider. Heat can reduce the stiffness of your muscles. Use the heat source that your health care provider recommends, such as a moist heat pack or a heating pad. Place a towel between your skin and the heat source. Leave the heat on for 20-30 minutes. Remove the heat if your skin turns bright red. This is especially important if you are unable to feel pain, heat, or cold. You may have a greater risk of getting burned. Activity Avoid strict bed rest but only do activities that are painless. Gradually return to your normal activities and exercise as told by your health care provider. Ask your health care provider what activities and exercises are safe for you. Use good posture. Avoid movements that cause pain. Do not lift anything that is heavier than 10 lb (4.5 kg), or the limit that you are told, until your health care provider says that it is safe. Do not sit or stand for long periods of time without changing positions. Do not sit for long periods of time without getting up and moving around. If physical therapy was prescribed, do exercises as told by your health care provider. Aim to strengthen muscles in your  back and abdomen with exercises such as swimming or walking. General instructions Do not use any products that contain nicotine or tobacco, such as cigarettes, e-cigarettes, and chewing tobacco. These products can delay healing. If you need help quitting, ask your health care provider. Do not wear high-heeled shoes. Do not sleep on your abdomen. If you are overweight, work with your health care provider to lose weight safely. Keep all follow-up visits. This is important. How is this prevented? Maintain a healthy weight. Maintain physical fitness. Do at least 150 minutes of moderate-intensity exercise each week, such as brisk walking or water aerobics. When lifting objects: Keep your feet at least shoulder-width apart and tighten the muscles of your abdomen. Keep your spine neutral as you bend your knees and hips. It is important to lift using the strength of your legs, not your back. Do not lock your knees straight out. Always ask for help to lift heavy or awkward objects. Contact a health care provider if you: Have back pain or neck pain that does not get better after 6 weeks. Have severe pain in your back, neck, legs,  or arms. Develop numbness, tingling, or weakness anywhere in your body. Get help right away if: You cannot move your arms or legs. You cannot control when you urinate or have bowel movements. You feel dizzy or you faint. You have shortness of breath. These symptoms may represent a serious problem that is an emergency. Do not wait to see if the symptoms will go away. Get medical help right away. Call your local emergency services (911 in the [place] Do not drive yourself to the hospital. Summary A herniated disk, also called a ruptured disk or slipped disk, occurs when a disk in the spine bulges out too far (herniates). This condition may be caused by age-related wear and tear or a sudden injury. Symptoms may vary depending on where your herniated disk is located. Treatment  may include rest, medicines, ice or heat therapy, steroid injections, and physical therapy. If other treatments do not help to relieve your symptoms, you may need surgery. This information is not intended to replace advice given to you by your health care provider. Make sure you discuss any questions you have with your health care provider. Document Revised: 05/16/2019 Document Reviewed: 05/16/2019 Elsevier Patient Education  2024 [employer].

## 2022-08-06 ENCOUNTER — Ambulatory Visit: Payer: 59 | Admitting: Physician Assistant

## 2022-08-10 ENCOUNTER — Encounter: Payer: Self-pay | Admitting: Physician Assistant

## 2022-08-10 ENCOUNTER — Ambulatory Visit: Payer: Medicare Other | Admitting: Physical Therapy

## 2022-08-10 ENCOUNTER — Ambulatory Visit (INDEPENDENT_AMBULATORY_CARE_PROVIDER_SITE_OTHER): Payer: Medicare Other | Admitting: Physician Assistant

## 2022-08-10 VITALS — BP 131/83 | HR 115 | Ht 73.0 in | Wt 215.0 lb

## 2022-08-10 DIAGNOSIS — R29898 Other symptoms and signs involving the musculoskeletal system: Secondary | ICD-10-CM | POA: Diagnosis not present

## 2022-08-10 DIAGNOSIS — M5416 Radiculopathy, lumbar region: Secondary | ICD-10-CM | POA: Diagnosis not present

## 2022-08-10 DIAGNOSIS — M5136 Other intervertebral disc degeneration, lumbar region: Secondary | ICD-10-CM

## 2022-08-10 MED ORDER — GABAPENTIN 100 MG PO CAPS: ORAL_CAPSULE | ORAL | 0 refills | Status: DC

## 2022-08-10 MED ORDER — MELOXICAM 15 MG PO TABS: 15.0000 mg | ORAL_TABLET | Freq: Every day | ORAL | 2 refills | Status: DC

## 2022-08-10 NOTE — Patient Instructions (Addendum)
Ordered MRI Start PT Start mobic daily and gabapentin  Follow up with Dr. Karie Schwalbe after MRI.

## 2022-08-10 NOTE — Progress Notes (Signed)
Acute Office Visit  Subjective:     Patient ID: Harold Edwards, male    DOB: 11/07/1955, 66 y.o.   MRN: [id number]  Chief Complaint  Patient presents with   Back Pain    HPI Patient is in today because low back pain with right sided radiculopathy is not improving. He was seen on 08/04/2022 and given prednisone and a muscle relaxer, Flexeril.  He has a history of low back pain and flares due to lumbar degenerative disc disease.  This episode caused by picking up a heavy cabinet seems to not be resolving but worsening.  Pain continues to radiate down right side and into anterior thigh but stops at the knee.  He does feel like strength in his right leg has decreased.  He denies any saddle anesthesia or bowel or bladder dysfunction. He has finished prednisone and rates pain today 12/10.   .. Active Ambulatory Problems    Diagnosis Date Noted   Chronic cough 09/19/2017   ED (erectile dysfunction) 04/10/2014   Medication side effects 09/13/2017   Mixed hyperlipidemia 09/19/2017   History of prostate cancer 02/21/2014   S/P prostatectomy 03/18/2014   Stress incontinence 04/10/2014   Sinus tachycardia 09/13/2017   Lumbar back pain with radiculopathy affecting right lower extremity 04/14/2018   Former smoker, stopped smoking in distant past 04/20/2018   Senile purpura (HCC) 10/31/2018   AMS (altered mental status) 04/28/2020   Gastroenteritis 04/28/2020   Aortic atherosclerosis (HCC) 04/28/2020   Acute metabolic encephalopathy    Hypokalemia    Ventral hernia without obstruction or gangrene 11/16/2020   Lipoma of torso 11/16/2020   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     Objective:    BP 131/83 (BP Location: Left Arm, Patient Position: Sitting, Cuff Size: Large)   Pulse (!) 115   Ht 6\' 1"  (1.854 m)   Wt 215 lb (97.5 kg)   SpO2 97%   BMI 28.37 kg/m  BP Readings from Last 3 Encounters:  08/10/22 131/83  08/04/22 130/81  11/24/21 134/75   Wt Readings from Last 3 Encounters:  08/10/22 215 lb (97.5 kg)  08/04/22 213 lb (96.6 kg)  11/24/21 213 lb (96.6 kg)      Physical Exam Constitutional:      Comments: In pain  HENT:     Head: Normocephalic.  Cardiovascular:     Rate and Rhythm: Normal rate.  Musculoskeletal:     Comments: Very little movement at waist due to pain Positive SLR to the right 3/5 strength right lower leg No tenderness to palpation over lumbar spine Tender and tight right sided paraspinal muscles to palpation  Neurological:     Mental Status: He is alert.          Assessment & Plan:  [name] KitchenMarland KitchenYohaan was seen today for back pain.  Diagnoses and all orders for this visit:  Lumbar back pain with radiculopathy affecting right lower extremity -  MR Lumbar Spine Wo Contrast; Future -     Ambulatory referral to Physical Therapy -     meloxicam (MOBIC) 15 MG tablet; Take 1 tablet (15 mg total) by mouth daily. -     gabapentin (NEURONTIN) 100 MG capsule; One tab PO qHS for a week, then BID for a week, then TID.  DDD (degenerative disc disease), lumbar -     MR Lumbar Spine Wo Contrast; Future -     Ambulatory referral to Physical Therapy -     meloxicam (MOBIC) 15 MG tablet; Take 1 tablet (15 mg total) by mouth daily. -     gabapentin (NEURONTIN) 100 MG capsule; One tab PO qHS for a week, then BID for a week, then TID.  Right leg weakness   Patient is having worsening radiculopathy due to degenerative disc disease flare. Pt has completed  prednisone burst. MRI was ordered due to worsening symptoms and patient's history of lumbar DDD. Concerned for herniation.  He does have some right-sided leg weakness.  Referral to physical therapy to get started was ordered.  Start Mobic once a day and gabapentin tapering up.  Continue to use Flexeril as needed.  Consider TENS unit, heat therapy, IcyHot patches and massage.  Follow-up with Dr. Karie Schwalbe or sports medicine provider after MRI.  Tandy Gaw, PA-C

## 2022-08-13 ENCOUNTER — Encounter: Payer: Self-pay | Admitting: Physician Assistant

## 2022-08-15 ENCOUNTER — Ambulatory Visit (INDEPENDENT_AMBULATORY_CARE_PROVIDER_SITE_OTHER): Payer: Medicare Other

## 2022-08-15 ENCOUNTER — Encounter: Payer: Self-pay | Admitting: Physician Assistant

## 2022-08-15 DIAGNOSIS — M5416 Radiculopathy, lumbar region: Secondary | ICD-10-CM

## 2022-08-15 DIAGNOSIS — M5116 Intervertebral disc disorders with radiculopathy, lumbar region: Secondary | ICD-10-CM | POA: Diagnosis not present

## 2022-08-15 DIAGNOSIS — M5136 Other intervertebral disc degeneration, lumbar region: Secondary | ICD-10-CM | POA: Diagnosis not present

## 2022-08-15 DIAGNOSIS — M545 Low back pain, unspecified: Secondary | ICD-10-CM | POA: Diagnosis not present

## 2022-08-15 DIAGNOSIS — M48061 Spinal stenosis, lumbar region without neurogenic claudication: Secondary | ICD-10-CM | POA: Diagnosis not present

## 2022-08-16 ENCOUNTER — Encounter: Payer: Self-pay | Admitting: Physician Assistant

## 2022-08-16 MED ORDER — TRAMADOL HCL 50 MG PO TABS: 50.0000 mg | ORAL_TABLET | Freq: Three times a day (TID) | ORAL | 0 refills | Status: AC | PRN

## 2022-08-16 MED ORDER — CYCLOBENZAPRINE HCL 10 MG PO TABS: 10.0000 mg | ORAL_TABLET | Freq: Three times a day (TID) | ORAL | 0 refills | Status: DC | PRN

## 2022-08-16 NOTE — Telephone Encounter (Signed)
See separate phone note and get a close this 1 is there are multiple notes open and regular this regard.  The tramadol was sent.

## 2022-08-16 NOTE — Telephone Encounter (Signed)
Spouse called.  Will he be able to get refill on the Tramadol?

## 2022-08-16 NOTE — Telephone Encounter (Signed)
Meds ordered this encounter  Medications   cyclobenzaprine (FLEXERIL) 10 MG tablet    Sig: Take 1 tablet (10 mg total) by mouth 3 (three) times daily as needed for muscle spasms.    Dispense:  30 tablet    Refill:  0   traMADol (ULTRAM) 50 MG tablet    Sig: Take 1 tablet (50 mg total) by mouth every 8 (eight) hours as needed for up to 5 days.    Dispense:  15 tablet    Refill:  0

## 2022-08-16 NOTE — Telephone Encounter (Signed)
Will address  when MRI comes back.

## 2022-08-20 ENCOUNTER — Encounter: Payer: Self-pay | Admitting: Physician Assistant

## 2022-08-23 NOTE — Progress Notes (Signed)
Certainly have a lot of reason for back pain. Lots of degenerative changes in lumbar spine. You do have known disc collapse and some tears. How are you symptoms? Are they letting up at all? I would like you to see neurosurgery for consult. Are you ok with this?

## 2022-08-24 ENCOUNTER — Encounter: Payer: Self-pay | Admitting: Physician Assistant

## 2022-08-24 ENCOUNTER — Ambulatory Visit (INDEPENDENT_AMBULATORY_CARE_PROVIDER_SITE_OTHER): Payer: Medicare Other | Admitting: Physician Assistant

## 2022-08-24 VITALS — BP 125/72 | HR 121 | Resp 17 | Ht 73.0 in | Wt 215.0 lb

## 2022-08-24 DIAGNOSIS — M5416 Radiculopathy, lumbar region: Secondary | ICD-10-CM

## 2022-08-24 DIAGNOSIS — M5126 Other intervertebral disc displacement, lumbar region: Secondary | ICD-10-CM | POA: Insufficient documentation

## 2022-08-24 DIAGNOSIS — M5136 Other intervertebral disc degeneration, lumbar region: Secondary | ICD-10-CM

## 2022-08-24 MED ORDER — GABAPENTIN 300 MG PO CAPS: ORAL_CAPSULE | ORAL | 0 refills | Status: DC

## 2022-08-24 MED ORDER — TRAMADOL HCL 50 MG PO TABS: 50.0000 mg | ORAL_TABLET | Freq: Four times a day (QID) | ORAL | 0 refills | Status: AC | PRN

## 2022-08-24 NOTE — Progress Notes (Signed)
Established Patient Office Visit  Subjective   Patient ID: Harold Edwards, male    DOB: 1955/10/30  Age: 68 y.o. MRN: [id number]  Chief Complaint  Patient presents with   MRI discussion    Patient states he is in constant pain.It runs from right side of back to head and legs.Lower back pain level 10.    HPI Pt is a 67 yo male with chronic low back pain who presents to the clinic to discuss MRI results. He remains in 10/10 pain today. Tylenol and mobic not helping. Prednisone helped some. He has known lumbar DDD. He saw Dr. Karie Edwards in 2021 for him to order epidural injection. He declined surgical consult in 2021.    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 08/24/2022   Right leg weakness 08/10/2022   DDD (degenerative disc disease), lumbar 08/04/2022   Plantar fasciitis of left foot 11/24/2021   Pneumonia due to infectious organism 11/05/2021   Wheezing 11/05/2021   Ventral hernia without obstruction or gangrene 11/16/2020   Lipoma of torso 11/16/2020   Irritable bowel syndrome with both constipation and diarrhea 11/16/2020   Acute metabolic encephalopathy    Hypokalemia    AMS (altered mental status) 04/28/2020   Gastroenteritis 04/28/2020   Aortic atherosclerosis (HCC) 04/28/2020   Senile purpura (HCC) 10/31/2018   Former smoker, stopped smoking in distant past 04/20/2018   Lumbar back pain with radiculopathy affecting right lower extremity 04/14/2018   Chronic cough 09/19/2017   Mixed hyperlipidemia 09/19/2017   Medication side effects 09/13/2017   Sinus tachycardia 09/13/2017   ED (erectile dysfunction) 04/10/2014   Stress incontinence 04/10/2014   S/P prostatectomy 03/18/2014   History of prostate cancer 02/21/2014   Past Medical History:  Diagnosis Date   Back pain, chronic    Cancer (HCC)    Chronic cough 09/19/2017   Influenza    Pneumonia    Prostate cancer (HCC) 02/21/2014   4+4=8 s/p TRUS biopsy by Dr.  Edwyna Edwards on 01/18/14  Last Assessment & Plan:   Patient be managed by urology.  Patient is status post prostatectomy and radiation therapy.  Denies hematuria.   Sinus tachycardia 09/13/2017   Stress incontinence 04/10/2014   Past Surgical History:  Procedure Laterality Date   ABDOMINAL SURGERY     PROSTATE SURGERY     Prostatectomy robot. Dr Harold Edwards and Dr Harold Edwards   TONSILLECTOMY     Family History  Problem Relation Age of Onset   Breast cancer Other    Diabetes Other    Cancer Mother    Diabetes Mother    Cancer Father    Diabetes Father    Allergies  Allergen Reactions   Amoxicillin Diarrhea    Stomach cramps      ROS See HPI.    Objective:     BP 125/72 (BP Location: Left Arm, Patient Position: Sitting, Cuff Size: Large)   Pulse (!) 121   Resp 17   Ht 6\' 1"  (1.854 m)   Wt 215 lb (97.5 kg)   SpO2 94%   BMI 28.37 kg/m  BP Readings from Last 3 Encounters:  08/24/22 125/72  08/10/22 131/83  08/04/22 130/81   Wt Readings from Last 3 Encounters:  08/24/22 215 lb (97.5 kg)  08/10/22 215 lb (97.5 kg)  08/04/22 213 lb (96.6 kg)      Physical Exam Constitutional:      Comments: Pacing in pain  Cardiovascular:  Rate and Rhythm: Normal rate.  Pulmonary:     Effort: Pulmonary effort is normal.  Musculoskeletal:     Right lower leg: No edema.     Left lower leg: No edema.     Comments: Decreased ROM at waist due to pain 4/5 lower extremity strength Pain with right SLR  Neurological:     General: No focal deficit present.     Mental Status: He is alert and oriented to person, place, and time.  Psychiatric:        Mood and Affect: Mood normal.      The 10-year ASCVD risk score (Arnett DK, et al., 2019) is: 11.2%    Assessment & Plan:  [name] KitchenMarland KitchenFoch was seen today for mri discussion.  Diagnoses and all orders for this visit:  Lumbar back pain with radiculopathy affecting right lower extremity -     traMADol (ULTRAM) 50 MG tablet; Take 1 tablet (50 mg total)  by mouth every 6 (six) hours as needed for up to 5 days. -     gabapentin (NEURONTIN) 300 MG capsule; One tab PO qHS for a week, then BID for a week, then TID. 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 -     gabapentin (NEURONTIN) 300 MG capsule; One tab PO qHS for a week, then BID for a week, then TID. -     Ambulatory referral to Neurosurgery  DDD (degenerative disc disease), lumbar -     traMADol (ULTRAM) 50 MG tablet; Take 1 tablet (50 mg total) by mouth every 6 (six) hours as needed for up to 5 days. -     gabapentin (NEURONTIN) 300 MG capsule; One tab PO qHS for a week, then BID for a week, then TID. -     Ambulatory referral to Neurosurgery   Discussed MRI results IMPRESSION: 1. Symptomatic finding is likely at L2-3 where there is a right paracentral to foraminal extrusion with upward migration. Pronounced right L2 impingement. 2. Disc and facet degeneration at L3-4 and below is similar to 2020 with mild to moderate foraminal narrowing bilaterally at L4-5   Pt is moderate to severe pain, added tramadol for break through Impingement is radicular in the L2/L3 nerve dermatome .[name] KitchenPDMP reviewed during this encounter. No concerns Increased gabapentin to 300mg  up to three times a day Continue with mobic daily Get in with Dr. Karie Edwards to order another epidural injection, completed 3. Consult with neurosurgery, referral placed   Return for Dr. Karie Edwards consult ASAP for epidural injection.    Tandy Gaw, PA-C

## 2022-08-24 NOTE — Patient Instructions (Signed)
Herniated Disk  A herniated disk, also called a ruptured disk or slipped disk, occurs when a disk in the spine bulges out too far. Between the bones in the spine (vertebrae), there are oval disks that are made of a soft, spongy center filled with liquid that is surrounded by a tough outer ring. The disks connect the vertebrae, help the spine move, and keep the bones from rubbing against each other when you move. When you have a herniated disk, the spongy center of the disk bulges out or breaks through the outer ring. The spongy center can press on a nerve between the vertebrae and cause pain. This can occur anywhere in the back or neck area, but the lower back is most commonly affected. What are the causes? This condition may be caused by: Age-related wear and tear. Sudden injury, such as a strain or sprain. What increases the risk? The following factors may make you more likely to develop this condition: Aging. This is the main risk factor for a herniated disk. Being a man who is 55-89 years old. Frequently doing activities that involve heavy lifting, bending, or twisting. Not getting enough exercise. Being overweight. Using tobacco products. What are the signs or symptoms? Symptoms may vary depending on where your herniated disk is located. A herniated disk in the lower back may cause: Sharp pain in part of the hips, buttocks, or legs. Sharp pain in the lower back, spreading down through the leg into the foot (sciatica). A herniated disk in the neck may cause dizziness and vertigo. It may also cause pain or weakness in the neck, shoulder, or upper arm, forearm, or fingers. You may also have muscle weakness. You may have trouble: Lifting your leg or arm. Standing on your toes. Squeezing tightly with one of your hands. Other symptoms may include: Numbness or tingling in the affected areas of the hands, arms, feet, or legs. Inability to control when you urinate or when you have bowel  movements. This is a rare but serious sign of a severe herniated disk in the lower back. How is this diagnosed? This condition may be diagnosed based on: Your symptoms. Your medical history. A physical exam. The exam may include: A straight-leg test. For this test, you will lie on your back while your health care provider lifts your leg, keeping your knee straight. If you feel pain, you likely have a herniated disk. Neurologic tests. These include checking for numbness, reflexes, muscle strength, and problems with posture. Imaging tests, such as: X-rays. MRI. CT scan. Electromyogram (EMG) to check the nerves that control muscles. How is this treated? Treatment for this condition may include: A period of light, painless activity for a few days to several weeks. Complete bed rest is not recommended. If you have a herniated disk in your lower back, avoid sitting as it increases pressure on the disk. Medicines. These may include: NSAIDs, such as ibuprofen, to help reduce pain and swelling. Muscle relaxants to prevent sudden tightening of the back muscles (back spasms). Prescription pain medicines, if you have severe pain. Ice or heat therapy. Steroid injections in the area of the herniated disk. These can help reduce pain and swelling. Physical therapy to strengthen your back muscles. In many cases, symptoms go away with treatment over a period of days or weeks. You will most likely be free of symptoms after 3-4 months. If other treatments do not help to relieve your symptoms, you may need surgery. Follow these instructions at home: Medicines Take  over-the-counter and prescription medicines only as told by your health care provider. Ask your health care provider if the medicine prescribed to you: Requires you to avoid driving or using heavy machinery. Can cause constipation. You may need to take these actions to prevent or treat constipation: Drink enough fluid to keep your urine pale  yellow. Take over-the-counter or prescription medicines. Eat foods that are high in fiber, such as beans, whole grains, and fresh fruits and vegetables. Limit foods that are high in fat and processed sugars, such as fried or sweet foods. Managing pain, stiffness, and swelling     If directed, put ice on the painful area. Icing can help to relieve pain. To do this: Put ice in a plastic bag. Place a towel between your skin and the bag. Leave the ice on for 20 minutes, 2-3 times a day. Remove the ice if your skin turns bright red. This is very important. If you cannot feel pain, heat, or cold, you have a greater risk of damage to the area. If directed, apply heat to the painful area as often as told by your health care provider. Heat can reduce the stiffness of your muscles. Use the heat source that your health care provider recommends, such as a moist heat pack or a heating pad. Place a towel between your skin and the heat source. Leave the heat on for 20-30 minutes. Remove the heat if your skin turns bright red. This is especially important if you are unable to feel pain, heat, or cold. You may have a greater risk of getting burned. Activity Avoid strict bed rest but only do activities that are painless. Gradually return to your normal activities and exercise as told by your health care provider. Ask your health care provider what activities and exercises are safe for you. Use good posture. Avoid movements that cause pain. Do not lift anything that is heavier than 10 lb (4.5 kg), or the limit that you are told, until your health care provider says that it is safe. Do not sit or stand for long periods of time without changing positions. Do not sit for long periods of time without getting up and moving around. If physical therapy was prescribed, do exercises as told by your health care provider. Aim to strengthen muscles in your back and abdomen with exercises such as swimming or  walking. General instructions Do not use any products that contain nicotine or tobacco, such as cigarettes, e-cigarettes, and chewing tobacco. These products can delay healing. If you need help quitting, ask your health care provider. Do not wear high-heeled shoes. Do not sleep on your abdomen. If you are overweight, work with your health care provider to lose weight safely. Keep all follow-up visits. This is important. How is this prevented? Maintain a healthy weight. Maintain physical fitness. Do at least 150 minutes of moderate-intensity exercise each week, such as brisk walking or water aerobics. When lifting objects: Keep your feet at least shoulder-width apart and tighten the muscles of your abdomen. Keep your spine neutral as you bend your knees and hips. It is important to lift using the strength of your legs, not your back. Do not lock your knees straight out. Always ask for help to lift heavy or awkward objects. Contact a health care provider if you: Have back pain or neck pain that does not get better after 6 weeks. Have severe pain in your back, neck, legs, or arms. Develop numbness, tingling, or weakness anywhere in your body.  Get help right away if: You cannot move your arms or legs. You cannot control when you urinate or have bowel movements. You feel dizzy or you faint. You have shortness of breath. These symptoms may represent a serious problem that is an emergency. Do not wait to see if the symptoms will go away. Get medical help right away. Call your local emergency services (911 in the [place] Do not drive yourself to the hospital. Summary A herniated disk, also called a ruptured disk or slipped disk, occurs when a disk in the spine bulges out too far (herniates). This condition may be caused by age-related wear and tear or a sudden injury. Symptoms may vary depending on where your herniated disk is located. Treatment may include rest, medicines, ice or heat therapy,  steroid injections, and physical therapy. If other treatments do not help to relieve your symptoms, you may need surgery. This information is not intended to replace advice given to you by your health care provider. Make sure you discuss any questions you have with your health care provider. Document Revised: 05/16/2019 Document Reviewed: 05/16/2019 Elsevier Patient Education  2024 [employer].

## 2022-08-27 ENCOUNTER — Encounter: Payer: Self-pay | Admitting: Physician Assistant

## 2022-08-31 NOTE — Telephone Encounter (Signed)
I do not know either. Maybe call him to get clarification.

## 2022-08-31 NOTE — Telephone Encounter (Signed)
Harold Edwards is also confused. He didn't send the message his wife sent the message. He will ask his wife and call back.

## 2022-09-02 ENCOUNTER — Telehealth: Payer: Self-pay | Admitting: Physician Assistant

## 2022-09-02 DIAGNOSIS — M5416 Radiculopathy, lumbar region: Secondary | ICD-10-CM

## 2022-09-02 DIAGNOSIS — M5136 Other intervertebral disc degeneration, lumbar region: Secondary | ICD-10-CM

## 2022-09-02 DIAGNOSIS — R29898 Other symptoms and signs involving the musculoskeletal system: Secondary | ICD-10-CM

## 2022-09-02 NOTE — Telephone Encounter (Signed)
Pt came in to inquire about a referral for a second opinion. He has a referral for [facility] and has scheduled an appt for 08/08. He would still like to see Dr.Daniel Couture for a second opinion.

## 2022-09-02 NOTE — Telephone Encounter (Signed)
Received incoming call from [facility] medicare Rep, requesting referral be sent to Neurosurgeon. Explained to representative that referral has been faxed to [facility]. Letter was sent to patient with Referral Contact info.

## 2022-09-03 NOTE — Telephone Encounter (Signed)
Referral, clinical notes, imaging result report and copies of insurance cards have been faxed to [facility] at [phone]. Office will contact patient to schedule referral appointment.

## 2022-09-13 ENCOUNTER — Encounter: Payer: Self-pay | Admitting: Physician Assistant

## 2022-09-21 ENCOUNTER — Other Ambulatory Visit: Payer: Self-pay | Admitting: Physician Assistant

## 2022-09-21 DIAGNOSIS — M5136 Other intervertebral disc degeneration, lumbar region: Secondary | ICD-10-CM

## 2022-09-21 DIAGNOSIS — M5416 Radiculopathy, lumbar region: Secondary | ICD-10-CM

## 2022-09-21 DIAGNOSIS — M5126 Other intervertebral disc displacement, lumbar region: Secondary | ICD-10-CM | POA: Diagnosis not present

## 2022-10-07 DIAGNOSIS — M5416 Radiculopathy, lumbar region: Secondary | ICD-10-CM | POA: Diagnosis not present

## 2022-10-17 ENCOUNTER — Other Ambulatory Visit: Payer: Self-pay | Admitting: Physician Assistant

## 2022-10-17 DIAGNOSIS — M5136 Other intervertebral disc degeneration, lumbar region: Secondary | ICD-10-CM

## 2022-10-17 DIAGNOSIS — M5416 Radiculopathy, lumbar region: Secondary | ICD-10-CM

## 2022-10-17 DIAGNOSIS — M5126 Other intervertebral disc displacement, lumbar region: Secondary | ICD-10-CM

## 2022-12-16 ENCOUNTER — Telehealth: Payer: Self-pay | Admitting: Physician Assistant

## 2022-12-16 DIAGNOSIS — M5416 Radiculopathy, lumbar region: Secondary | ICD-10-CM

## 2022-12-16 DIAGNOSIS — R29898 Other symptoms and signs involving the musculoskeletal system: Secondary | ICD-10-CM

## 2022-12-16 DIAGNOSIS — M5126 Other intervertebral disc displacement, lumbar region: Secondary | ICD-10-CM

## 2022-12-16 NOTE — Telephone Encounter (Signed)
Pt is requesting referral to [facility] for injections. Please advise.

## 2022-12-20 NOTE — Telephone Encounter (Signed)
Referral, clinical notes, Imaging results, demographics and copies of insurance cards have been faxed to [facility]- at [phone]. Office will contact patient to schedule referral appointment.

## 2022-12-20 NOTE — Telephone Encounter (Signed)
Referral placed.

## 2022-12-21 ENCOUNTER — Encounter: Payer: Self-pay | Admitting: Physician Assistant

## 2022-12-22 MED ORDER — TRAMADOL HCL 50 MG PO TABS: 50.0000 mg | ORAL_TABLET | Freq: Four times a day (QID) | ORAL | 0 refills | Status: AC | PRN

## 2022-12-22 NOTE — Telephone Encounter (Signed)
..  PDMP reviewed during this encounter. No concerns.  Pt has neurosurgery appt scheduled for Dec 5th.  Sent #20 Tramadol.

## 2023-01-10 DIAGNOSIS — M5416 Radiculopathy, lumbar region: Secondary | ICD-10-CM | POA: Diagnosis not present

## 2023-01-13 DIAGNOSIS — M5416 Radiculopathy, lumbar region: Secondary | ICD-10-CM | POA: Diagnosis not present

## 2023-01-25 ENCOUNTER — Ambulatory Visit (INDEPENDENT_AMBULATORY_CARE_PROVIDER_SITE_OTHER): Payer: Medicare Other | Admitting: Physician Assistant

## 2023-01-25 ENCOUNTER — Encounter: Payer: Self-pay | Admitting: Physician Assistant

## 2023-01-25 VITALS — BP 126/88 | HR 95 | Resp 12 | Ht 73.0 in | Wt 221.4 lb

## 2023-01-25 DIAGNOSIS — Z9079 Acquired absence of other genital organ(s): Secondary | ICD-10-CM

## 2023-01-25 DIAGNOSIS — M5416 Radiculopathy, lumbar region: Secondary | ICD-10-CM | POA: Diagnosis not present

## 2023-01-25 DIAGNOSIS — Z0000 Encounter for general adult medical examination without abnormal findings: Secondary | ICD-10-CM | POA: Diagnosis not present

## 2023-01-25 DIAGNOSIS — Z23 Encounter for immunization: Secondary | ICD-10-CM

## 2023-01-25 DIAGNOSIS — E782 Mixed hyperlipidemia: Secondary | ICD-10-CM | POA: Diagnosis not present

## 2023-01-25 DIAGNOSIS — R053 Chronic cough: Secondary | ICD-10-CM | POA: Diagnosis not present

## 2023-01-25 DIAGNOSIS — Z131 Encounter for screening for diabetes mellitus: Secondary | ICD-10-CM | POA: Diagnosis not present

## 2023-01-25 NOTE — Progress Notes (Signed)
Annual Wellness Visit     Patient: Harold Edwards, Male    DOB: 1955/10/01, 67 y.o.   MRN: [id number]  Subjective  Chief Complaint  Patient presents with   Annual Exam    Harold Edwards is a 67 y.o. male who presents today for his Annual Wellness Visit. He reports consuming a general diet. The patient does not participate in regular exercise at present. He generally feels fairly well. He reports sleeping well. He does have additional problems to discuss today.   HPI  He would like refill of tramadol for lumbar radiculopathy pain.   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 08/24/2022   Right leg weakness 08/10/2022   DDD (degenerative disc disease), lumbar 08/04/2022   Plantar fasciitis of left foot 11/24/2021   Pneumonia due to infectious organism 11/05/2021   Wheezing 11/05/2021   Ventral hernia without obstruction or gangrene 11/16/2020   Lipoma of torso 11/16/2020   Irritable bowel syndrome with both constipation and diarrhea 11/16/2020   Acute metabolic encephalopathy    Hypokalemia    AMS (altered mental status) 04/28/2020   Gastroenteritis 04/28/2020   Aortic atherosclerosis (HCC) 04/28/2020   Senile purpura (HCC) 10/31/2018   Former smoker, stopped smoking in distant past 04/20/2018   Lumbar back pain with radiculopathy affecting right lower extremity 04/14/2018   Chronic cough 09/19/2017   Mixed hyperlipidemia 09/19/2017   Medication side effects 09/13/2017   Sinus tachycardia 09/13/2017   ED (erectile dysfunction) 04/10/2014   Stress incontinence 04/10/2014   S/P prostatectomy 03/18/2014   History of prostate cancer 02/21/2014   Past Medical History:  Diagnosis Date   Back pain, chronic    Cancer (HCC)    Chronic cough 09/19/2017   Influenza    Pneumonia    Prostate cancer (HCC) 02/21/2014    4+4=8 s/p TRUS biopsy by Dr. Edwyna Shell on 01/18/14  Last Assessment & Plan:   Patient be managed by urology.  Patient is status post prostatectomy and radiation therapy.  Denies hematuria.   Sinus tachycardia 09/13/2017   Stress incontinence 04/10/2014   Past Surgical History:  Procedure Laterality Date   ABDOMINAL SURGERY     PROSTATE SURGERY     Prostatectomy robot. Dr Zachery Conch and Dr Andrey Campanile   TONSILLECTOMY     Family History  Problem Relation Age of Onset   Breast cancer Other    Diabetes Other    Cancer Mother    Diabetes Mother    Cancer Father    Diabetes Father    Allergies  Allergen Reactions   Amoxicillin Diarrhea    Stomach cramps       Medications: Outpatient Medications Prior to Visit  Medication Sig   cyclobenzaprine (FLEXERIL) 10 MG tablet Take 1 tablet (10 mg total) by mouth 3 (three) times daily as needed for muscle spasms.   gabapentin (NEURONTIN) 300 MG capsule TAKE 1 CAPSULE BY MOUTH EVERY NIGHT AT BEDTIME FOR 7 DAYS, THEN 1 CAPSULE 2 TIMES A DAY FOR 7 DAYS, AND THEN 1 CAPSULE 3 TIMES A DAY THEREAFTER   gabapentin (NEURONTIN) 300 MG capsule Take 300 mg by mouth.   meloxicam (MOBIC) 15 MG tablet Take 1 tablet (15 mg total) by mouth daily.   traMADol (ULTRAM) 50 MG tablet Take 50 mg by mouth every 6 (six) hours as needed.   No facility-administered medications  prior to visit.    Allergies  Allergen Reactions   Amoxicillin Diarrhea    Stomach cramps    Patient Care Team: Nolene Ebbs as PCP - General (Family Medicine) Melene Plan, MD as Consulting Physician (Urology)  Review of Systems  Constitutional: Negative.   HENT: Negative.    Eyes: Negative.   Respiratory: Negative.    Cardiovascular: Negative.   Gastrointestinal: Negative.   Genitourinary: Negative.   Musculoskeletal:  Positive for back pain and joint pain. Negative for falls.  Skin: Negative.   Neurological: Negative.   Endo/Heme/Allergies: Negative.    Psychiatric/Behavioral: Negative.         Objective  BP 126/88 (BP Location: Left Arm, Patient Position: Sitting, Cuff Size: Large)   Pulse 95   Resp 12   Ht 6\' 1"  (1.854 m)   Wt 221 lb 6.4 oz (100.4 kg)   SpO2 98%   BMI 29.21 kg/m  BP Readings from Last 3 Encounters:  01/25/23 126/88  08/24/22 125/72  08/10/22 131/83   Wt Readings from Last 3 Encounters:  01/25/23 221 lb 6.4 oz (100.4 kg)  08/24/22 215 lb (97.5 kg)  08/10/22 215 lb (97.5 kg)      Physical Exam BP 126/88 (BP Location: Left Arm, Patient Position: Sitting, Cuff Size: Large)   Pulse 95   Resp 12   Ht 6\' 1"  (1.854 m)   Wt 221 lb 6.4 oz (100.4 kg)   SpO2 98%   BMI 29.21 kg/m   General Appearance:    Alert, cooperative, no distress, appears stated age  Head:    Normocephalic, without obvious abnormality, atraumatic  Eyes:    PERRL, conjunctiva/corneas clear, EOM's intact, fundi    benign, both eyes       Ears:    Normal TM's and external ear canals, both ears  Nose:   Nares normal, septum midline, mucosa normal, no drainage    or sinus tenderness  Throat:   Lips, mucosa, and tongue normal; teeth and gums normal  Neck:   Supple, symmetrical, trachea midline, no adenopathy;       thyroid:  No enlargement/tenderness/nodules; no carotid   bruit or JVD  Back:     Symmetric, no curvature, ROM normal, no CVA tenderness  Lungs:     Clear to auscultation bilaterally, respirations unlabored  Chest wall:    No tenderness or deformity  Heart:    Regular rate and rhythm, S1 and S2 normal, no murmur, rub   or gallop  Abdomen:     Soft, non-tender, bowel sounds active all four quadrants,    no masses, no organomegaly        Extremities:   Extremities normal, atraumatic, no cyanosis or edema  Pulses:   2+ and symmetric all extremities  Skin:   Skin color, texture, turgor normal, no rashes or lesions  Lymph nodes:   Cervical, supraclavicular, and axillary nodes normal  Neurologic:   CNII-XII intact. Normal  strength, sensation and reflexes      throughout     Most recent functional status assessment:    01/25/2023    9:03 AM  In your present state of health, do you have any difficulty performing the following activities:  Hearing? 0  Vision? 0  Difficulty concentrating or making decisions? 0  Walking or climbing stairs? 0  Dressing or bathing? 0  Doing errands, shopping? 0   Most recent fall risk assessment:    01/22/2023   10:43 AM  Fall Risk  Falls in the past year? 0   Injury with Fall? 0      Patient-reported    Most recent depression screenings:    01/25/2023    9:09 AM 08/24/2022    2:35 PM  PHQ 2/9 Scores  PHQ - 2 Score 0 0  PHQ- 9 Score 0    Most recent cognitive screening:    01/25/2023    9:05 AM  6CIT Screen  What Year? 0 points  What month? 0 points  What time? 0 points  Count back from 20 0 points  Months in reverse 0 points  Repeat phrase 0 points  Total Score 0 points   Most recent Audit-C alcohol use screening    01/22/2023   10:43 AM  Alcohol Use Disorder Test (AUDIT)  1. How often do you have a drink containing alcohol? 0   3. How often do you have six or more drinks on one occasion? 0      Patient-reported   A score of 3 or more in women, and 4 or more in men indicates increased risk for alcohol abuse, EXCEPT if all of the points are from question 1       Assessment & Plan   Annual wellness visit done today including the all of the following: Reviewed patient's Family Medical History Reviewed and updated list of patient's medical providers Assessment of cognitive impairment was done Assessed patient's functional ability Established a written schedule for health screening services Health Risk Assessent Completed and Reviewed  Exercise Activities and Dietary recommendations  GOALS  Move more after surgery in January.    Immunization History  Administered Date(s) Administered   Influenza Inj Mdck Quad Pf 12/04/2017    Influenza,inj,Quad PF,6+ Mos 10/31/2018   Influenza-Unspecified 11/10/2020   Pneumococcal Conjugate-13 12/11/2016   Pneumococcal Polysaccharide-23 10/31/2018    Health Maintenance  Topic Date Due   DTaP/Tdap/Td (1 - Tdap) Never done   INFLUENZA VACCINE  09/09/2022   Colonoscopy  08/04/2023 (Originally 10/02/2001)   Pneumonia Vaccine 78+ Years old (3 of 3 - PPSV23 or PCV20) 10/31/2023   Medicare Annual Wellness (AWV)  01/25/2024   Hepatitis C Screening  Completed   HPV VACCINES  Aged Out   COVID-19 Vaccine  Discontinued   Zoster Vaccines- Shingrix  Discontinued     Discussed health benefits of physical activity, and encouraged him to engage in regular exercise appropriate for his age and condition.    [name] KitchenMolly Maduro was seen today for annual exam.  Diagnoses and all orders for this visit:  Medicare annual wellness visit, initial -     Flu vaccine trivalent PF, 6mos and older(Flulaval,Afluria,Fluarix,Fluzone) -     Pneumococcal conjugate vaccine 20-valent (Prevnar 20) -     Lipid panel -     CMP14+EGFR -     CBC w/Diff/Platelet -     PSA  Preventative health care -     Lipid panel -     CMP14+EGFR -     CBC w/Diff/Platelet -     PSA  Chronic cough -     CBC w/Diff/Platelet -     DG Chest 2 View; Future  Mixed hyperlipidemia -     Lipid panel  S/P prostatectomy -     PSA  Screening for diabetes mellitus -     CMP14+EGFR   .[name] KitchenStart a regular exercise program and make sure you are eating a healthy diet Try to eat 4 servings of dairy a day or take a calcium supplement (  500mg  twice a day). Vitals look good on 2nd recheck.  Fasting labs ordered today Pneumonia and flu shot given today.  Will get Tdap at pharmacy.  Declined shingles vaccine and colonoscopy and cologuard.  Pt aware of risk.  PSA screening ordered today  Pt has surgery with Dr. Yetta Barre in neurosurgery in Jan 2025 for lumbar radiculopathy. Recommended him to get tramadol refill through Dr. Yetta Barre until after  surgery.      Return in about 1 year (around 01/25/2024).     Tandy Gaw, PA-C

## 2023-01-25 NOTE — Patient Instructions (Addendum)
Health Maintenance After Age 65 After age 66, you are at a higher risk for certain long-term diseases and infections as well as injuries from falls. Falls are a major cause of broken bones and head injuries in people who are older than age 66. Getting regular preventive care can help to keep you healthy and well. Preventive care includes getting regular testing and making lifestyle changes as recommended by your health care provider. Talk with your health care provider about: Which screenings and tests you should have. A screening is a test that checks for a disease when you have no symptoms. A diet and exercise plan that is right for you. What should I know about screenings and tests to prevent falls? Screening and testing are the best ways to find a health problem early. Early diagnosis and treatment give you the best chance of managing medical conditions that are common after age 66. Certain conditions and lifestyle choices may make you more likely to have a fall. Your health care provider may recommend: Regular vision checks. Poor vision and conditions such as cataracts can make you more likely to have a fall. If you wear glasses, make sure to get your prescription updated if your vision changes. Medicine review. Work with your health care provider to regularly review all of the medicines you are taking, including over-the-counter medicines. Ask your health care provider about any side effects that may make you more likely to have a fall. Tell your health care provider if any medicines that you take make you feel dizzy or sleepy. Strength and balance checks. Your health care provider may recommend certain tests to check your strength and balance while standing, walking, or changing positions. Foot health exam. Foot pain and numbness, as well as not wearing proper footwear, can make you more likely to have a fall. Screenings, including: Osteoporosis screening. Osteoporosis is a condition that causes  the bones to get weaker and break more easily. Blood pressure screening. Blood pressure changes and medicines to control blood pressure can make you feel dizzy. Depression screening. You may be more likely to have a fall if you have a fear of falling, feel depressed, or feel unable to do activities that you used to do. Alcohol use screening. Using too much alcohol can affect your balance and may make you more likely to have a fall. Follow these instructions at home: Lifestyle Do not drink alcohol if: Your health care provider tells you not to drink. If you drink alcohol: Limit how much you have to: 0-1 drink a day for women. 0-2 drinks a day for men. Know how much alcohol is in your drink. In the [place] one drink equals one 12 oz bottle of beer (355 mL), one 5 oz glass of wine (148 mL), or one 1 oz glass of hard liquor (44 mL). Do not use any products that contain nicotine or tobacco. These products include cigarettes, chewing tobacco, and vaping devices, such as e-cigarettes. If you need help quitting, ask your health care provider. Activity  Follow a regular exercise program to stay fit. This will help you maintain your balance. Ask your health care provider what types of exercise are appropriate for you. If you need a cane or walker, use it as recommended by your health care provider. Wear supportive shoes that have nonskid soles. Safety  Remove any tripping hazards, such as rugs, cords, and clutter. Install safety equipment such as grab bars in bathrooms and safety rails on stairs. Keep rooms and walkways   well-lit. General instructions Talk with your health care provider about your risks for falling. Tell your health care provider if: You fall. Be sure to tell your health care provider about all falls, even ones that seem minor. You feel dizzy, tiredness (fatigue), or off-balance. Take over-the-counter and prescription medicines only as told by your health care provider. These include  supplements. Eat a healthy diet and maintain a healthy weight. A healthy diet includes low-fat dairy products, low-fat (lean) meats, and fiber from whole grains, beans, and lots of fruits and vegetables. Stay current with your vaccines. Schedule regular health, dental, and eye exams. Summary Having a healthy lifestyle and getting preventive care can help to protect your health and wellness after age 66. Screening and testing are the best way to find a health problem early and help you avoid having a fall. Early diagnosis and treatment give you the best chance for managing medical conditions that are more common for people who are older than age 66. Falls are a major cause of broken bones and head injuries in people who are older than age 66. Take precautions to prevent a fall at home. Work with your health care provider to learn what changes you can make to improve your health and wellness and to prevent falls. This information is not intended to replace advice given to you by your health care provider. Make sure you discuss any questions you have with your health care provider. Document Revised: 06/16/2020 Document Reviewed: 06/16/2020 Elsevier Patient Education  2024 Elsevier Inc.  

## 2023-01-26 ENCOUNTER — Encounter: Payer: Self-pay | Admitting: Physician Assistant

## 2023-01-26 DIAGNOSIS — R7301 Impaired fasting glucose: Secondary | ICD-10-CM | POA: Insufficient documentation

## 2023-01-26 LAB — CBC WITH DIFFERENTIAL/PLATELET
Basophils Absolute: 0 10*3/uL (ref 0.0–0.2)
Basos: 1 %
EOS (ABSOLUTE): 0.1 10*3/uL (ref 0.0–0.4)
Eos: 2 %
Hematocrit: 49.1 % (ref 37.5–51.0)
Hemoglobin: 15.9 g/dL (ref 13.0–17.7)
Immature Grans (Abs): 0 10*3/uL (ref 0.0–0.1)
Immature Granulocytes: 1 %
Lymphocytes Absolute: 1.7 10*3/uL (ref 0.7–3.1)
Lymphs: 30 %
MCH: 28 pg (ref 26.6–33.0)
MCHC: 32.4 g/dL (ref 31.5–35.7)
MCV: 87 fL (ref 79–97)
Monocytes Absolute: 0.4 10*3/uL (ref 0.1–0.9)
Monocytes: 8 %
Neutrophils Absolute: 3.3 10*3/uL (ref 1.4–7.0)
Neutrophils: 58 %
Platelets: 238 10*3/uL (ref 150–450)
RBC: 5.67 x10E6/uL (ref 4.14–5.80)
RDW: 13.6 % (ref 11.6–15.4)
WBC: 5.6 10*3/uL (ref 3.4–10.8)

## 2023-01-26 LAB — CMP14+EGFR
ALT: 29 [IU]/L (ref 0–44)
AST: 19 [IU]/L (ref 0–40)
Albumin: 4.6 g/dL (ref 3.9–4.9)
Alkaline Phosphatase: 71 [IU]/L (ref 44–121)
BUN/Creatinine Ratio: 17 (ref 10–24)
BUN: 19 mg/dL (ref 8–27)
Bilirubin Total: 0.4 mg/dL (ref 0.0–1.2)
CO2: 21 mmol/L (ref 20–29)
Calcium: 9.3 mg/dL (ref 8.6–10.2)
Chloride: 103 mmol/L (ref 96–106)
Creatinine, Ser: 1.1 mg/dL (ref 0.76–1.27)
Globulin, Total: 2.4 g/dL (ref 1.5–4.5)
Glucose: 113 mg/dL — ABNORMAL HIGH (ref 70–99)
Potassium: 4.4 mmol/L (ref 3.5–5.2)
Sodium: 140 mmol/L (ref 134–144)
Total Protein: 7 g/dL (ref 6.0–8.5)
eGFR: 74 mL/min/{1.73_m2} (ref >59)

## 2023-01-26 LAB — LIPID PANEL
Chol/HDL Ratio: 4.5 {ratio} (ref 0.0–5.0)
Cholesterol, Total: 233 mg/dL — ABNORMAL HIGH (ref 100–199)
HDL: 52 mg/dL (ref >39)
LDL Chol Calc (NIH): 150 mg/dL — ABNORMAL HIGH (ref 0–99)
Triglycerides: 171 mg/dL — ABNORMAL HIGH (ref 0–149)
VLDL Cholesterol Cal: 31 mg/dL (ref 5–40)

## 2023-01-26 LAB — PSA: Prostate Specific Ag, Serum: <0.1 ng/mL (ref 0.0–4.0)

## 2023-01-26 NOTE — Progress Notes (Signed)
Barnet,   TG elevated.  LDL not to goal.   Your 10 year cardiovascular risk is elevated. I would start a statin to lower risk and decrease LDL. Thoughts?   [name] KitchenMarland KitchenThe 10-year ASCVD risk score (Arnett DK, et al., 2019) is: 14.4%   Values used to calculate the score:     Age: 67 years     Sex: Male     Is Non-Hispanic African American: No     Diabetic: No     Tobacco smoker: No     Systolic Blood Pressure: 126 mmHg     Is BP treated: No     HDL Cholesterol: 52 mg/dL     Total Cholesterol: 233 mg/dL  Glucose is elevated. We need to add A1C to labs to evaluate for diabetes.

## 2023-01-31 MED ORDER — ROSUVASTATIN CALCIUM 10 MG PO TABS: 10.0000 mg | ORAL_TABLET | Freq: Every day | ORAL | 3 refills | Status: DC

## 2023-02-08 ENCOUNTER — Ambulatory Visit (HOSPITAL_BASED_OUTPATIENT_CLINIC_OR_DEPARTMENT_OTHER): Payer: Medicare Other | Admitting: Family Medicine

## 2023-02-08 ENCOUNTER — Ambulatory Visit
Admission: EM | Admit: 2023-02-08 | Discharge: 2023-02-08 | Disposition: A | Discharge status: Home / Self Care | Payer: Medicare Other | Attending: Family Medicine | Admitting: Family Medicine

## 2023-02-08 DIAGNOSIS — R030 Elevated blood-pressure reading, without diagnosis of hypertension: Secondary | ICD-10-CM

## 2023-02-08 DIAGNOSIS — J069 Acute upper respiratory infection, unspecified: Secondary | ICD-10-CM

## 2023-02-08 HISTORY — DX: Pure hypercholesterolemia, unspecified: E78.00

## 2023-02-08 MED ORDER — PREDNISONE 50 MG PO TABS: ORAL_TABLET | ORAL | 0 refills | Status: DC

## 2023-02-08 MED ORDER — CEFDINIR 300 MG PO CAPS: 300.0000 mg | ORAL_CAPSULE | Freq: Two times a day (BID) | ORAL | 0 refills | Status: DC

## 2023-02-08 MED ORDER — HYDROCOD POLI-CHLORPHE POLI ER 10-8 MG/5ML PO SUER: 5.0000 mL | Freq: Two times a day (BID) | ORAL | 0 refills | Status: DC | PRN

## 2023-02-08 NOTE — ED Provider Notes (Signed)
 [facility]    CSN: [id number] Arrival date & time: 02/08/23  0903      History   Chief Complaint Chief Complaint  Patient presents with   URI    HPI Harold Edwards is a 67 y.o. male.   HPI Patient has sinus pressure and pain, postnasal drip, cough, thick green mucus.  He has fatigue, headache, body ache for the last 7 days.  He feels like the cough is getting worse.  He was around grandchildren at Christmas that all have runny noses.  Past Medical History:  Diagnosis Date   Back pain, chronic    Cancer (HCC)    Chronic cough 09/19/2017   High cholesterol    Influenza    Pneumonia    Prostate cancer (HCC) 02/21/2014   4+4=8 s/p TRUS biopsy by Dr. Shiela on 01/18/14  Last Assessment & Plan:   Patient be managed by urology.  Patient is status post prostatectomy and radiation therapy.  Denies hematuria.   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   Acute metabolic encephalopathy    Aortic atherosclerosis (HCC) 04/28/2020   Senile purpura (HCC) 10/31/2018   Former smoker, stopped smoking in distant past 04/20/2018   Lumbar back pain with radiculopathy affecting right lower extremity 04/14/2018   Chronic cough 09/19/2017   Mixed hyperlipidemia 09/19/2017   Sinus tachycardia 09/13/2017   ED (erectile dysfunction) 04/10/2014   Stress incontinence 04/10/2014   S/P prostatectomy 03/18/2014   History of prostate cancer 02/21/2014     Past Surgical History:  Procedure Laterality Date   ABDOMINAL SURGERY     PROSTATE SURGERY     Prostatectomy robot. Dr Saturnino and Dr Tanda   TONSILLECTOMY         Home Medications    Prior to Admission medications   Medication Sig Start Date End Date Taking? Authorizing Provider  cefdinir  (OMNICEF ) 300 MG capsule Take 1 capsule (300 mg total) by mouth 2 (two) times daily. 02/08/23  Yes Harold Jamee Jacob, MD  chlorpheniramine-HYDROcodone (TUSSIONEX) 10-8 MG/5ML Take 5 mLs by mouth every 12 (twelve) hours as needed for cough. 02/08/23  Yes Harold Jamee Jacob, MD  predniSONE  (DELTASONE ) 50 MG tablet Take once a day for 5 days.  Take with food 02/08/23  Yes Harold Jamee Jacob, MD  rosuvastatin  (CRESTOR ) 10 MG tablet Take 1 tablet (10 mg total) by mouth daily. 01/31/23   Harold Vermell CROME, PA-C    Family History Family History  Problem Relation Age of Onset   Breast cancer Other    Diabetes Other    Cancer Mother    Diabetes Mother    Cancer Father    Diabetes Father     Social History Social History   Tobacco Use   Smoking status: Former    Current packs/day: 0.00    Average packs/day: 1 pack/day for 14.6 years (14.6 ttl pk-yrs)    Types: Cigarettes    Start date: 02/08/1974    Quit date:  10/02/1988    Years since quitting: 34.3   Smokeless tobacco: Never  Vaping Use   Vaping status: Never Used  Substance Use Topics   Alcohol use: Yes    Comment: rarely   Drug use: Never     Allergies   Clavulanic acid and Amoxicillin   Review of Systems Review of Systems See HPI  Physical Exam Triage Vital Signs ED Triage Vitals  Encounter Vitals Group     BP 02/08/23 0916 (!) 152/101     Systolic BP Percentile --      Diastolic BP Percentile --      Pulse Rate 02/08/23 0916 96     Resp 02/08/23 0916 16     Temp 02/08/23 0916 97.8 F (36.6 C)     Temp src --      SpO2 02/08/23 0916 98 %     Weight --      Height --      Head Circumference --      Peak  Flow --      Pain Score 02/08/23 0914 10     Pain Loc --      Pain Education --      Exclude from Growth Chart --    No data found.  Updated Vital Signs BP (!) 152/101   Pulse 96   Temp 97.8 F (36.6 C)   Resp 16   SpO2 98%      Physical Exam Constitutional:      General: He is not in acute distress.    Appearance: He is well-developed and normal weight. He is ill-appearing.  HENT:     Head: Normocephalic and atraumatic.     Right Ear: Tympanic membrane normal.     Left Ear: Tympanic membrane normal.     Nose: Congestion and rhinorrhea present.     Mouth/Throat:     Pharynx: Posterior oropharyngeal erythema present.  Eyes:     Conjunctiva/sclera: Conjunctivae normal.     Pupils: Pupils are equal, round, and reactive to light.  Cardiovascular:     Rate and Rhythm: Normal rate and regular rhythm.     Heart sounds: Normal heart sounds.  Pulmonary:     Effort: Pulmonary effort is normal. No respiratory distress.     Breath sounds: Rhonchi present.  Abdominal:     General: There is no distension.     Palpations: Abdomen is soft.  Musculoskeletal:        General: Normal range of motion.     Cervical back: Normal range of motion.  Skin:    General: Skin is warm and dry.  Neurological:     Mental Status: He is alert.      UC Treatments / Results  Labs (all labs ordered are listed, but only abnormal results are displayed) Labs Reviewed - No data to display  EKG   Radiology No results found.  Procedures Procedures (including critical care time)  Medications Ordered in UC Medications - No data to display  Initial Impression / Assessment and Plan / UC Course  I have reviewed the triage vital signs and the nursing notes.  Pertinent labs & imaging results that were available during my care of the patient were reviewed by me and considered in my medical decision making (see chart for details).     Initial blood pressure elevated.  Advised to follow-up with  PCP Final Clinical Impressions(s) / UC Diagnoses   Final diagnoses:  Viral URI with cough  Upper respiratory  tract infection, unspecified type     Discharge Instructions      Take the cefdinir  2 times a day Take prednisone  daily as directed Drink lots of water May take over-the-counter cough and cold medicine as needed I have prescribed a cough medicine that is strong to take at bedtime.  If you take it during the day it may cause drowsiness See your doctor if not improving by the end of the week   ED Prescriptions     Medication Sig Dispense Auth. Provider   cefdinir  (OMNICEF ) 300 MG capsule Take 1 capsule (300 mg total) by mouth 2 (two) times daily. 20 capsule Harold Jamee Jacob, MD   predniSONE  (DELTASONE ) 50 MG tablet Take once a day for 5 days.  Take with food 5 tablet Harold Jamee Jacob, MD   chlorpheniramine-HYDROcodone (TUSSIONEX) 10-8 MG/5ML Take 5 mLs by mouth every 12 (twelve) hours as needed for cough. 115 mL Harold Jamee Jacob, MD      I have reviewed the PDMP during this encounter.   Harold Jamee Jacob, MD 02/08/23 [phone]

## 2023-02-08 NOTE — ED Triage Notes (Signed)
 Pt presents to uc with co of productive cough, chest congestion, sore throat and fatigue for 7 days. Pt reports he thinks he may have gotten it from his grandchildren. Pt reports otc vicks cold and flu.

## 2023-02-08 NOTE — Discharge Instructions (Signed)
 Take the cefdinir  2 times a day Take prednisone  daily as directed Drink lots of water May take over-the-counter cough and cold medicine as needed I have prescribed a cough medicine that is strong to take at bedtime.  If you take it during the day it may cause drowsiness See your doctor if not improving by the end of the week

## 2023-02-14 ENCOUNTER — Ambulatory Visit (INDEPENDENT_AMBULATORY_CARE_PROVIDER_SITE_OTHER): Payer: Medicare Other | Admitting: Medical-Surgical

## 2023-02-14 ENCOUNTER — Encounter: Payer: Self-pay | Admitting: Medical-Surgical

## 2023-02-14 VITALS — BP 122/77 | HR 105 | Temp 97.4°F | Resp 20 | Ht 73.0 in | Wt 228.0 lb

## 2023-02-14 DIAGNOSIS — J329 Chronic sinusitis, unspecified: Secondary | ICD-10-CM | POA: Diagnosis not present

## 2023-02-14 DIAGNOSIS — J40 Bronchitis, not specified as acute or chronic: Secondary | ICD-10-CM

## 2023-02-14 MED ORDER — AZITHROMYCIN 250 MG PO TABS: ORAL_TABLET | ORAL | 0 refills | Status: AC

## 2023-02-14 MED ORDER — ALBUTEROL SULFATE HFA 108 (90 BASE) MCG/ACT IN AERS: 2.0000 | INHALATION_SPRAY | Freq: Four times a day (QID) | RESPIRATORY_TRACT | 0 refills | Status: DC | PRN

## 2023-02-14 NOTE — Progress Notes (Signed)
 Established patient visit  History, exam, impression, and plan:  1. Sinobronchitis (Primary) Pleasant 68 year old male presenting today for evaluation of upper respiratory symptoms companied by a cough productive of thick green mucus.  Has had these symptoms for approximately 2 weeks after being exposed to his grandchildren who had respiratory infections.  Reports that he continues to have sinus pressure, sinus congestion, and a persistent cough.  Went to urgent care on 02/08/2023 and was treated with Omnicef , prednisone , and Tussionex.  Has completed the prednisone  and is most of the way through Omnicef  but reports his symptoms are getting worse and not better.  Still has Tussionex at home to use for cough.  Plan to switch to azithromycin  for atypical coverage.  Adding an albuterol  inhaler.  Continue Tussionex.  Offered chest x-ray but this was declined today.  Advised him to monitor his symptoms and if they worsen or fail to improve with this therapy, he is welcome to send me a message and I will be glad to order a chest x-ray at that time. - azithromycin  (ZITHROMAX ) 250 MG tablet; Take 2 tablets on day 1, then 1 tablet daily on days 2 through 5  Dispense: 6 tablet; Refill: 0  Review of Systems  Constitutional:  Negative for chills, fever and malaise/fatigue.  HENT:  Positive for congestion and sore throat.   Respiratory:  Positive for cough, sputum production and shortness of breath. Negative for wheezing.   Cardiovascular:  Negative for chest pain, palpitations and leg swelling.  Neurological:  Negative for dizziness and headaches.  Psychiatric/Behavioral:  Negative for depression and suicidal ideas. The patient is not nervous/anxious and does not have insomnia.    Physical Exam Vitals and nursing note reviewed.  Constitutional:      General: He is not in acute distress.    Appearance: Normal appearance. He is well-developed. He is not ill-appearing.  HENT:     Head: Normocephalic  and atraumatic.     Right Ear: Tympanic membrane and ear canal normal. No middle ear effusion.     Left Ear: Tympanic membrane and ear canal normal.  No middle ear effusion.     Nose: Congestion present.     Mouth/Throat:     Mouth: Mucous membranes are moist.     Pharynx: Posterior oropharyngeal erythema present.  Eyes:     Conjunctiva/sclera: Conjunctivae normal.     Pupils: Pupils are equal, round, and reactive to light.  Neck:     Thyroid : No thyromegaly.  Cardiovascular:     Rate and Rhythm: Normal rate and regular rhythm.     Pulses: Normal pulses.     Heart sounds: Normal heart sounds. No murmur heard.    No friction rub. No gallop.  Pulmonary:     Effort: Pulmonary effort is normal. No respiratory distress.     Breath sounds: Decreased air movement (Mild) present.  Musculoskeletal:     Cervical back: Neck supple.  Lymphadenopathy:     Cervical: Cervical adenopathy present.  Skin:    General: Skin is warm and dry.  Neurological:     Mental Status: He is alert and oriented to person, place, and time.  Psychiatric:        Mood and Affect: Mood normal.        Behavior: Behavior normal.        Thought Content: Thought content normal.        Judgment: Judgment normal.   Procedures performed this visit: None.  Return if symptoms worsen or fail to improve.  __________________________________ Zada FREDRIK Palin, DNP, APRN, FNP-BC Primary Care and Sports Medicine [facility] [place]

## 2023-02-15 ENCOUNTER — Ambulatory Visit: Payer: Medicare Other | Admitting: Physician Assistant

## 2023-02-18 ENCOUNTER — Encounter: Payer: Self-pay | Admitting: Medical-Surgical

## 2023-02-21 ENCOUNTER — Encounter: Payer: Self-pay | Admitting: Medical-Surgical

## 2023-02-21 ENCOUNTER — Ambulatory Visit (INDEPENDENT_AMBULATORY_CARE_PROVIDER_SITE_OTHER): Payer: Medicare Other | Admitting: Medical-Surgical

## 2023-02-21 VITALS — BP 143/81 | HR 114 | Resp 20 | Ht 73.0 in | Wt 221.9 lb

## 2023-02-21 DIAGNOSIS — R059 Cough, unspecified: Secondary | ICD-10-CM | POA: Diagnosis not present

## 2023-02-21 MED ORDER — BENZONATATE 200 MG PO CAPS: 200.0000 mg | ORAL_CAPSULE | Freq: Two times a day (BID) | ORAL | 0 refills | Status: DC | PRN

## 2023-02-21 MED ORDER — ALBUTEROL SULFATE (2.5 MG/3ML) 0.083% IN NEBU: 2.5000 mg | INHALATION_SOLUTION | Freq: Four times a day (QID) | RESPIRATORY_TRACT | 1 refills | Status: DC | PRN

## 2023-02-21 MED ORDER — FULL KIT NEBULIZER SET MISC: 0 refills | Status: DC

## 2023-02-21 MED ORDER — GUAIFENESIN-CODEINE 100-10 MG/5ML PO SOLN: 5.0000 mL | Freq: Three times a day (TID) | ORAL | 0 refills | Status: DC | PRN

## 2023-02-21 NOTE — Telephone Encounter (Signed)
 Attempted call to patient. Left a detailed voice mail message on home # ( allowed on DPR)

## 2023-02-21 NOTE — Telephone Encounter (Signed)
 Can we get him in for a NV to try a neb treatment to see if this helps a bit? If so, we can do a home prescription for him.

## 2023-02-21 NOTE — Progress Notes (Signed)
        Established patient visit  History, exam, impression, and plan:  1. Cough in adult patient (Primary) Pleasant 68 year old male presenting with reports of continued issues with coughing.  His illness started approximately 2 weeks ago and he was treated with a prednisone  burst as well as 2 separate antibiotics.  He completed the medications without difficulty but notes that the cough has remained.  He is no longer coughing up green sputum but now reports having a bit of a runny nose.  Having difficulty sleeping at night due to coughing.  Has an upcoming surgery in 2 weeks and wants to get this taking care of.  Reports that his mother-in-law had similar symptoms and she has been treated with a nebulizer which seem to work for her.  Discussed the etiology and nature of a postviral cough.  Unfortunately he has multiple contributing factors.  Of note, he does report this cough started approximately 5 months ago and has just progressively gotten worse rather than at the time of his illness a couple of weeks ago.  We did review possible contributors including medication side effects, allergies with postnasal drip, and reflux.  He is only on Crestor  so no concern for cough associated with that.  Recommend starting Zyrtec or a similar antihistamine once daily for the next 2 weeks to see if this is helpful.  Would also like him to consider starting an over-the-counter reflux aid such as Prilosec or Nexium for a 2-week trial to see if this helps.  Sending in Tessalon  Perles twice daily as needed for cough.  I will go ahead and give him a prescription for a nebulizer machine and send in albuterol  nebulizer solution if the oral medications are not helpful.  Adding Robitussin AC for nighttime cough management.  Holding off on further prednisone  due to his upcoming procedure. - Respiratory Therapy Supplies (FULL KIT NEBULIZER SET) MISC; Nebulizer machine of patient's choice, please include tubes and hoses as needed.   Use as directed.  Dispense: 1 each; Refill: 0 - albuterol  (PROVENTIL ) (2.5 MG/3ML) 0.083% nebulizer solution; Take 3 mLs (2.5 mg total) by nebulization every 6 (six) hours as needed for wheezing or shortness of breath.  Dispense: 150 mL; Refill: 1 - benzonatate  (TESSALON ) 200 MG capsule; Take 1 capsule (200 mg total) by mouth 2 (two) times daily as needed for cough.  Dispense: 20 capsule; Refill: 0   Procedures performed this visit: None.  Return if symptoms worsen or fail to improve.  __________________________________ Zada FREDRIK Palin, DNP, APRN, FNP-BC Primary Care and Sports Medicine [facility]

## 2023-02-22 ENCOUNTER — Telehealth: Payer: Self-pay

## 2023-02-22 NOTE — Telephone Encounter (Signed)
 Copied from CRM [id number]. Topic: Clinical - Medical Advice >> Feb 21, 2023 12:28 PM Nestora J wrote: Reason for CRM: Pts wife missed a call in regards to pts neb. Treatment. She called back and has agreed to the treatment and would like for that home prescription to be sent over. Pt's wife is requesting a callback to discuss next steps, she also wants to know if it would be covered under insurance

## 2023-02-24 NOTE — Telephone Encounter (Signed)
Attempted call again to patient. Left voice mail message requesting a return call.  

## 2023-02-24 NOTE — Telephone Encounter (Signed)
Copied from CRM [id number]. Topic: General - Other >> Feb 24, 2023  9:07 AM Nila Nephew wrote: Reason for CRM: Patient returning call to office from nurse about previous CRM.

## 2023-03-02 NOTE — Telephone Encounter (Signed)
Patient seen in office 02/21/23 regarding nebulizer.  Order written at the visit per office note

## 2023-03-04 ENCOUNTER — Other Ambulatory Visit: Payer: Self-pay | Admitting: Medical-Surgical

## 2023-03-07 DIAGNOSIS — M5416 Radiculopathy, lumbar region: Secondary | ICD-10-CM | POA: Diagnosis not present

## 2023-03-07 DIAGNOSIS — M5116 Intervertebral disc disorders with radiculopathy, lumbar region: Secondary | ICD-10-CM | POA: Diagnosis not present

## 2023-07-06 DIAGNOSIS — M5416 Radiculopathy, lumbar region: Secondary | ICD-10-CM | POA: Diagnosis not present

## 2023-07-14 ENCOUNTER — Other Ambulatory Visit: Payer: Self-pay

## 2023-07-14 ENCOUNTER — Ambulatory Visit: Attending: Neurological Surgery

## 2023-07-14 DIAGNOSIS — M79651 Pain in right thigh: Secondary | ICD-10-CM | POA: Diagnosis not present

## 2023-07-14 DIAGNOSIS — M5416 Radiculopathy, lumbar region: Secondary | ICD-10-CM | POA: Diagnosis not present

## 2023-07-14 DIAGNOSIS — R262 Difficulty in walking, not elsewhere classified: Secondary | ICD-10-CM | POA: Insufficient documentation

## 2023-07-14 DIAGNOSIS — M5459 Other low back pain: Secondary | ICD-10-CM | POA: Diagnosis not present

## 2023-07-14 NOTE — Therapy (Signed)
 OUTPATIENT PHYSICAL THERAPY THORACOLUMBAR EVALUATION   Patient Name: Harold Edwards MRN: [id number] DOB:[birth year], 68 y.o., male Today's Date: 07/14/2023  END OF SESSION:  PT End of Session - 07/14/23 1131     Visit Number 1    Number of Visits 24    Date for PT Re-Evaluation 10/06/23    Authorization Type [employer] MEDICARE  *PRIOR AUTH REQUIRED*    Progress Note Due on Visit 10    PT Start Time 0805    PT Stop Time 0845    PT Time Calculation (min) 40 min    Activity Tolerance Patient tolerated treatment well             Past Medical History:  Diagnosis Date   Back pain, chronic    Cancer (HCC)    Chronic cough 09/19/2017   High cholesterol    Influenza    Pneumonia    Prostate cancer (HCC) 02/21/2014   4+4=8 s/p TRUS biopsy by Dr. Ami Kail on 01/18/14  Last Assessment & Plan:   Patient be managed by urology.  Patient is status post prostatectomy and radiation therapy.  Denies hematuria.   Sinus tachycardia 09/13/2017   Stress incontinence 04/10/2014   Past Surgical History:  Procedure Laterality Date   ABDOMINAL SURGERY     PROSTATE SURGERY     Prostatectomy robot. 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   Acute metabolic encephalopathy    Aortic atherosclerosis (HCC) 04/28/2020   Senile purpura (HCC) 10/31/2018   Former smoker, stopped smoking in distant past 04/20/2018   Lumbar back pain with radiculopathy affecting right lower extremity 04/14/2018    Chronic cough 09/19/2017   Mixed hyperlipidemia 09/19/2017   Sinus tachycardia 09/13/2017   ED (erectile dysfunction) 04/10/2014   Stress incontinence 04/10/2014   S/P prostatectomy 03/18/2014   History of prostate cancer 02/21/2014    PCP: Sandy Crumb, PA-C  REFERRING PROVIDER: Waymond Hailey, MD  REFERRING DIAG: M54.16 (ICD-10-CM) - Radiculopathy, lumbar region   Rationale for Evaluation and Treatment: Rehabilitation  THERAPY DIAG:  Difficulty in walking, not elsewhere classified  Other low back pain  Pain in right thigh  ONSET DATE: Chronic (referral date 07/07/2023)  SUBJECTIVE:  SUBJECTIVE STATEMENT: The patient stated he has had low back pain for over 30 years and has been seen at this clinic a number of years ago for low back pain.. Most recently, he underwent a microdiscectomy procedure about 3-4 months ago, which helped with pain and paresthesias he was experiencing at his R anterior thigh. However, he has more recently started to experience L side low back pain as well as pain down the back of his R leg. The patient reported that this pain is worse with prolonged sitting, as well as prolonged weightbearing, like standing or walking. The other day, he was walking around [employer] and he could only make it half way through the store before he was experiencing significant low back and R leg pain. No position completely relieves the pain when resting. Ice and stretching help temporarily but the pain comes back to the same levels. He was prescribed a muscle relaxer, but he does not feel this helps much.   PERTINENT HISTORY:  No additional pertinent history described by the patient.  PAIN:  Are you having pain? Yes: NPRS scale: Current 7-8/10 at L low back, 6-7/10 R leg; 10/10 worse Pain  location: See above Pain description: Sharp, ache Aggravating factors: Walking, activity, sitting Relieving factors: Ice, stretching (temporary)  PRECAUTIONS: None  RED FLAGS: None   WEIGHT BEARING RESTRICTIONS: No  FALLS:  Has patient fallen in last 6 months? No  PATIENT GOALS: Reduce pain, improve function   OBJECTIVE:  Note: Objective measures were completed at Evaluation unless otherwise noted.  DIAGNOSTIC FINDINGS:  From medical record, 08/23/2022:  CLINICAL DATA:  Low back pain with history of trauma. Right radiculopathy, worsening.   EXAM: MRI LUMBAR SPINE WITHOUT CONTRAST   TECHNIQUE: Multiplanar, multisequence MR imaging of the lumbar spine was performed. No intravenous contrast was administered.   COMPARISON:  08/27/2018   FINDINGS: Segmentation:  Standard.   Alignment:  Physiologic.   Vertebrae:  No fracture, evidence of discitis, or bone lesion.   Conus medullaris and cauda equina: Conus extends to the L1 level. Conus and cauda equina appear normal.   Paraspinal and other soft tissues: Negative for perispinal mass or inflammation.   Disc levels:   T12- L1: Posterior annular fissure.  No impingement   L1-L2: Mild disc narrowing and bulging with no neural impingement   L2-L3: Right paracentral to foraminal extrusion with upward migration. Advanced right L2 impingement. Disc is narrowed and bulging eccentric to the right. Mild facet spurring.   L3-L4: Disc narrowing and bulging with right foraminal annular fissure. Degenerative facet spurring on both sides. No neural compression.   L4-L5: Near complete disc collapse with endplate ridging and disc bulging. Mild to moderate degenerative facet spurring. Mild to moderate bilateral foraminal narrowing   L5-S1:Disc collapse with endplate ridging and small chronic herniation which is downward pointing and central. Negative facets. Mild bilateral foraminal narrowing, no neural compression.    IMPRESSION: 1. Symptomatic finding is likely at L2-3 where there is a right paracentral to foraminal extrusion with upward migration. Pronounced right L2 impingement. 2. Disc and facet degeneration at L3-4 and below is similar to 2020 with mild to moderate foraminal narrowing bilaterally at L4-5  PATIENT SURVEYS:  Patient-specific activity scoring scheme (Point to one number):  "0" represents "unable to perform." "10" represents "able to perform at prior level. Activity Initial  Activity Eval     Walking 6    Sitting 6    Total: 12    Minimum detectable change (90%CI)  for average score = 2 points Minimum detectable change (90%CI) for single activity score = 3 points PSFS developed by: [place] Spitz., & Binkley, J. (1995). Assessing disability and change on individual patients: a report of a patient specific measure. Physiotherapy [place], [phone], [phone]. Reproduced with the permission of the authors  PALPATION: -No notable pain reporduced with palpation of the R or L lumbar paraspinals or R or L posterolateral hip musculature -Some tenderness with PA glides at the lumbar spine, not no major pain.  LUMBAR ROM:   AROM eval  Flexion Top of feet, pain across pelvis   Extension 5 degrees, L side low back pain  Right lateral flexion Reaches mid-thigh, low back pain  Left lateral flexion Reaches mid-thigh, low back pain  Right rotation   Left rotation    (Blank rows = not tested)  LOWER EXTREMITY ROM:     Passive  Right eval Left eval  Hip flexion WNL WNL  Hip extension WNL WNL  Hip abduction WNL WNL  Hip adduction    Hip internal rotation at 90 deg flexion WNL WNL  Hip external rotation at 90 deg flexion 45 25  Knee flexion WNL WNL  Knee extension WNL WNL  Ankle dorsiflexion    Ankle plantarflexion    Ankle inversion    Ankle eversion     (Blank rows = not tested)  LOWER EXTREMITY MMT:    MMT Right eval Left eval  Trunk rotation in hooklying,  testing through lower extremities 3+** 4*  Trunk rotation in hooklying, testing through upper extremities 3+** 4*  Hip flexion 4* 4*  Hip extension 4* 4*  Hip abduction 4* 4*  Hip adduction 4** 4**  Hip internal rotation 5* 5*  Hip external rotation 4* 4*  Knee flexion 5* 5*  Knee extension 5* 5**  Ankle dorsiflexion    Ankle plantarflexion    Ankle inversion    Ankle eversion     (Blank rows = not tested)  *= low back pain **= significant low back pain  LUMBAR SPECIAL TESTS:  Straight leg raise test: Negative bilaterally  [facility] Adult PT Treatment:                                                DATE: 07/14/2023 Therapeutic Exercise: Instructed home program (see below)  PATIENT EDUCATION:  Education details: on current presentation, on HEP, on clinical outcomes score and POC Person educated: Patient Education method: Explanation, Demonstration, and Handouts Education comprehension: verbalized understanding   HOME EXERCISE PROGRAM: Access Code: [email or web address] URL: [email or web address] Date: 07/14/2023 Prepared by: Jeannette Mills  Exercises - Hooklying hip extension + hip adduction + shoulder flexion isometric  - 1 x daily - 7 x weekly - 5 sets - 10 sec hold  ASSESSMENT:  CLINICAL IMPRESSION: Patient is a 68 y.o. who was seen today for physical therapy evaluation and treatment for acute on chronic low back pain. Limitations in trunk range of motion were noted, particularly into extension and bilateral side flexion. Hip range of motion was generally good, but testing strength of the hip musculature did cause low back pain. Weakness was noted, but may be related to pain inhibition. Straight leg raise testing was negative bilaterally. Assessed for movement preferences for lower extremity and upper extremity movements. Patient responded better to resisted hip extension compared to hip flexion,  resisted hip internal rotation/adduction compared to external rotation/abduction,  and resisted shoulder flexion compared to extension. Provided a home exercises incorporating these movements which patient reported to provide some relief upon standing after performance. Will continue to assess for directional preferences, as well as generally building hip and core strength. Physical therapy is indicated.   OBJECTIVE IMPAIRMENTS: decreased mobility, difficulty walking, decreased ROM, decreased strength, and pain.   REHAB POTENTIAL: Fair 30 year history of pain  CLINICAL DECISION MAKING: Evolving/moderate complexity  EVALUATION COMPLEXITY: Moderate   GOALS: Goals reviewed with patient? yes  SHORT TERM GOALS: 08/25/2023  Patient will be independent in self management strategies to improve quality of life and functional outcomes. Baseline: New Program Goal status: INITIAL  2.  Patient will report at least 25% improvement in overall symptoms and/or function to demonstrate improved functional mobility Baseline: 0% better Goal status: INITIAL  3.  Patient will improve R and L trunk side flexion to reach at least the distal 1/3 of the thigh to demonstrate improved spinal mobility. Baseline: mid-thigh R and L Goal status: INITIAL  LONG TERM GOALS: 10/06/2023  Patient will report at least 50% improvement in overall symptoms and/or function to demonstrate improved functional mobility Baseline: 0% better Goal status: INITIAL  2.  Patient will walking item on the Patient Specific Functional Scale to by at least 2 points. Baseline: 6 points Goal status: INITIAL  3.  Patient will sitting item on the Patient Specific Functional Scale to by at least 2 points. Baseline: 6 points Goal status: INITIAL  PLAN:  PT FREQUENCY: 2x/week  PT DURATION: 12 weeks  PLANNED INTERVENTIONS: 97110-Therapeutic exercises, 97530- Therapeutic activity, 97112- Neuromuscular re-education, [phone]- Self Care, 47829- Manual therapy, [phone]- Gait training, [phone]- Orthotic Fit/training, [phone]-  Canalith repositioning, [id number]- Aquatic Therapy, 97014- Electrical stimulation (unattended), [phone]- Ionotophoresis 4mg /ml Dexamethasone , Patient/Family education, Balance training, Stair training, Taping, Dry Needling, Joint mobilization, Joint manipulation, Spinal manipulation, Spinal mobilization, Cryotherapy, and Moist heat   PLAN FOR NEXT SESSION: hip and core strengthening, continue assessing for movement preferences, continue progressing home program   Zoe Hinds, PT 07/14/2023, 11:34 AM

## 2023-07-25 ENCOUNTER — Ambulatory Visit

## 2023-07-25 DIAGNOSIS — M5416 Radiculopathy, lumbar region: Secondary | ICD-10-CM | POA: Diagnosis not present

## 2023-07-25 DIAGNOSIS — M79651 Pain in right thigh: Secondary | ICD-10-CM | POA: Diagnosis not present

## 2023-07-25 DIAGNOSIS — R262 Difficulty in walking, not elsewhere classified: Secondary | ICD-10-CM | POA: Diagnosis not present

## 2023-07-25 DIAGNOSIS — M5459 Other low back pain: Secondary | ICD-10-CM

## 2023-07-25 NOTE — Therapy (Addendum)
 OUTPATIENT PHYSICAL THERAPY THORACOLUMBAR TREATMENT   Patient Name: Harold Edwards MRN: [id number] DOB:13-Jan-1956, 68 y.o., male Today's Date: 07/25/2023  END OF SESSION:  PT End of Session - 07/25/23 1152     Visit Number 2    Number of Visits 24    Date for PT Re-Evaluation 10/06/23    Authorization Type [employer] MEDICARE  *PRIOR AUTH REQUIRED*    Progress Note Due on Visit 10    PT Start Time 1152    PT Stop Time 1235    PT Time Calculation (min) 43 min    Activity Tolerance Patient tolerated treatment well    Behavior During Therapy WFL for tasks assessed/performed          Past Medical History:  Diagnosis Date   Back pain, chronic    Cancer (HCC)    Chronic cough 09/19/2017   High cholesterol    Influenza    Pneumonia    Prostate cancer (HCC) 02/21/2014   4+4=8 s/p TRUS biopsy by Dr. Ami Kail on 01/18/14  Last Assessment & Plan:   Patient be managed by urology.  Patient is status post prostatectomy and radiation therapy.  Denies hematuria.   Sinus tachycardia 09/13/2017   Stress incontinence 04/10/2014   Past Surgical History:  Procedure Laterality Date   ABDOMINAL SURGERY     PROSTATE SURGERY     Prostatectomy robot. 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   Acute metabolic encephalopathy    Aortic atherosclerosis (HCC) 04/28/2020   Senile purpura (HCC) 10/31/2018   Former smoker, stopped smoking in distant past 04/20/2018   Lumbar back pain with  radiculopathy affecting right lower extremity 04/14/2018   Chronic cough 09/19/2017   Mixed hyperlipidemia 09/19/2017   Sinus tachycardia 09/13/2017   ED (erectile dysfunction) 04/10/2014   Stress incontinence 04/10/2014   S/P prostatectomy 03/18/2014   History of prostate cancer 02/21/2014    PCP: Sandy Crumb, PA-C  REFERRING PROVIDER: Waymond Hailey, MD  REFERRING DIAG: M54.16 (ICD-10-CM) - Radiculopathy, lumbar region   Rationale for Evaluation and Treatment: Rehabilitation  THERAPY DIAG:  Difficulty in walking, not elsewhere classified  Other low back pain  Pain in right thigh  ONSET DATE: Chronic (referral date 07/07/2023)  SUBJECTIVE:  SUBJECTIVE STATEMENT: Patient reports his pain starts at Lt side of low back and radiates over to the Rt and down lateral Rt thigh; reports 5-6/10 pain primarily down Rt lateral thigh and 8/10 constant pain in low back.   The patient stated he has had low back pain for over 30 years and has been seen at this clinic a number of years ago for low back pain.. Most recently, he underwent a microdiscectomy procedure about 3-4 months ago, which helped with pain and paresthesias he was experiencing at his R anterior thigh. However, he has more recently started to experience L side low back pain as well as pain down the back of his R leg. The patient reported that this pain is worse with prolonged sitting, as well as prolonged weightbearing, like standing or walking. The other day, he was walking around [employer] and he could only make it half way through the store before he was experiencing significant low back and R leg pain. No position completely relieves the pain when resting. Ice and stretching help temporarily but the pain comes back to the same levels. He was  prescribed a muscle relaxer, but he does not feel this helps much.   PERTINENT HISTORY:  No additional pertinent history described by the patient.  PAIN:  Are you having pain? Yes: NPRS scale: Current 7-8/10 at L low back, 6-7/10 R leg; 10/10 worse Pain location: See above Pain description: Sharp, ache Aggravating factors: Walking, activity, sitting Relieving factors: Ice, stretching (temporary)  PRECAUTIONS: None  RED FLAGS: None   WEIGHT BEARING RESTRICTIONS: No  FALLS:  Has patient fallen in last 6 months? No  PATIENT GOALS: Reduce pain, improve function   OBJECTIVE:  Note: Objective measures were completed at Evaluation unless otherwise noted.  DIAGNOSTIC FINDINGS:  From medical record, 08/23/2022:  CLINICAL DATA:  Low back pain with history of trauma. Right radiculopathy, worsening.   EXAM: MRI LUMBAR SPINE WITHOUT CONTRAST   TECHNIQUE: Multiplanar, multisequence MR imaging of the lumbar spine was performed. No intravenous contrast was administered.   COMPARISON:  08/27/2018   FINDINGS: Segmentation:  Standard.   Alignment:  Physiologic.   Vertebrae:  No fracture, evidence of discitis, or bone lesion.   Conus medullaris and cauda equina: Conus extends to the L1 level. Conus and cauda equina appear normal.   Paraspinal and other soft tissues: Negative for perispinal mass or inflammation.   Disc levels:   T12- L1: Posterior annular fissure.  No impingement   L1-L2: Mild disc narrowing and bulging with no neural impingement   L2-L3: Right paracentral to foraminal extrusion with upward migration. Advanced right L2 impingement. Disc is narrowed and bulging eccentric to the right. Mild facet spurring.   L3-L4: Disc narrowing and bulging with right foraminal annular fissure. Degenerative facet spurring on both sides. No neural compression.   L4-L5: Near complete disc collapse with endplate ridging and disc bulging. Mild to moderate degenerative  facet spurring. Mild to moderate bilateral foraminal narrowing   L5-S1:Disc collapse with endplate ridging and small chronic herniation which is downward pointing and central. Negative facets. Mild bilateral foraminal narrowing, no neural compression.   IMPRESSION: 1. Symptomatic finding is likely at L2-3 where there is a right paracentral to foraminal extrusion with upward migration. Pronounced right L2 impingement. 2. Disc and facet degeneration at L3-4 and below is similar to 2020 with mild to moderate foraminal narrowing bilaterally at L4-5  PATIENT SURVEYS:  Patient-specific activity scoring scheme (Point to one number):  0 represents "  unable to perform." 10 represents "able to perform at prior level. Activity Initial  Activity Eval     Walking 6    Sitting 6    Total: 12    Minimum detectable change (90%CI) for average score = 2 points Minimum detectable change (90%CI) for single activity score = 3 points PSFS developed by: [place] Spitz., & Binkley, J. (1995). Assessing disability and change on individual patients: a report of a patient specific measure. Physiotherapy [place], [phone], [phone]. Reproduced with the permission of the authors  PALPATION: -No notable pain reporduced with palpation of the R or L lumbar paraspinals or R or L posterolateral hip musculature -Some tenderness with PA glides at the lumbar spine, not no major pain.  LUMBAR ROM:   AROM eval  Flexion Top of feet, pain across pelvis   Extension 5 degrees, L side low back pain  Right lateral flexion Reaches mid-thigh, low back pain  Left lateral flexion Reaches mid-thigh, low back pain  Right rotation   Left rotation    (Blank rows = not tested)  LOWER EXTREMITY ROM:     Passive  Right eval Left eval  Hip flexion WNL WNL  Hip extension WNL WNL  Hip abduction WNL WNL  Hip adduction    Hip internal rotation at 90 deg flexion WNL WNL  Hip external rotation at 90 deg  flexion 45 25  Knee flexion WNL WNL  Knee extension WNL WNL  Ankle dorsiflexion    Ankle plantarflexion    Ankle inversion    Ankle eversion     (Blank rows = not tested)  LOWER EXTREMITY MMT:    MMT Right eval Left eval  Trunk rotation in hooklying, testing through lower extremities 3+** 4*  Trunk rotation in hooklying, testing through upper extremities 3+** 4*  Hip flexion 4* 4*  Hip extension 4* 4*  Hip abduction 4* 4*  Hip adduction 4** 4**  Hip internal rotation 5* 5*  Hip external rotation 4* 4*  Knee flexion 5* 5*  Knee extension 5* 5**  Ankle dorsiflexion    Ankle plantarflexion    Ankle inversion    Ankle eversion     (Blank rows = not tested)  *= low back pain **= significant low back pain  LUMBAR SPECIAL TESTS:  Straight leg raise test: Negative bilaterally   [facility] Adult PT Treatment:                                                DATE: 07/25/2023 Therapeutic Exercise: HEP review Prone + pillow under stomach: Double leg wipers --> discomfort when feet go left Lt single leg hip passive IR + pin & stretch at lumbar paraspinal Manual Therapy: IASTM lumbar paraspinals Cupping --> gliding bilateral; static cups Lt lumbar Neuromuscular re-ed: Diaphragmatic breathing in hooklying with frequent cues for breathing mechanics and alleviating postural tension Hooklying hip add isometric -> flared up Rt  Hooklying hip abd isometric --> flared up Rt Quadruped diaphragmatic breathing + tactile cues  Quadruped rocking     [facility] Adult PT Treatment:                                                DATE:  07/14/2023 Therapeutic Exercise: Instructed home program (see below)  PATIENT EDUCATION:  Education details: on current presentation, on HEP, on clinical outcomes score and POC Person educated: Patient Education method: Explanation, Demonstration, and Handouts Education comprehension: verbalized understanding   HOME EXERCISE PROGRAM: Access Code: [email or web address] URL:  [email or web address] Date: 07/25/2023 Prepared by: Sims Duck  Exercises - Hooklying hip extension + hip adduction + shoulder flexion isometric  - 1 x daily - 7 x weekly - 5 sets - 10 sec hold - Supine Diaphragmatic Breathing  - 1 x daily - 7 x weekly - 3 sets - 10 reps - Quadruped Rocking Slow  - 1 x daily - 7 x weekly - 3 sets - 10 reps  ASSESSMENT:  CLINICAL IMPRESSION: Bed mobility reviewed for log rolling to alleviate spinal tension/pain. Hip isometrics attempted in hooklying, however right-sided lumbar/posterior hip pain increased with both add and abd. Noted trunk and postural tension in supine and quadruped with diaphragmatic breathing variations; frequent cueing improved body mechanics,however no change in proprioceptive awareness.  Patient is a 68 y.o. who was seen today for physical therapy evaluation and treatment for acute on chronic low back pain. Limitations in trunk range of motion were noted, particularly into extension and bilateral side flexion. Hip range of motion was generally good, but testing strength of the hip musculature did cause low back pain. Weakness was noted, but may be related to pain inhibition. Straight leg raise testing was negative bilaterally. Assessed for movement preferences for lower extremity and upper extremity movements. Patient responded better to resisted hip extension compared to hip flexion, resisted hip internal rotation/adduction compared to external rotation/abduction, and resisted shoulder flexion compared to extension. Provided a home exercises incorporating these movements which patient reported to provide some relief upon standing after performance. Will continue to assess for directional preferences, as well as generally building hip and core strength. Physical therapy is indicated.   OBJECTIVE IMPAIRMENTS: decreased mobility, difficulty walking, decreased ROM, decreased strength, and pain.   REHAB POTENTIAL: Fair 30 year  history of pain  CLINICAL DECISION MAKING: Evolving/moderate complexity  EVALUATION COMPLEXITY: Moderate   GOALS: Goals reviewed with patient? yes  SHORT TERM GOALS: 08/25/2023  Patient will be independent in self management strategies to improve quality of life and functional outcomes. Baseline: New Program Goal status: INITIAL  2.  Patient will report at least 25% improvement in overall symptoms and/or function to demonstrate improved functional mobility Baseline: 0% better Goal status: INITIAL  3.  Patient will improve R and L trunk side flexion to reach at least the distal 1/3 of the thigh to demonstrate improved spinal mobility. Baseline: mid-thigh R and L Goal status: INITIAL  LONG TERM GOALS: 10/06/2023  Patient will report at least 50% improvement in overall symptoms and/or function to demonstrate improved functional mobility Baseline: 0% better Goal status: INITIAL  2.  Patient will walking item on the Patient Specific Functional Scale to by at least 2 points. Baseline: 6 points Goal status: INITIAL  3.  Patient will sitting item on the Patient Specific Functional Scale to by at least 2 points. Baseline: 6 points Goal status: INITIAL  PLAN:  PT FREQUENCY: 2x/week  PT DURATION: 12 weeks  PLANNED INTERVENTIONS: 97110-Therapeutic exercises, 97530- Therapeutic activity, 97112- Neuromuscular re-education, 97535- Self Care, 32440- Manual therapy, [phone]- Gait training, [phone]- Orthotic Fit/training, [phone]- Canalith repositioning, [id number]- Aquatic Therapy, [id number]- Electrical stimulation (unattended), [phone]- Ionotophoresis 4mg /ml Dexamethasone , Patient/Family education, Balance training, Stair training, Taping, Dry Needling, Joint mobilization, Joint manipulation, Spinal manipulation, Spinal  mobilization, Cryotherapy, and Moist heat   PLAN FOR NEXT SESSION: Follow-up on cupping/manual at last session. Hip and core strengthening, continue assessing for movement preferences,  continue progressing home program. Manual as needed.   Flint Hummer, PTA 07/25/2023, 12:41 PM

## 2023-07-27 ENCOUNTER — Other Ambulatory Visit: Payer: Self-pay | Admitting: Physician Assistant

## 2023-07-27 ENCOUNTER — Ambulatory Visit: Admitting: Rehabilitative and Restorative Service Providers"

## 2023-07-27 ENCOUNTER — Encounter: Payer: Self-pay | Admitting: Rehabilitative and Restorative Service Providers"

## 2023-07-27 ENCOUNTER — Encounter: Payer: Self-pay | Admitting: Physician Assistant

## 2023-07-27 DIAGNOSIS — M79651 Pain in right thigh: Secondary | ICD-10-CM

## 2023-07-27 DIAGNOSIS — M5459 Other low back pain: Secondary | ICD-10-CM | POA: Diagnosis not present

## 2023-07-27 DIAGNOSIS — M5416 Radiculopathy, lumbar region: Secondary | ICD-10-CM | POA: Diagnosis not present

## 2023-07-27 DIAGNOSIS — R262 Difficulty in walking, not elsewhere classified: Secondary | ICD-10-CM | POA: Diagnosis not present

## 2023-07-27 MED ORDER — CELECOXIB 200 MG PO CAPS: 200.0000 mg | ORAL_CAPSULE | Freq: Two times a day (BID) | ORAL | 1 refills | Status: AC

## 2023-07-27 NOTE — Therapy (Signed)
 OUTPATIENT PHYSICAL THERAPY THORACOLUMBAR TREATMENT   Patient Name: Harold Edwards MRN: [id number] DOB:Jul 06, 1955, 68 y.o., male Today's Date: 07/27/2023  END OF SESSION:  PT End of Session - 07/27/23 0932     Visit Number 3    Number of Visits 24    Date for PT Re-Evaluation 10/06/23    Authorization Type [employer] MEDICARE  *PRIOR AUTH REQUIRED*    Progress Note Due on Visit 10    PT Start Time 0930    PT Stop Time 1015    PT Time Calculation (min) 45 min    Activity Tolerance Patient tolerated treatment well          Past Medical History:  Diagnosis Date   Back pain, chronic    Cancer (HCC)    Chronic cough 09/19/2017   High cholesterol    Influenza    Pneumonia    Prostate cancer (HCC) 02/21/2014   4+4=8 s/p TRUS biopsy by Dr. Ami Kail on 01/18/14  Last Assessment & Plan:   Patient be managed by urology.  Patient is status post prostatectomy and radiation therapy.  Denies hematuria.   Sinus tachycardia 09/13/2017   Stress incontinence 04/10/2014   Past Surgical History:  Procedure Laterality Date   ABDOMINAL SURGERY     PROSTATE SURGERY     Prostatectomy robot. 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   Acute metabolic encephalopathy    Aortic atherosclerosis (HCC) 04/28/2020   Senile purpura (HCC) 10/31/2018   Former smoker, stopped smoking in distant past 04/20/2018   Lumbar back pain with radiculopathy affecting right lower extremity 04/14/2018    Chronic cough 09/19/2017   Mixed hyperlipidemia 09/19/2017   Sinus tachycardia 09/13/2017   ED (erectile dysfunction) 04/10/2014   Stress incontinence 04/10/2014   S/P prostatectomy 03/18/2014   History of prostate cancer 02/21/2014    PCP: Sandy Crumb, PA-C  REFERRING PROVIDER: Waymond Hailey, MD  REFERRING DIAG: M54.16 (ICD-10-CM) - Radiculopathy, lumbar region   Rationale for Evaluation and Treatment: Rehabilitation  THERAPY DIAG:  Difficulty in walking, not elsewhere classified  Other low back pain  Pain in right thigh  ONSET DATE: Chronic (referral date 07/07/2023)  SUBJECTIVE:  SUBJECTIVE STATEMENT: Patient reports that the pain in the pain in the LB has not changed since surgery. The surgery took care of the R thigh pain but not the R hip pain. He has continued pain in the Lt side of low back and Rt and down lateral Rt thigh. The treatment last visit helped for a while but pain was right back .   EVAL: The patient stated he has had low back pain for over 30 years and has been seen at this clinic a number of years ago for low back pain.. Most recently, he underwent a microdiscectomy procedure about 3-4 months ago, which helped with pain and paresthesias he was experiencing at his R anterior thigh. However, he has more recently started to experience L side low back pain as well as pain down the back of his R leg. The patient reported that this pain is worse with prolonged sitting, as well as prolonged weightbearing, like standing or walking. The other day, he was walking around [employer] and he could only make it half way through the store before he was experiencing significant low back and R leg pain. No position completely relieves the pain when resting. Ice and stretching help temporarily but the  pain comes back to the same levels. He was prescribed a muscle relaxer, but he does not feel this helps much.   PERTINENT HISTORY:  No additional pertinent history described by the patient.  PAIN:  Are you having pain? Yes: NPRS scale: Current 5/10 at L low back, 5-6/10 R leg; 10/10 worse Pain location: See above Pain description: Sharp, ache Aggravating factors: Walking, activity, sitting Relieving factors: Ice, stretching (temporary)  PRECAUTIONS: None  RED FLAGS: None   WEIGHT BEARING RESTRICTIONS: No  FALLS:  Has patient fallen in last 6 months? No  PATIENT GOALS: Reduce pain, improve function   OBJECTIVE:  Note: Objective measures were completed at Evaluation unless otherwise noted.  DIAGNOSTIC FINDINGS:  From medical record, 08/23/2022:  CLINICAL DATA:  Low back pain with history of trauma. Right radiculopathy, worsening.   EXAM: MRI LUMBAR SPINE WITHOUT CONTRAST   TECHNIQUE: Multiplanar, multisequence MR imaging of the lumbar spine was performed. No intravenous contrast was administered.   COMPARISON:  08/27/2018   FINDINGS: Segmentation:  Standard.   Alignment:  Physiologic.   Vertebrae:  No fracture, evidence of discitis, or bone lesion.   Conus medullaris and cauda equina: Conus extends to the L1 level. Conus and cauda equina appear normal.   Paraspinal and other soft tissues: Negative for perispinal mass or inflammation.   Disc levels:   T12- L1: Posterior annular fissure.  No impingement   L1-L2: Mild disc narrowing and bulging with no neural impingement   L2-L3: Right paracentral to foraminal extrusion with upward migration. Advanced right L2 impingement. Disc is narrowed and bulging eccentric to the right. Mild facet spurring.   L3-L4: Disc narrowing and bulging with right foraminal annular fissure. Degenerative facet spurring on both sides. No neural compression.   L4-L5: Near complete disc collapse with endplate ridging and  disc bulging. Mild to moderate degenerative facet spurring. Mild to moderate bilateral foraminal narrowing   L5-S1:Disc collapse with endplate ridging and small chronic herniation which is downward pointing and central. Negative facets. Mild bilateral foraminal narrowing, no neural compression.   IMPRESSION: 1. Symptomatic finding is likely at L2-3 where there is a right paracentral to foraminal extrusion with upward migration. Pronounced right L2 impingement. 2. Disc and facet degeneration at L3-4 and below is  similar to 2020 with mild to moderate foraminal narrowing bilaterally at L4-5  PATIENT SURVEYS:  Patient-specific activity scoring scheme (Point to one number):  0 represents "unable to perform." 10 represents "able to perform at prior level. Activity Initial  Activity Eval     Walking 6    Sitting 6    Total: 12    Minimum detectable change (90%CI) for average score = 2 points Minimum detectable change (90%CI) for single activity score = 3 points PSFS developed by: [place] Spitz., & Binkley, J. (1995). Assessing disability and change on individual patients: a report of a patient specific measure. Physiotherapy [place], [phone], [phone]. Reproduced with the permission of the authors  PALPATION: -No notable pain reporduced with palpation of the R or L lumbar paraspinals or R or L posterolateral hip musculature -Some tenderness with PA glides at the lumbar spine, not no major pain.  LUMBAR ROM:   AROM eval  Flexion Top of feet, pain across pelvis   Extension 5 degrees, L side low back pain  Right lateral flexion Reaches mid-thigh, low back pain  Left lateral flexion Reaches mid-thigh, low back pain  Right rotation   Left rotation    (Blank rows = not tested)  LOWER EXTREMITY ROM:     Passive  Right eval Left eval  Hip flexion WNL WNL  Hip extension WNL WNL  Hip abduction WNL WNL  Hip adduction    Hip internal rotation at 90 deg flexion  WNL WNL  Hip external rotation at 90 deg flexion 45 25  Knee flexion WNL WNL  Knee extension WNL WNL  Ankle dorsiflexion    Ankle plantarflexion    Ankle inversion    Ankle eversion     (Blank rows = not tested)  LOWER EXTREMITY MMT:    MMT Right eval Left eval  Trunk rotation in hooklying, testing through lower extremities 3+** 4*  Trunk rotation in hooklying, testing through upper extremities 3+** 4*  Hip flexion 4* 4*  Hip extension 4* 4*  Hip abduction 4* 4*  Hip adduction 4** 4**  Hip internal rotation 5* 5*  Hip external rotation 4* 4*  Knee flexion 5* 5*  Knee extension 5* 5**  Ankle dorsiflexion    Ankle plantarflexion    Ankle inversion    Ankle eversion     (Blank rows = not tested)  *= low back pain **= significant low back pain  LUMBAR SPECIAL TESTS:  Straight leg raise test: Negative bilaterally    [facility] Adult PT Treatment:                                                DATE: 07/27/2023 Therapeutic Exercise: Nustep L 5 x 4 min some increased pain R hip  Manual Therapy: IASTM lumbar paraspinals Cupping --> gliding bilateral; static cups Lt lumbar Neuromuscular re-ed: Diaphragmatic breathing in hooklying with frequent cues for breathing mechanics and alleviating postural tension Therapeutic activity:   4 part core - stepping each each part up - difficulty with multifidi will start with other three parts for core activation  Piriformis stretch supine 30 sec x 3 R/L   LE rock - windshield wiper x 10 R/L feet apart   Supine hooklying shoulder flexion red TB btn hands core engaged x 10  Self care:   Myofacial ball release work standing  4 inch plastic ball   Continued education re transfers, transitional movements with good body mechanics   [facility] Adult PT Treatment:                                                DATE: 07/25/2023 Therapeutic Exercise: HEP review Prone + pillow under stomach: Double leg wipers --> discomfort when feet go left Lt single leg  hip passive IR + pin & stretch at lumbar paraspinal Manual Therapy: IASTM lumbar paraspinals Cupping --> gliding bilateral; static cups Lt lumbar Neuromuscular re-ed: Diaphragmatic breathing in hooklying with frequent cues for breathing mechanics and alleviating postural tension Hooklying hip add isometric -> flared up Rt  Hooklying hip abd isometric --> flared up Rt Quadruped diaphragmatic breathing + tactile cues  Quadruped rocking     [facility] Adult PT Treatment:                                                DATE: 07/14/2023 Therapeutic Exercise: Instructed home program (see below)  PATIENT EDUCATION:  Education details: on current presentation, on HEP, on clinical outcomes score and POC Person educated: Patient Education method: Explanation, Demonstration, and Handouts Education comprehension: verbalized understanding   HOME EXERCISE PROGRAM: Access Code: [email or web address] URL: [email or web address] Date: 07/27/2023 Prepared by: Megha Agnes  Exercises - Hooklying hip extension + hip adduction + shoulder flexion isometric  - 1 x daily - 7 x weekly - 5 sets - 10 sec hold - Supine Diaphragmatic Breathing  - 1 x daily - 7 x weekly - 3 sets - 10 reps - Quadruped Rocking Slow  - 1 x daily - 7 x weekly - 3 sets - 10 reps - Supine Piriformis Stretch with Leg Straight  - 2 x daily - 7 x weekly - 1 sets - 3 reps - 30 sec  hold - Supine Transversus Abdominis Bracing with Pelvic Floor Contraction  - 2 x daily - 7 x weekly - 1 sets - 10 reps - 10sec  hold - Supine Shoulder Flexion with Dowel  - 2 x daily - 7 x weekly - 1 sets - 5-10 reps - 3 sec  hold - Standing Piriformis Release with Ball at Wall  - 2 x daily - 7 x weekly - 30-60 sec  hold  ASSESSMENT:  CLINICAL IMPRESSION: Continued pain in the R hip and LB. Patient has tightness through the piriformis R > L. Testing of piriformis reproduces R hip pain. Added piriformis stretch in supine to protect lumbar spine. Added core  stabilization in supine. Myofacial ball release posterior hip in standing. Body mechanics including bed mobility reviewed for log rolling to alleviate spinal tension/pain, avoiding reaching forward in sitting. May benefit from trial of DN posterior R hip.   Patient is a 68 y.o. who was seen today for physical therapy evaluation and treatment for acute on chronic low back pain. Limitations in trunk range of motion were noted, particularly into extension and bilateral side flexion. Hip range of motion was generally good, but testing strength of the hip musculature did cause low back pain. Weakness was noted, but may be related to pain inhibition. Straight leg raise testing was negative bilaterally. Assessed for movement preferences for lower extremity and upper  extremity movements. Patient responded better to resisted hip extension compared to hip flexion, resisted hip internal rotation/adduction compared to external rotation/abduction, and resisted shoulder flexion compared to extension. Provided a home exercises incorporating these movements which patient reported to provide some relief upon standing after performance. Will continue to assess for directional preferences, as well as generally building hip and core strength. Physical therapy is indicated.   OBJECTIVE IMPAIRMENTS: decreased mobility, difficulty walking, decreased ROM, decreased strength, and pain.   REHAB POTENTIAL: Fair 30 year history of pain  CLINICAL DECISION MAKING: Evolving/moderate complexity  EVALUATION COMPLEXITY: Moderate   GOALS: Goals reviewed with patient? yes  SHORT TERM GOALS: 08/25/2023  Patient will be independent in self management strategies to improve quality of life and functional outcomes. Baseline: New Program Goal status: INITIAL  2.  Patient will report at least 25% improvement in overall symptoms and/or function to demonstrate improved functional mobility Baseline: 0% better Goal status: INITIAL  3.   Patient will improve R and L trunk side flexion to reach at least the distal 1/3 of the thigh to demonstrate improved spinal mobility. Baseline: mid-thigh R and L Goal status: INITIAL  LONG TERM GOALS: 10/06/2023  Patient will report at least 50% improvement in overall symptoms and/or function to demonstrate improved functional mobility Baseline: 0% better Goal status: INITIAL  2.  Patient will walking item on the Patient Specific Functional Scale to by at least 2 points. Baseline: 6 points Goal status: INITIAL  3.  Patient will sitting item on the Patient Specific Functional Scale to by at least 2 points. Baseline: 6 points Goal status: INITIAL  PLAN:  PT FREQUENCY: 2x/week  PT DURATION: 12 weeks  PLANNED INTERVENTIONS: 97110-Therapeutic exercises, 97530- Therapeutic activity, [id number]- Neuromuscular re-education, 97535- Self Care, [id number]- Manual therapy, [phone]- Gait training, [phone]- Orthotic Fit/training, [phone]- Canalith repositioning, [id number]- Aquatic Therapy, [id number]- Electrical stimulation (unattended), [phone]- Ionotophoresis 4mg /ml Dexamethasone , Patient/Family education, Balance training, Stair training, Taping, Dry Needling, Joint mobilization, Joint manipulation, Spinal manipulation, Spinal mobilization, Cryotherapy, and Moist heat   PLAN FOR NEXT SESSION: Follow-up on cupping/manual, myofacial release and additional exercises addressing piriformis tightness. Hip and core strengthening, continue assessing for movement preferences, continue progressing home program. Manual as needed; consider DN R posterior hip.   Seleena Reimers P Gregori Abril, PT 07/27/2023, 9:32 AM

## 2023-07-28 ENCOUNTER — Ambulatory Visit

## 2023-07-29 ENCOUNTER — Ambulatory Visit: Admitting: Rehabilitative and Restorative Service Providers"

## 2023-07-29 ENCOUNTER — Encounter: Payer: Self-pay | Admitting: Rehabilitative and Restorative Service Providers"

## 2023-07-29 DIAGNOSIS — M5459 Other low back pain: Secondary | ICD-10-CM | POA: Diagnosis not present

## 2023-07-29 DIAGNOSIS — M79651 Pain in right thigh: Secondary | ICD-10-CM | POA: Diagnosis not present

## 2023-07-29 DIAGNOSIS — R262 Difficulty in walking, not elsewhere classified: Secondary | ICD-10-CM

## 2023-07-29 DIAGNOSIS — M5416 Radiculopathy, lumbar region: Secondary | ICD-10-CM | POA: Diagnosis not present

## 2023-07-29 NOTE — Therapy (Signed)
 OUTPATIENT PHYSICAL THERAPY THORACOLUMBAR TREATMENT   Patient Name: Harold Edwards MRN: [id number] DOB:17-Dec-1955, 68 y.o., male Today's Date: 07/29/2023  END OF SESSION:  PT End of Session - 07/29/23 0804     Visit Number 4    Number of Visits 24    Date for PT Re-Evaluation 10/06/23    Authorization Type [employer] MEDICARE  *PRIOR AUTH REQUIRED*    Progress Note Due on Visit 10    PT Start Time 0804    PT Stop Time 0845    PT Time Calculation (min) 41 min    Activity Tolerance Patient tolerated treatment well    Behavior During Therapy [facility] for tasks assessed/performed          Past Medical History:  Diagnosis Date   Back pain, chronic    Cancer (HCC)    Chronic cough 09/19/2017   High cholesterol    Influenza    Pneumonia    Prostate cancer (HCC) 02/21/2014   4+4=8 s/p TRUS biopsy by Dr. Ami Kail on 01/18/14  Last Assessment & Plan:   Patient be managed by urology.  Patient is status post prostatectomy and radiation therapy.  Denies hematuria.   Sinus tachycardia 09/13/2017   Stress incontinence 04/10/2014   Past Surgical History:  Procedure Laterality Date   ABDOMINAL SURGERY     PROSTATE SURGERY     Prostatectomy robot. 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   Acute metabolic encephalopathy    Aortic atherosclerosis (HCC) 04/28/2020   Senile purpura (HCC) 10/31/2018   Former smoker, stopped smoking in distant past 04/20/2018   Lumbar back pain with  radiculopathy affecting right lower extremity 04/14/2018   Chronic cough 09/19/2017   Mixed hyperlipidemia 09/19/2017   Sinus tachycardia 09/13/2017   ED (erectile dysfunction) 04/10/2014   Stress incontinence 04/10/2014   S/P prostatectomy 03/18/2014   History of prostate cancer 02/21/2014    PCP: Sandy Crumb, PA-C REFERRING PROVIDER: Waymond Hailey, MD REFERRING DIAG: M54.16 (ICD-10-CM) - Radiculopathy, lumbar region  Rationale for Evaluation and Treatment: Rehabilitation  THERAPY DIAG:  Difficulty in walking, not elsewhere classified  Other low back pain  ONSET DATE: Chronic (referral date 07/07/2023)  SUBJECTIVE:  SUBJECTIVE STATEMENT: The patient arrived today (missed appt yesterday) and we were able to see him. He is noticing less tightness with exercises. He continues with day to day pain, but does feel he is able to walk longer.  EVAL: The patient stated he has had low back pain for over 30 years and has been seen at this clinic a number of years ago for low back pain.. Most recently, he underwent a microdiscectomy procedure about 3-4 months ago, which helped with pain and paresthesias he was experiencing at his R anterior thigh. However, he has more recently started to experience L side low back pain as well as pain down the back of his R leg. The patient reported that this pain is worse with prolonged sitting, as well as prolonged weightbearing, like standing or walking. The other day, he was walking around [employer] and he could only make it half way through the store before he was experiencing significant low back and R leg pain. No position completely relieves the pain when resting. Ice and stretching help temporarily but the pain comes back to the same levels. He was prescribed a muscle relaxer,  but he does not feel this helps much.   PERTINENT HISTORY:  No additional pertinent history described by the patient.  PAIN:  Are you having pain? Yes: NPRS scale: Current 4/10 at L low back, 4/10 R leg; 10/10 worse Pain location: See above Pain description: Sharp, ache Aggravating factors: Walking, activity, sitting Relieving factors: Ice, stretching (temporary)  PRECAUTIONS: None  WEIGHT BEARING RESTRICTIONS: No  FALLS:  Has patient fallen in last 6 months? No  PATIENT GOALS: Reduce pain, improve function   OBJECTIVE:  Note: Objective measures were completed at Evaluation unless otherwise noted.  DIAGNOSTIC FINDINGS:  From medical record, 08/23/2022:  CLINICAL DATA:  Low back pain with history of trauma. Right radiculopathy, worsening.   EXAM: MRI LUMBAR SPINE WITHOUT CONTRAST   TECHNIQUE: Multiplanar, multisequence MR imaging of the lumbar spine was performed. No intravenous contrast was administered.   COMPARISON:  08/27/2018   FINDINGS: Segmentation:  Standard.   Alignment:  Physiologic.   Vertebrae:  No fracture, evidence of discitis, or bone lesion.   Conus medullaris and cauda equina: Conus extends to the L1 level. Conus and cauda equina appear normal.   Paraspinal and other soft tissues: Negative for perispinal mass or inflammation.   Disc levels:   T12- L1: Posterior annular fissure.  No impingement   L1-L2: Mild disc narrowing and bulging with no neural impingement   L2-L3: Right paracentral to foraminal extrusion with upward migration. Advanced right L2 impingement. Disc is narrowed and bulging eccentric to the right. Mild facet spurring.   L3-L4: Disc narrowing and bulging with right foraminal annular fissure. Degenerative facet spurring on both sides. No neural compression.   L4-L5: Near complete disc collapse with endplate ridging and disc bulging. Mild to moderate degenerative facet spurring. Mild to moderate bilateral foraminal  narrowing   L5-S1:Disc collapse with endplate ridging and small chronic herniation which is downward pointing and central. Negative facets. Mild bilateral foraminal narrowing, no neural compression.   IMPRESSION: 1. Symptomatic finding is likely at L2-3 where there is a right paracentral to foraminal extrusion with upward migration. Pronounced right L2 impingement. 2. Disc and facet degeneration at L3-4 and below is similar to 2020 with mild to moderate foraminal narrowing bilaterally at L4-5  PATIENT SURVEYS:  Patient-specific activity scoring scheme (Point to one number):  0 represents "unable to perform." 10 represents "  able to perform at prior level. Activity Initial  Activity Eval     Walking 6    Sitting 6    Total: 12    Minimum detectable change (90%CI) for average score = 2 points Minimum detectable change (90%CI) for single activity score = 3 points PSFS developed by: [place] Spitz., & Binkley, J. (1995). Assessing disability and change on individual patients: a report of a patient specific measure. Physiotherapy [place], [phone], [phone]. Reproduced with the permission of the authors  PALPATION: -No notable pain reporduced with palpation of the R or L lumbar paraspinals or R or L posterolateral hip musculature -Some tenderness with PA glides at the lumbar spine, not no major pain.  LUMBAR ROM:   AROM eval  Flexion Top of feet, pain across pelvis   Extension 5 degrees, L side low back pain  Right lateral flexion Reaches mid-thigh, low back pain  Left lateral flexion Reaches mid-thigh, low back pain  Right rotation   Left rotation    (Blank rows = not tested)  LOWER EXTREMITY ROM:     Passive  Right eval Left eval  Hip flexion WNL WNL  Hip extension WNL WNL  Hip abduction WNL WNL  Hip adduction    Hip internal rotation at 90 deg flexion WNL WNL  Hip external rotation at 90 deg flexion 45 25  Knee flexion WNL WNL  Knee extension WNL  WNL  Ankle dorsiflexion    Ankle plantarflexion    Ankle inversion    Ankle eversion     (Blank rows = not tested)  LOWER EXTREMITY MMT:    MMT Right eval Left eval  Trunk rotation in hooklying, testing through lower extremities 3+** 4*  Trunk rotation in hooklying, testing through upper extremities 3+** 4*  Hip flexion 4* 4*  Hip extension 4* 4*  Hip abduction 4* 4*  Hip adduction 4** 4**  Hip internal rotation 5* 5*  Hip external rotation 4* 4*  Knee flexion 5* 5*  Knee extension 5* 5**  Ankle dorsiflexion    Ankle plantarflexion    Ankle inversion    Ankle eversion     (Blank rows = not tested)  *= low back pain **= significant low back pain  LUMBAR SPECIAL TESTS:  Straight leg raise test: Negative bilaterally   [facility] Adult PT Treatment:                                                DATE: 07/29/23 Therapeutic Exercise: Supine Piriformis stretch R side HS stretch band R and L IT band stretch R and L Quadriped Primal push up x 10 reps Hip extension x 10 reps Cat/cow with mod tactile, verbal, and demo cues x 10 reps Prone Press ups x 10 reps within tolerable range Stretch on elbows with spinal lengthening 8 point plank x 5 reps Hip IR/ER with passive overpressure Seated On physiodisc performing pelvic tilt anterior/posterior with tactile and demo cues x 5 reps Painful throughout available ROM worse on the L side and radiates across his back L lumbar stretch leaning to R elbow Manual Therapy: STM lumbar paraspinals, thoracic paraspinals Joint mobilization PA mobilization of lumbar spine, low thoracic, and lumbosacral region grade II-III   [facility] Adult PT Treatment:  DATE: 07/27/2023 Therapeutic Exercise: Nustep L 5 x 4 min some increased pain R hip  Manual Therapy: IASTM lumbar paraspinals Cupping --> gliding bilateral; static cups Lt lumbar Neuromuscular re-ed: Diaphragmatic breathing in hooklying with  frequent cues for breathing mechanics and alleviating postural tension Therapeutic activity:   4 part core - stepping each each part up - difficulty with multifidi will start with other three parts for core activation  Piriformis stretch supine 30 sec x 3 R/L   LE rock - windshield wiper x 10 R/L feet apart   Supine hooklying shoulder flexion red TB btn hands core engaged x 10  Self care:   Myofacial ball release work standing 4 inch plastic ball   Continued education re transfers, transitional movements with good body mechanics   [facility] Adult PT Treatment:                                                DATE: 07/25/2023 Therapeutic Exercise: HEP review Prone + pillow under stomach: Double leg wipers --> discomfort when feet go left Lt single leg hip passive IR + pin & stretch at lumbar paraspinal Manual Therapy: IASTM lumbar paraspinals Cupping --> gliding bilateral; static cups Lt lumbar Neuromuscular re-ed: Diaphragmatic breathing in hooklying with frequent cues for breathing mechanics and alleviating postural tension Hooklying hip add isometric -> flared up Rt  Hooklying hip abd isometric --> flared up Rt Quadruped diaphragmatic breathing + tactile cues  Quadruped rocking    [facility] Adult PT Treatment:                                                DATE: 07/14/2023 Therapeutic Exercise: Instructed home program (see below)  PATIENT EDUCATION:  Education details: on current presentation, on HEP, on clinical outcomes score and POC Person educated: Patient Education method: Explanation, Demonstration, and Handouts Education comprehension: verbalized understanding   HOME EXERCISE PROGRAM: Access Code: [email or web address] URL: [email or web address] Date: 07/27/2023 Prepared by: Celyn Holt  Exercises - Hooklying hip extension + hip adduction + shoulder flexion isometric  - 1 x daily - 7 x weekly - 5 sets - 10 sec hold - Supine Diaphragmatic Breathing  - 1 x daily - 7 x weekly -  3 sets - 10 reps - Quadruped Rocking Slow  - 1 x daily - 7 x weekly - 3 sets - 10 reps - Supine Piriformis Stretch with Leg Straight  - 2 x daily - 7 x weekly - 1 sets - 3 reps - 30 sec  hold - Supine Transversus Abdominis Bracing with Pelvic Floor Contraction  - 2 x daily - 7 x weekly - 1 sets - 10 reps - 10sec  hold - Supine Shoulder Flexion with Dowel  - 2 x daily - 7 x weekly - 1 sets - 5-10 reps - 3 sec  hold - Standing Piriformis Release with Ball at Wall  - 2 x daily - 7 x weekly - 30-60 sec  hold  ASSESSMENT:  CLINICAL IMPRESSION: The patient notes increased mobility after session (less tightness). He has diminished ROM in lumbar spine and PT continuing to work on both flexion and extension ROM. PT progressed to increase activation of core during plank and primal push up. Patient  tolerated well.   EVAL: Patient is a 68 y.o. who was seen today for physical therapy evaluation and treatment for acute on chronic low back pain. Limitations in trunk range of motion were noted, particularly into extension and bilateral side flexion. Hip range of motion was generally good, but testing strength of the hip musculature did cause low back pain. Weakness was noted, but may be related to pain inhibition. Straight leg raise testing was negative bilaterally. Assessed for movement preferences for lower extremity and upper extremity movements. Patient responded better to resisted hip extension compared to hip flexion, resisted hip internal rotation/adduction compared to external rotation/abduction, and resisted shoulder flexion compared to extension. Provided a home exercises incorporating these movements which patient reported to provide some relief upon standing after performance. Will continue to assess for directional preferences, as well as generally building hip and core strength. Physical therapy is indicated.   GOALS: Goals reviewed with patient? yes  SHORT TERM GOALS: 08/25/2023  Patient will be  independent in self management strategies to improve quality of life and functional outcomes. Baseline: New Program Goal status: INITIAL  2.  Patient will report at least 25% improvement in overall symptoms and/or function to demonstrate improved functional mobility Baseline: 0% better Goal status: INITIAL  3.  Patient will improve R and L trunk side flexion to reach at least the distal 1/3 of the thigh to demonstrate improved spinal mobility. Baseline: mid-thigh R and L Goal status: INITIAL  LONG TERM GOALS: 10/06/2023  Patient will report at least 50% improvement in overall symptoms and/or function to demonstrate improved functional mobility Baseline: 0% better Goal status: INITIAL  2.  Patient will walking item on the Patient Specific Functional Scale to by at least 2 points. Baseline: 6 points Goal status: INITIAL  3.  Patient will sitting item on the Patient Specific Functional Scale to by at least 2 points. Baseline: 6 points Goal status: INITIAL  PLAN:  PT FREQUENCY: 2x/week  PT DURATION: 12 weeks  PLANNED INTERVENTIONS: 97110-Therapeutic exercises, 97530- Therapeutic activity, [id number]- Neuromuscular re-education, 97535- Self Care, 16109- Manual therapy, [phone]- Gait training, [phone]- Orthotic Fit/training, [phone]- Canalith repositioning, [id number]- Aquatic Therapy, [id number]- Electrical stimulation (unattended), [phone]- Ionotophoresis 4mg /ml Dexamethasone , Patient/Family education, Balance training, Stair training, Taping, Dry Needling, Joint mobilization, Joint manipulation, Spinal manipulation, Spinal mobilization, Cryotherapy, and Moist heat   PLAN FOR NEXT SESSION: Follow-up on cupping/manual, myofacial release and additional exercises addressing piriformis tightness. Hip and core strengthening, continue assessing for movement preferences, continue progressing home program. Manual as needed; consider DN R posterior hip.   Dmonte Maher, PT 07/29/2023, 8:05 AM

## 2023-08-04 DIAGNOSIS — H6091 Unspecified otitis externa, right ear: Secondary | ICD-10-CM | POA: Diagnosis not present

## 2023-08-08 ENCOUNTER — Encounter

## 2023-08-10 ENCOUNTER — Ambulatory Visit: Attending: Neurological Surgery

## 2023-08-10 DIAGNOSIS — R262 Difficulty in walking, not elsewhere classified: Secondary | ICD-10-CM | POA: Diagnosis not present

## 2023-08-10 DIAGNOSIS — M5459 Other low back pain: Secondary | ICD-10-CM | POA: Diagnosis not present

## 2023-08-10 DIAGNOSIS — M79651 Pain in right thigh: Secondary | ICD-10-CM | POA: Insufficient documentation

## 2023-08-10 NOTE — Therapy (Signed)
 OUTPATIENT PHYSICAL THERAPY THORACOLUMBAR TREATMENT   Patient Name: Harold Edwards MRN: [id number] DOB:1955/09/05, 68 y.o., male Today's Date: 08/10/2023  END OF SESSION:  PT End of Session - 08/10/23 0759     Visit Number 5    Number of Visits 24    Date for PT Re-Evaluation 10/06/23    Authorization Type [employer] Medicare    Authorization Time Period 16 visits for 07/25/23 - 09/19/23    Progress Note Due on Visit 10    PT Start Time 0800    PT Stop Time 0848    PT Time Calculation (min) 48 min    Activity Tolerance Patient tolerated treatment well    Behavior During Therapy [facility] for tasks assessed/performed          Past Medical History:  Diagnosis Date   Back pain, chronic    Cancer (HCC)    Chronic cough 09/19/2017   High cholesterol    Influenza    Pneumonia    Prostate cancer (HCC) 02/21/2014   4+4=8 s/p TRUS biopsy by Dr. Shiela on 01/18/14  Last Assessment & Plan:   Patient be managed by urology.  Patient is status post prostatectomy and radiation therapy.  Denies hematuria.   Sinus tachycardia 09/13/2017   Stress incontinence 04/10/2014   Past Surgical History:  Procedure Laterality Date   ABDOMINAL SURGERY     PROSTATE SURGERY     Prostatectomy robot. 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   Acute metabolic encephalopathy    Aortic atherosclerosis (HCC) 04/28/2020   Senile purpura (HCC) 10/31/2018   Former smoker, stopped smoking in distant past  04/20/2018   Lumbar back pain with radiculopathy affecting right lower extremity 04/14/2018   Chronic cough 09/19/2017   Mixed hyperlipidemia 09/19/2017   Sinus tachycardia 09/13/2017   ED (erectile dysfunction) 04/10/2014   Stress incontinence 04/10/2014   S/P prostatectomy 03/18/2014   History of prostate cancer 02/21/2014    PCP: Vermell Bologna, PA-C REFERRING PROVIDER: Alm Molt, MD REFERRING DIAG: M54.16 (ICD-10-CM) - Radiculopathy, lumbar region  Rationale for Evaluation and Treatment: Rehabilitation  THERAPY DIAG:  Difficulty in walking, not elsewhere classified  Other low back pain  Pain in right thigh  ONSET DATE: Chronic (referral date 07/07/2023)  SUBJECTIVE:  SUBJECTIVE STATEMENT: Patient reports he has an inner ear infection and did not sleep well due to back pain going down R hip. Patient states just standing here my whole right leg is numb.  EVAL: The patient stated he has had low back pain for over 30 years and has been seen at this clinic a number of years ago for low back pain.. Most recently, he underwent a microdiscectomy procedure about 3-4 months ago, which helped with pain and paresthesias he was experiencing at his R anterior thigh. However, he has more recently started to experience L side low back pain as well as pain down the back of his R leg. The patient reported that this pain is worse with prolonged sitting, as well as prolonged weightbearing, like standing or walking. The other day, he was walking around [employer] and he could only make it half way through the store before he was experiencing significant low back and R leg pain. No position completely relieves the pain when resting. Ice and stretching help temporarily but the pain comes back to the same levels. He was  prescribed a muscle relaxer, but he does not feel this helps much.   PERTINENT HISTORY:  No additional pertinent history described by the patient.  PAIN:  Are you having pain? Yes: NPRS scale: Current 4/10 at L low back, 4/10 R leg; 10/10 worse Pain location: See above Pain description: Sharp, ache Aggravating factors: Walking, activity, sitting Relieving factors: Ice, stretching (temporary)  PRECAUTIONS: None  WEIGHT BEARING RESTRICTIONS: No  FALLS:  Has patient fallen in last 6 months? No  PATIENT GOALS: Reduce pain, improve function   OBJECTIVE:  Note: Objective measures were completed at Evaluation unless otherwise noted.  DIAGNOSTIC FINDINGS:  From medical record, 08/23/2022:  CLINICAL DATA:  Low back pain with history of trauma. Right radiculopathy, worsening.   EXAM: MRI LUMBAR SPINE WITHOUT CONTRAST   TECHNIQUE: Multiplanar, multisequence MR imaging of the lumbar spine was performed. No intravenous contrast was administered.   COMPARISON:  08/27/2018   FINDINGS: Segmentation:  Standard.   Alignment:  Physiologic.   Vertebrae:  No fracture, evidence of discitis, or bone lesion.   Conus medullaris and cauda equina: Conus extends to the L1 level. Conus and cauda equina appear normal.   Paraspinal and other soft tissues: Negative for perispinal mass or inflammation.   Disc levels:   T12- L1: Posterior annular fissure.  No impingement   L1-L2: Mild disc narrowing and bulging with no neural impingement   L2-L3: Right paracentral to foraminal extrusion with upward migration. Advanced right L2 impingement. Disc is narrowed and bulging eccentric to the right. Mild facet spurring.   L3-L4: Disc narrowing and bulging with right foraminal annular fissure. Degenerative facet spurring on both sides. No neural compression.   L4-L5: Near complete disc collapse with endplate ridging and disc bulging. Mild to moderate degenerative facet spurring. Mild  to moderate bilateral foraminal narrowing   L5-S1:Disc collapse with endplate ridging and small chronic herniation which is downward pointing and central. Negative facets. Mild bilateral foraminal narrowing, no neural compression.   IMPRESSION: 1. Symptomatic finding is likely at L2-3 where there is a right paracentral to foraminal extrusion with upward migration. Pronounced right L2 impingement. 2. Disc and facet degeneration at L3-4 and below is similar to 2020 with mild to moderate foraminal narrowing bilaterally at L4-5  PATIENT SURVEYS:  Patient-specific activity scoring scheme (Point to one number):  0 represents "unable to perform." 10 represents "able to perform at prior  level. Activity Initial  Activity Eval     Walking 6    Sitting 6    Total: 12    Minimum detectable change (90%CI) for average score = 2 points Minimum detectable change (90%CI) for single activity score = 3 points PSFS developed by: Rosalee MYRTIS Marvis KYM Charlet CHRISTELLA., & Binkley, J. (1995). Assessing disability and change on individual patients: a report of a patient specific measure. Physiotherapy [place], 47, [phone]. Reproduced with the permission of the authors  PALPATION: -No notable pain reporduced with palpation of the R or L lumbar paraspinals or R or L posterolateral hip musculature -Some tenderness with PA glides at the lumbar spine, not no major pain.  LUMBAR ROM:   AROM eval  Flexion Top of feet, pain across pelvis   Extension 5 degrees, L side low back pain  Right lateral flexion Reaches mid-thigh, low back pain  Left lateral flexion Reaches mid-thigh, low back pain  Right rotation   Left rotation    (Blank rows = not tested)  LOWER EXTREMITY ROM:     Passive  Right eval Left eval  Hip flexion WNL WNL  Hip extension WNL WNL  Hip abduction WNL WNL  Hip adduction    Hip internal rotation at 90 deg flexion WNL WNL  Hip external rotation at 90 deg flexion 45 25  Knee  flexion WNL WNL  Knee extension WNL WNL  Ankle dorsiflexion    Ankle plantarflexion    Ankle inversion    Ankle eversion     (Blank rows = not tested)  LOWER EXTREMITY MMT:    MMT Right eval Left eval  Trunk rotation in hooklying, testing through lower extremities 3+** 4*  Trunk rotation in hooklying, testing through upper extremities 3+** 4*  Hip flexion 4* 4*  Hip extension 4* 4*  Hip abduction 4* 4*  Hip adduction 4** 4**  Hip internal rotation 5* 5*  Hip external rotation 4* 4*  Knee flexion 5* 5*  Knee extension 5* 5**  Ankle dorsiflexion    Ankle plantarflexion    Ankle inversion    Ankle eversion     (Blank rows = not tested)  *= low back pain **= significant low back pain  LUMBAR SPECIAL TESTS:  Straight leg raise test: Negative bilaterally   [facility] Adult PT Treatment:                                                DATE: 08/10/2023 Therapeutic Exercise: Lateral pelvic glide towards L Supine figure 4 stretch (Lt) Prone prop on elbows stretch x 1 min LTR x 1 min HS & ITB stretches w/strap Manual Therapy: Cupping Lt lumbar paraspinals --> gliding & static cups Neuromuscular re-ed: Bridges  Core marching Prone press up x10 Quadruped rocking  Cat/cow for spinal articulation   [facility] Adult PT Treatment:                                                DATE: 07/29/23 Therapeutic Exercise: Supine Piriformis stretch R side HS stretch band R and L IT band stretch R and L Quadriped Primal push up x 10 reps Hip extension x 10 reps Cat/cow with mod tactile, verbal, and demo cues x  10 reps Prone Press ups x 10 reps within tolerable range Stretch on elbows with spinal lengthening 8 point plank x 5 reps Hip IR/ER with passive overpressure Seated On physiodisc performing pelvic tilt anterior/posterior with tactile and demo cues x 5 reps Painful throughout available ROM worse on the L side and radiates across his back L lumbar stretch leaning to R elbow Manual  Therapy: STM lumbar paraspinals, thoracic paraspinals Joint mobilization PA mobilization of lumbar spine, low thoracic, and lumbosacral region grade II-III   [facility] Adult PT Treatment:                                                DATE: 07/27/2023 Therapeutic Exercise: Nustep L 5 x 4 min some increased pain R hip  Manual Therapy: IASTM lumbar paraspinals Cupping --> gliding bilateral; static cups Lt lumbar Neuromuscular re-ed: Diaphragmatic breathing in hooklying with frequent cues for breathing mechanics and alleviating postural tension Therapeutic activity:   4 part core - stepping each each part up - difficulty with multifidi will start with other three parts for core activation  Piriformis stretch supine 30 sec x 3 R/L   LE rock - windshield wiper x 10 R/L feet apart   Supine hooklying shoulder flexion red TB btn hands core engaged x 10  Self care:   Myofacial ball release work standing 4 inch plastic ball   Continued education re transfers, transitional movements with good body mechanics     PATIENT EDUCATION:  Education details: on current presentation, on HEP, on clinical outcomes score and POC Person educated: Patient Education method: Programmer, multimedia, Facilities manager, and Handouts Education comprehension: verbalized understanding   HOME EXERCISE PROGRAM: Access Code: [email or web address] URL: [email or web address] Date: 08/10/2023 Prepared by: Lamarr Price  Exercises - Hooklying hip extension + hip adduction + shoulder flexion isometric  - 1 x daily - 7 x weekly - 5 sets - 10 sec hold - Supine Diaphragmatic Breathing  - 1 x daily - 7 x weekly - 3 sets - 10 reps - Quadruped Rocking Slow  - 1 x daily - 7 x weekly - 3 sets - 10 reps - Supine Piriformis Stretch with Leg Straight  - 2 x daily - 7 x weekly - 1 sets - 3 reps - 30 sec  hold - Supine Transversus Abdominis Bracing with Pelvic Floor Contraction  - 2 x daily - 7 x weekly - 1 sets - 10 reps - 10sec  hold - Supine  Shoulder Flexion with Dowel  - 2 x daily - 7 x weekly - 1 sets - 5-10 reps - 3 sec  hold - Standing Piriformis Release with Ball at [employer]  - 2 x daily - 7 x weekly - 30-60 sec  hold - Supine Figure 4 Piriformis Stretch  - 1 x daily - 7 x weekly - 3 sets - 10 reps - Supine Hamstring Stretch with Strap  - 1 x daily - 7 x weekly - 3 sets - 10 reps - Supine ITB Stretch with Strap  - 1 x daily - 7 x weekly - 3 sets - 10 reps - Cat Cow  - 1 x daily - 7 x weekly - 3 sets - 10 reps - Supine Bridge  - 1 x daily - 7 x weekly - 3 sets - 10 reps - Supine Lower Trunk Rotation  - 1 x daily -  7 x weekly - 3 sets - 10 reps  ASSESSMENT:  CLINICAL IMPRESSION: Patient better tolerated exercises after cupping treatment for myofascial decompression. Increased Lt lumbar discomfort with wide legged lateral trunk rotation; cueing legs together alleviated discomfort. Prone press up stretch progressed to dynamic press ups with no exacerbation of pain.   EVAL: Patient is a 68 y.o. who was seen today for physical therapy evaluation and treatment for acute on chronic low back pain. Limitations in trunk range of motion were noted, particularly into extension and bilateral side flexion. Hip range of motion was generally good, but testing strength of the hip musculature did cause low back pain. Weakness was noted, but may be related to pain inhibition. Straight leg raise testing was negative bilaterally. Assessed for movement preferences for lower extremity and upper extremity movements. Patient responded better to resisted hip extension compared to hip flexion, resisted hip internal rotation/adduction compared to external rotation/abduction, and resisted shoulder flexion compared to extension. Provided a home exercises incorporating these movements which patient reported to provide some relief upon standing after performance. Will continue to assess for directional preferences, as well as generally building hip and core strength.  Physical therapy is indicated.   GOALS: Goals reviewed with patient? yes  SHORT TERM GOALS: 08/25/2023  Patient will be independent in self management strategies to improve quality of life and functional outcomes. Baseline: New Program Goal status: INITIAL  2.  Patient will report at least 25% improvement in overall symptoms and/or function to demonstrate improved functional mobility Baseline: 0% better Goal status: INITIAL  3.  Patient will improve R and L trunk side flexion to reach at least the distal 1/3 of the thigh to demonstrate improved spinal mobility. Baseline: mid-thigh R and L Goal status: INITIAL  LONG TERM GOALS: 10/06/2023  Patient will report at least 50% improvement in overall symptoms and/or function to demonstrate improved functional mobility Baseline: 0% better Goal status: INITIAL  2.  Patient will walking item on the Patient Specific Functional Scale to by at least 2 points. Baseline: 6 points Goal status: INITIAL  3.  Patient will sitting item on the Patient Specific Functional Scale to by at least 2 points. Baseline: 6 points Goal status: INITIAL  PLAN:  PT FREQUENCY: 2x/week  PT DURATION: 12 weeks  PLANNED INTERVENTIONS: 97110-Therapeutic exercises, 97530- Therapeutic activity, [id number]- Neuromuscular re-education, 97535- Self Care, [id number]- Manual therapy, [phone]- Gait training, [phone]- Orthotic Fit/training, [phone]- Canalith repositioning, [id number]- Aquatic Therapy, [id number]- Electrical stimulation (unattended), [phone]- Ionotophoresis 4mg /ml Dexamethasone , Patient/Family education, Balance training, Stair training, Taping, Dry Needling, Joint mobilization, Joint manipulation, Spinal manipulation, Spinal mobilization, Cryotherapy, and Moist heat   PLAN FOR NEXT SESSION: Hip and core strengthening, continue assessing for movement preferences, continue progressing home program. Manual as needed; consider DN R posterior hip.   Lamarr GORMAN Price, PTA 08/10/2023, 8:56 AM

## 2023-08-15 ENCOUNTER — Ambulatory Visit

## 2023-08-15 DIAGNOSIS — M79651 Pain in right thigh: Secondary | ICD-10-CM | POA: Diagnosis not present

## 2023-08-15 DIAGNOSIS — M5459 Other low back pain: Secondary | ICD-10-CM | POA: Diagnosis not present

## 2023-08-15 DIAGNOSIS — R262 Difficulty in walking, not elsewhere classified: Secondary | ICD-10-CM

## 2023-08-15 NOTE — Therapy (Signed)
 OUTPATIENT PHYSICAL THERAPY THORACOLUMBAR TREATMENT   Patient Name: Harold Edwards MRN: [id number] DOB:February 22, 1955, 68 y.o., male Today's Date: 08/15/2023  END OF SESSION:  PT End of Session - 08/15/23 0800     Visit Number 6    Number of Visits 24    Date for PT Re-Evaluation 10/06/23    Authorization Type [employer]    Authorization Time Period 16 visits for 07/25/23 - 09/19/23    Authorization - Visit Number 6    Authorization - Number of Visits 16    Progress Note Due on Visit 10    PT Start Time 0800    PT Stop Time 0846    PT Time Calculation (min) 46 min    Activity Tolerance Patient tolerated treatment well    Behavior During Therapy [facility] for tasks assessed/performed         Past Medical History:  Diagnosis Date   Back pain, chronic    Cancer (HCC)    Chronic cough 09/19/2017   High cholesterol    Influenza    Pneumonia    Prostate cancer (HCC) 02/21/2014   4+4=8 s/p TRUS biopsy by Dr. Shiela on 01/18/14  Last Assessment & Plan:   Patient be managed by urology.  Patient is status post prostatectomy and radiation therapy.  Denies hematuria.   Sinus tachycardia 09/13/2017   Stress incontinence 04/10/2014   Past Surgical History:  Procedure Laterality Date   ABDOMINAL SURGERY     PROSTATE SURGERY     Prostatectomy robot. 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   Acute metabolic encephalopathy    Aortic atherosclerosis (HCC) 04/28/2020   Senile  purpura (HCC) 10/31/2018   Former smoker, stopped smoking in distant past 04/20/2018   Lumbar back pain with radiculopathy affecting right lower extremity 04/14/2018   Chronic cough 09/19/2017   Mixed hyperlipidemia 09/19/2017   Sinus tachycardia 09/13/2017   ED (erectile dysfunction) 04/10/2014   Stress incontinence 04/10/2014   S/P prostatectomy 03/18/2014   History of prostate cancer 02/21/2014    PCP: Vermell Bologna, PA-C REFERRING PROVIDER: Alm Molt, MD REFERRING DIAG: M54.16 (ICD-10-CM) - Radiculopathy, lumbar region  Rationale for Evaluation and Treatment: Rehabilitation  THERAPY DIAG:  Difficulty in walking, not elsewhere classified  Other low back pain  Pain in right thigh  ONSET DATE: Chronic (referral date 07/07/2023)  SUBJECTIVE:  SUBJECTIVE STATEMENT: Patient reports his is not having numbness down legs today, though the numbness continues to come on with walking or standing for a prolonged time. Patient states   EVAL: The patient stated he has had low back pain for over 30 years and has been seen at this clinic a number of years ago for low back pain.. Most recently, he underwent a microdiscectomy procedure about 3-4 months ago, which helped with pain and paresthesias he was experiencing at his R anterior thigh. However, he has more recently started to experience L side low back pain as well as pain down the back of his R leg. The patient reported that this pain is worse with prolonged sitting, as well as prolonged weightbearing, like standing or walking. The other day, he was walking around [employer] and he could only make it half way through the store before he was experiencing significant low back and R leg pain. No position completely relieves the pain when resting. Ice and stretching  help temporarily but the pain comes back to the same levels. He was prescribed a muscle relaxer, but he does not feel this helps much.   PERTINENT HISTORY:  No additional pertinent history described by the patient.  PAIN:  Are you having pain? Yes: NPRS scale: Current 4/10 at L low back, 4/10 R leg; 10/10 worse Pain location: See above Pain description: Sharp, ache Aggravating factors: Walking, activity, sitting Relieving factors: Ice, stretching (temporary)  PRECAUTIONS: None  WEIGHT BEARING RESTRICTIONS: No  FALLS:  Has patient fallen in last 6 months? No  PATIENT GOALS: Reduce pain, improve function   OBJECTIVE:  Note: Objective measures were completed at Evaluation unless otherwise noted.  DIAGNOSTIC FINDINGS:  From medical record, 08/23/2022:  CLINICAL DATA:  Low back pain with history of trauma. Right radiculopathy, worsening.   EXAM: MRI LUMBAR SPINE WITHOUT CONTRAST   TECHNIQUE: Multiplanar, multisequence MR imaging of the lumbar spine was performed. No intravenous contrast was administered.   COMPARISON:  08/27/2018   FINDINGS: Segmentation:  Standard.   Alignment:  Physiologic.   Vertebrae:  No fracture, evidence of discitis, or bone lesion.   Conus medullaris and cauda equina: Conus extends to the L1 level. Conus and cauda equina appear normal.   Paraspinal and other soft tissues: Negative for perispinal mass or inflammation.   Disc levels:   T12- L1: Posterior annular fissure.  No impingement   L1-L2: Mild disc narrowing and bulging with no neural impingement   L2-L3: Right paracentral to foraminal extrusion with upward migration. Advanced right L2 impingement. Disc is narrowed and bulging eccentric to the right. Mild facet spurring.   L3-L4: Disc narrowing and bulging with right foraminal annular fissure. Degenerative facet spurring on both sides. No neural compression.   L4-L5: Near complete disc collapse with endplate ridging and  disc bulging. Mild to moderate degenerative facet spurring. Mild to moderate bilateral foraminal narrowing   L5-S1:Disc collapse with endplate ridging and small chronic herniation which is downward pointing and central. Negative facets. Mild bilateral foraminal narrowing, no neural compression.   IMPRESSION: 1. Symptomatic finding is likely at L2-3 where there is a right paracentral to foraminal extrusion with upward migration. Pronounced right L2 impingement. 2. Disc and facet degeneration at L3-4 and below is similar to 2020 with mild to moderate foraminal narrowing bilaterally at L4-5  PATIENT SURVEYS:  Patient-specific activity scoring scheme (Point to one number):  0 represents "unable to perform." 10 represents "able to perform at prior level. Activity Initial  Activity Eval     Walking 6    Sitting 6    Total: 12    Minimum detectable change (90%CI) for average score = 2 points Minimum detectable change (90%CI) for single activity score = 3 points PSFS developed by: Rosalee MYRTIS Marvis KYM Charlet CHRISTELLA., & Binkley, J. (1995). Assessing disability and change on individual patients: a report of a patient specific measure. Physiotherapy [place], 47, [phone]. Reproduced with the permission of the authors  PALPATION: -No notable pain reporduced with palpation of the R or L lumbar paraspinals or R or L posterolateral hip musculature -Some tenderness with PA glides at the lumbar spine, not no major pain.  LUMBAR ROM:   AROM eval  Flexion Top of feet, pain across pelvis   Extension 5 degrees, L side low back pain  Right lateral flexion Reaches mid-thigh, low back pain  Left lateral flexion Reaches mid-thigh, low back pain  Right rotation   Left rotation    (Blank rows = not tested)  LOWER EXTREMITY ROM:     Passive  Right eval Left eval  Hip flexion WNL WNL  Hip extension WNL WNL  Hip abduction WNL WNL  Hip adduction    Hip internal rotation at 90 deg flexion  WNL WNL  Hip external rotation at 90 deg flexion 45 25  Knee flexion WNL WNL  Knee extension WNL WNL  Ankle dorsiflexion    Ankle plantarflexion    Ankle inversion    Ankle eversion     (Blank rows = not tested)  LOWER EXTREMITY MMT:    MMT Right eval Left eval  Trunk rotation in hooklying, testing through lower extremities 3+** 4*  Trunk rotation in hooklying, testing through upper extremities 3+** 4*  Hip flexion 4* 4*  Hip extension 4* 4*  Hip abduction 4* 4*  Hip adduction 4** 4**  Hip internal rotation 5* 5*  Hip external rotation 4* 4*  Knee flexion 5* 5*  Knee extension 5* 5**  Ankle dorsiflexion    Ankle plantarflexion    Ankle inversion    Ankle eversion     (Blank rows = not tested)  *= low back pain **= significant low back pain  LUMBAR SPECIAL TESTS:  Straight leg raise test: Negative bilaterally   [facility] Adult PT Treatment:                                                DATE: 08/15/2023 Therapeutic Exercise: Supine figure 4 stretch  Figure 4 LTR (small ROM) to Lt side  Seated thoracic extension stretch + yoga mat roll 10x10 Self-massage with 4 ball along Lt thoracolumbar paraspinals  Prone prop on elbows 2 x1 min SKTC Manual Therapy: IASTM (Lt) thoracolumbar paraspinals Static cups (Lt) lumbar + child's pose rocking Supine lumbar manual distraction x 1 min Neuromuscular re-ed: Standing lateral pelvic glide toward wall  Standing posterior pelvic tilt against wall x 5 Core marching  Unilateral 90/90 heel taps    [facility] Adult PT Treatment:                                                DATE: 08/10/2023 Therapeutic Exercise: Lateral pelvic glide towards L Supine figure 4 stretch (Lt) Prone  prop on elbows stretch x 1 min LTR x 1 min HS & ITB stretches w/strap Manual Therapy: Cupping Lt lumbar paraspinals --> gliding & static cups Neuromuscular re-ed: Bridges  Core marching Prone press up x10 Quadruped rocking  Cat/cow for spinal  articulation   [facility] Adult PT Treatment:                                                DATE: 07/29/23 Therapeutic Exercise: Supine Piriformis stretch R side HS stretch band R and L IT band stretch R and L Quadriped Primal push up x 10 reps Hip extension x 10 reps Cat/cow with mod tactile, verbal, and demo cues x 10 reps Prone Press ups x 10 reps within tolerable range Stretch on elbows with spinal lengthening 8 point plank x 5 reps Hip IR/ER with passive overpressure Seated On physiodisc performing pelvic tilt anterior/posterior with tactile and demo cues x 5 reps Painful throughout available ROM worse on the L side and radiates across his back L lumbar stretch leaning to R elbow Manual Therapy: STM lumbar paraspinals, thoracic paraspinals Joint mobilization PA mobilization of lumbar spine, low thoracic, and lumbosacral region grade II-III    PATIENT EDUCATION:  Education details: on current presentation, on HEP, on clinical outcomes score and POC Person educated: Patient Education method: Explanation, Demonstration, and Handouts Education comprehension: verbalized understanding   HOME EXERCISE PROGRAM: Access Code: [email or web address] URL: [email or web address] Date: 08/10/2023 Prepared by: Lamarr Price  Exercises - Hooklying hip extension + hip adduction + shoulder flexion isometric  - 1 x daily - 7 x weekly - 5 sets - 10 sec hold - Supine Diaphragmatic Breathing  - 1 x daily - 7 x weekly - 3 sets - 10 reps - Quadruped Rocking Slow  - 1 x daily - 7 x weekly - 3 sets - 10 reps - Supine Piriformis Stretch with Leg Straight  - 2 x daily - 7 x weekly - 1 sets - 3 reps - 30 sec  hold - Supine Transversus Abdominis Bracing with Pelvic Floor Contraction  - 2 x daily - 7 x weekly - 1 sets - 10 reps - 10sec  hold - Supine Shoulder Flexion with Dowel  - 2 x daily - 7 x weekly - 1 sets - 5-10 reps - 3 sec  hold - Standing Piriformis Release with Ball at [employer]  - 2 x daily - 7  x weekly - 30-60 sec  hold - Supine Figure 4 Piriformis Stretch  - 1 x daily - 7 x weekly - 3 sets - 10 reps - Supine Hamstring Stretch with Strap  - 1 x daily - 7 x weekly - 3 sets - 10 reps - Supine ITB Stretch with Strap  - 1 x daily - 7 x weekly - 3 sets - 10 reps - Cat Cow  - 1 x daily - 7 x weekly - 3 sets - 10 reps - Supine Bridge  - 1 x daily - 7 x weekly - 3 sets - 10 reps - Supine Lower Trunk Rotation  - 1 x daily - 7 x weekly - 3 sets - 10 reps  ASSESSMENT:  CLINICAL IMPRESSION: Initial stretching and alleviation in symptoms with exercises in supine and standing, however with repetition symptoms return (strain on Lt thoracolumbar paraspinals). Stretch along Lt side trunk/hip elicited with manual lumbar traction (  gentle), however around 1 minute mark patient reported pain and intervention discontinued. Core stabilizing exercises helped to decrease low back symptoms and patient reported less pain at end of session; recommended patient try prone prop on elbows stretch and core marching exercises to calm symptoms as they flare up.  EVAL: Patient is a 68 y.o. who was seen today for physical therapy evaluation and treatment for acute on chronic low back pain. Limitations in trunk range of motion were noted, particularly into extension and bilateral side flexion. Hip range of motion was generally good, but testing strength of the hip musculature did cause low back pain. Weakness was noted, but may be related to pain inhibition. Straight leg raise testing was negative bilaterally. Assessed for movement preferences for lower extremity and upper extremity movements. Patient responded better to resisted hip extension compared to hip flexion, resisted hip internal rotation/adduction compared to external rotation/abduction, and resisted shoulder flexion compared to extension. Provided a home exercises incorporating these movements which patient reported to provide some relief upon standing after  performance. Will continue to assess for directional preferences, as well as generally building hip and core strength. Physical therapy is indicated.   GOALS: Goals reviewed with patient? yes  SHORT TERM GOALS: 08/25/2023  Patient will be independent in self management strategies to improve quality of life and functional outcomes. Baseline: New Program Goal status: INITIAL  2.  Patient will report at least 25% improvement in overall symptoms and/or function to demonstrate improved functional mobility Baseline: 0% better Goal status: INITIAL  3.  Patient will improve R and L trunk side flexion to reach at least the distal 1/3 of the thigh to demonstrate improved spinal mobility. Baseline: mid-thigh R and L Goal status: INITIAL  LONG TERM GOALS: 10/06/2023  Patient will report at least 50% improvement in overall symptoms and/or function to demonstrate improved functional mobility Baseline: 0% better Goal status: INITIAL  2.  Patient will walking item on the Patient Specific Functional Scale to by at least 2 points. Baseline: 6 points Goal status: INITIAL  3.  Patient will sitting item on the Patient Specific Functional Scale to by at least 2 points. Baseline: 6 points Goal status: INITIAL  PLAN:  PT FREQUENCY: 2x/week  PT DURATION: 12 weeks  PLANNED INTERVENTIONS: 97110-Therapeutic exercises, 97530- Therapeutic activity, [id number]- Neuromuscular re-education, 97535- Self Care, [id number]- Manual therapy, [phone]- Gait training, [phone]- Orthotic Fit/training, [phone]- Canalith repositioning, [id number]- Aquatic Therapy, [id number]- Electrical stimulation (unattended), [phone]- Ionotophoresis 4mg /ml Dexamethasone , Patient/Family education, Balance training, Stair training, Taping, Dry Needling, Joint mobilization, Joint manipulation, Spinal manipulation, Spinal mobilization, Cryotherapy, and Moist heat   PLAN FOR NEXT SESSION: Hip and core strengthening, continue assessing for movement preferences, continue  progressing home program. Manual as needed; consider DN R posterior hip.   Lamarr GORMAN Price, PTA 08/15/2023, 8:47 AM

## 2023-08-17 ENCOUNTER — Ambulatory Visit

## 2023-08-17 DIAGNOSIS — M5459 Other low back pain: Secondary | ICD-10-CM | POA: Diagnosis not present

## 2023-08-17 DIAGNOSIS — R262 Difficulty in walking, not elsewhere classified: Secondary | ICD-10-CM | POA: Diagnosis not present

## 2023-08-17 DIAGNOSIS — M79651 Pain in right thigh: Secondary | ICD-10-CM

## 2023-08-17 NOTE — Therapy (Signed)
 OUTPATIENT PHYSICAL THERAPY THORACOLUMBAR TREATMENT   Patient Name: Harold Edwards MRN: [id number] DOB:1956/02/02, 68 y.o., male Today's Date: 08/17/2023  END OF SESSION:  PT End of Session - 08/17/23 0802     Visit Number 7    Number of Visits 24    Date for PT Re-Evaluation 10/06/23    Authorization Type [employer] Medicare    Authorization Time Period 16 visits for 07/25/23 - 09/19/23    Authorization - Visit Number 7    Authorization - Number of Visits 16    Progress Note Due on Visit 10    PT Start Time 0802    PT Stop Time 0843    PT Time Calculation (min) 41 min    Activity Tolerance Patient tolerated treatment well    Behavior During Therapy [facility] for tasks assessed/performed          Past Medical History:  Diagnosis Date   Back pain, chronic    Cancer (HCC)    Chronic cough 09/19/2017   High cholesterol    Influenza    Pneumonia    Prostate cancer (HCC) 02/21/2014   4+4=8 s/p TRUS biopsy by Dr. Shiela on 01/18/14  Last Assessment & Plan:   Patient be managed by urology.  Patient is status post prostatectomy and radiation therapy.  Denies hematuria.   Sinus tachycardia 09/13/2017   Stress incontinence 04/10/2014   Past Surgical History:  Procedure Laterality Date   ABDOMINAL SURGERY     PROSTATE SURGERY     Prostatectomy robot. 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   Acute metabolic encephalopathy    Aortic atherosclerosis (HCC) 04/28/2020   Senile  purpura (HCC) 10/31/2018   Former smoker, stopped smoking in distant past 04/20/2018   Lumbar back pain with radiculopathy affecting right lower extremity 04/14/2018   Chronic cough 09/19/2017   Mixed hyperlipidemia 09/19/2017   Sinus tachycardia 09/13/2017   ED (erectile dysfunction) 04/10/2014   Stress incontinence 04/10/2014   S/P prostatectomy 03/18/2014   History of prostate cancer 02/21/2014    PCP: Vermell Bologna, PA-C REFERRING PROVIDER: Alm Molt, MD REFERRING DIAG: M54.16 (ICD-10-CM) - Radiculopathy, lumbar region  Rationale for Evaluation and Treatment: Rehabilitation  THERAPY DIAG:  Difficulty in walking, not elsewhere classified  Other low back pain  Pain in right thigh  ONSET DATE: Chronic (referral date 07/07/2023)  SUBJECTIVE:  SUBJECTIVE STATEMENT: It seems to be helping my hip. He was standing at [employer] and then had tingling pain down the Rt lateral thigh after standing. The pain along the left side of the back is the same.   EVAL: The patient stated he has had low back pain for over 30 years and has been seen at this clinic a number of years ago for low back pain.. Most recently, he underwent a microdiscectomy procedure about 3-4 months ago, which helped with pain and paresthesias he was experiencing at his R anterior thigh. However, he has more recently started to experience L side low back pain as well as pain down the back of his R leg. The patient reported that this pain is worse with prolonged sitting, as well as prolonged weightbearing, like standing or walking. The other day, he was walking around [employer] and he could only make it half way through the store before he was experiencing significant low back and R leg pain. No position completely relieves the  pain when resting. Ice and stretching help temporarily but the pain comes back to the same levels. He was prescribed a muscle relaxer, but he does not feel this helps much.   PERTINENT HISTORY:  Microdiscectomy April 2025   PAIN:  Are you having pain? Yes: NPRS scale: 4/10 Pain location: Lt low/mid back Pain description: tight Aggravating factors: Walking, activity, sitting Relieving factors: Ice, stretching (temporary)  PRECAUTIONS: None  WEIGHT BEARING RESTRICTIONS: No  FALLS:  Has patient fallen in last 6 months? No  PATIENT GOALS: Reduce pain, improve function   OBJECTIVE:  Note: Objective measures were completed at Evaluation unless otherwise noted.  DIAGNOSTIC FINDINGS:  From medical record, 08/23/2022:  CLINICAL DATA:  Low back pain with history of trauma. Right radiculopathy, worsening.   EXAM: MRI LUMBAR SPINE WITHOUT CONTRAST   TECHNIQUE: Multiplanar, multisequence MR imaging of the lumbar spine was performed. No intravenous contrast was administered.   COMPARISON:  08/27/2018   FINDINGS: Segmentation:  Standard.   Alignment:  Physiologic.   Vertebrae:  No fracture, evidence of discitis, or bone lesion.   Conus medullaris and cauda equina: Conus extends to the L1 level. Conus and cauda equina appear normal.   Paraspinal and other soft tissues: Negative for perispinal mass or inflammation.   Disc levels:   T12- L1: Posterior annular fissure.  No impingement   L1-L2: Mild disc narrowing and bulging with no neural impingement   L2-L3: Right paracentral to foraminal extrusion with upward migration. Advanced right L2 impingement. Disc is narrowed and bulging eccentric to the right. Mild facet spurring.   L3-L4: Disc narrowing and bulging with right foraminal annular fissure. Degenerative facet spurring on both sides. No neural compression.   L4-L5: Near complete disc collapse with endplate ridging and disc bulging. Mild to moderate degenerative  facet spurring. Mild to moderate bilateral foraminal narrowing   L5-S1:Disc collapse with endplate ridging and small chronic herniation which is downward pointing and central. Negative facets. Mild bilateral foraminal narrowing, no neural compression.   IMPRESSION: 1. Symptomatic finding is likely at L2-3 where there is a right paracentral to foraminal extrusion with upward migration. Pronounced right L2 impingement. 2. Disc and facet degeneration at L3-4 and below is similar to 2020 with mild to moderate foraminal narrowing bilaterally at L4-5  PATIENT SURVEYS:  Patient-specific activity scoring scheme (Point to one number):  0 represents "unable to perform." 10 represents "able to perform at prior level. Activity Initial  Activity Eval  Walking 6    Sitting 6    Total: 12    Minimum detectable change (90%CI) for average score = 2 points Minimum detectable change (90%CI) for single activity score = 3 points PSFS developed by: Rosalee MYRTIS Marvis KYM Charlet CHRISTELLA., & Binkley, J. (1995). Assessing disability and change on individual patients: a report of a patient specific measure. Physiotherapy [place], 47, [phone]. Reproduced with the permission of the authors  PALPATION: -No notable pain reporduced with palpation of the R or L lumbar paraspinals or R or L posterolateral hip musculature -Some tenderness with PA glides at the lumbar spine, not no major pain.  LUMBAR ROM:   AROM eval 08/17/23  Flexion Top of feet, pain across pelvis  Full   Extension 5 degrees, L side low back pain 75% limited, Lt low/mid back pain   Right lateral flexion Reaches mid-thigh, low back pain 50% limited, Lt low/mid back pain  Left lateral flexion Reaches mid-thigh, low back pain 50% limited, mild Lt low/mid back pain  Right rotation    Left rotation     (Blank rows = not tested)  LOWER EXTREMITY ROM:     Passive  Right eval Left eval  Hip flexion WNL WNL  Hip extension WNL WNL  Hip  abduction WNL WNL  Hip adduction    Hip internal rotation at 90 deg flexion WNL WNL  Hip external rotation at 90 deg flexion 45 25  Knee flexion WNL WNL  Knee extension WNL WNL  Ankle dorsiflexion    Ankle plantarflexion    Ankle inversion    Ankle eversion     (Blank rows = not tested)  LOWER EXTREMITY MMT:    MMT Right eval Left eval  Trunk rotation in hooklying, testing through lower extremities 3+** 4*  Trunk rotation in hooklying, testing through upper extremities 3+** 4*  Hip flexion 4* 4*  Hip extension 4* 4*  Hip abduction 4* 4*  Hip adduction 4** 4**  Hip internal rotation 5* 5*  Hip external rotation 4* 4*  Knee flexion 5* 5*  Knee extension 5* 5**  Ankle dorsiflexion    Ankle plantarflexion    Ankle inversion    Ankle eversion     (Blank rows = not tested)  *= low back pain **= significant low back pain  LUMBAR SPECIAL TESTS:  Straight leg raise test: Negative bilaterally  [facility] Adult PT Treatment:                                                DATE: 08/17/23 Therapeutic Exercise: LTR with figure 4 x 1 minute each  Manual Therapy: SMT bilateral thoracolumbar paraspinals Rocktape to bilateral thoracolumbar paraspinals 50% tension I strip  Neuromuscular re-ed: Supine posterior pelvic tilt multiple reps, heavy cues Seated posterior pelvic tilt 2 x 10, heavy cues Sidelying hip abduction 2 x 10    [facility] Adult PT Treatment:                                                DATE: 08/15/2023 Therapeutic Exercise: Supine figure 4 stretch  Figure 4 LTR (small ROM) to Lt side  Seated thoracic extension stretch + yoga mat roll 10x10 Self-massage with 4 ball along  Lt thoracolumbar paraspinals  Prone prop on elbows 2 x1 min [facility] Manual Therapy: IASTM (Lt) thoracolumbar paraspinals Static cups (Lt) lumbar + child's pose rocking Supine lumbar manual distraction x 1 min Neuromuscular re-ed: Standing lateral pelvic glide toward wall  Standing posterior pelvic tilt  against wall x 5 Core marching  Unilateral 90/90 heel taps    [facility] Adult PT Treatment:                                                DATE: 08/10/2023 Therapeutic Exercise: Lateral pelvic glide towards L Supine figure 4 stretch (Lt) Prone prop on elbows stretch x 1 min LTR x 1 min HS & ITB stretches w/strap Manual Therapy: Cupping Lt lumbar paraspinals --> gliding & static cups Neuromuscular re-ed: Bridges  Core marching Prone press up x10 Quadruped rocking  Cat/cow for spinal articulation     PATIENT EDUCATION:  Education details: HEP review Person educated: Patient Education method: Programmer, multimedia,  Education comprehension: verbalized understanding   HOME EXERCISE PROGRAM: Access Code: [email or web address] URL: [email or web address] Date: 08/10/2023 Prepared by: Lamarr Price  Exercises - Hooklying hip extension + hip adduction + shoulder flexion isometric  - 1 x daily - 7 x weekly - 5 sets - 10 sec hold - Supine Diaphragmatic Breathing  - 1 x daily - 7 x weekly - 3 sets - 10 reps - Quadruped Rocking Slow  - 1 x daily - 7 x weekly - 3 sets - 10 reps - Supine Piriformis Stretch with Leg Straight  - 2 x daily - 7 x weekly - 1 sets - 3 reps - 30 sec  hold - Supine Transversus Abdominis Bracing with Pelvic Floor Contraction  - 2 x daily - 7 x weekly - 1 sets - 10 reps - 10sec  hold - Supine Shoulder Flexion with Dowel  - 2 x daily - 7 x weekly - 1 sets - 5-10 reps - 3 sec  hold - Standing Piriformis Release with Ball at [employer]  - 2 x daily - 7 x weekly - 30-60 sec  hold - Supine Figure 4 Piriformis Stretch  - 1 x daily - 7 x weekly - 3 sets - 10 reps - Supine Hamstring Stretch with Strap  - 1 x daily - 7 x weekly - 3 sets - 10 reps - Supine ITB Stretch with Strap  - 1 x daily - 7 x weekly - 3 sets - 10 reps - Cat Cow  - 1 x daily - 7 x weekly - 3 sets - 10 reps - Supine Bridge  - 1 x daily - 7 x weekly - 3 sets - 10 reps - Supine Lower Trunk Rotation  - 1 x daily - 7 x  weekly - 3 sets - 10 reps  ASSESSMENT:  CLINICAL IMPRESSION: Patient's chief complaint of pain is about the Lt mid/low back. [place] and palpable tenderness present about Lt thoracic paraspinals with partial release from manual therapy. Rocktape applied to paraspinals to determine effects on pain/reducing overactivation of musculature. Patient challenged with posterior pelvic tilts requiring heavy cues for proper activation in both sitting and supine.   EVAL: Patient is a 68 y.o. who was seen today for physical therapy evaluation and treatment for acute on chronic low back pain. Limitations in trunk range of motion were noted, particularly into extension and bilateral side flexion. Hip  range of motion was generally good, but testing strength of the hip musculature did cause low back pain. Weakness was noted, but may be related to pain inhibition. Straight leg raise testing was negative bilaterally. Assessed for movement preferences for lower extremity and upper extremity movements. Patient responded better to resisted hip extension compared to hip flexion, resisted hip internal rotation/adduction compared to external rotation/abduction, and resisted shoulder flexion compared to extension. Provided a home exercises incorporating these movements which patient reported to provide some relief upon standing after performance. Will continue to assess for directional preferences, as well as generally building hip and core strength. Physical therapy is indicated.   GOALS: Goals reviewed with patient? yes  SHORT TERM GOALS: 08/25/2023  Patient will be independent in self management strategies to improve quality of life and functional outcomes. Baseline: New Program Goal status: INITIAL  2.  Patient will report at least 25% improvement in overall symptoms and/or function to demonstrate improved functional mobility Baseline: 0% better Goal status: INITIAL  3.  Patient will improve R and L trunk side  flexion to reach at least the distal 1/3 of the thigh to demonstrate improved spinal mobility. Baseline: mid-thigh R and L Goal status: ongoing   LONG TERM GOALS: 10/06/2023  Patient will report at least 50% improvement in overall symptoms and/or function to demonstrate improved functional mobility Baseline: 0% better Goal status: INITIAL  2.  Patient will walking item on the Patient Specific Functional Scale to by at least 2 points. Baseline: 6 points Goal status: INITIAL  3.  Patient will sitting item on the Patient Specific Functional Scale to by at least 2 points. Baseline: 6 points Goal status: INITIAL  PLAN:  PT FREQUENCY: 2x/week  PT DURATION: 12 weeks  PLANNED INTERVENTIONS: 97110-Therapeutic exercises, 97530- Therapeutic activity, [id number]- Neuromuscular re-education, 97535- Self Care, [id number]- Manual therapy, [phone]- Gait training, [phone]- Orthotic Fit/training, [phone]- Canalith repositioning, [id number]- Aquatic Therapy, [id number]- Electrical stimulation (unattended), [phone]- Ionotophoresis 4mg /ml Dexamethasone , Patient/Family education, Balance training, Stair training, Taping, Dry Needling, Joint mobilization, Joint manipulation, Spinal manipulation, Spinal mobilization, Cryotherapy, and Moist heat   PLAN FOR NEXT SESSION: Hip and core strengthening, continue assessing for movement preferences, continue progressing home program. Manual as needed; taping response. Check leg length.   Camaryn Lumbert, PT, DPT, ATC 08/17/23 8:45 AM

## 2023-08-23 ENCOUNTER — Ambulatory Visit

## 2023-08-23 DIAGNOSIS — R262 Difficulty in walking, not elsewhere classified: Secondary | ICD-10-CM | POA: Diagnosis not present

## 2023-08-23 DIAGNOSIS — M79651 Pain in right thigh: Secondary | ICD-10-CM | POA: Diagnosis not present

## 2023-08-23 DIAGNOSIS — M5459 Other low back pain: Secondary | ICD-10-CM | POA: Diagnosis not present

## 2023-08-23 NOTE — Therapy (Signed)
 OUTPATIENT PHYSICAL THERAPY THORACOLUMBAR TREATMENT   Patient Name: Harold Edwards MRN: [id number] DOB:17-Apr-1955, 68 y.o., male Today's Date: 08/23/2023  END OF SESSION:  PT End of Session - 08/23/23 0847     Visit Number 8    Number of Visits 24    Date for PT Re-Evaluation 10/06/23    Authorization Type [employer] Medicare    Authorization Time Period 16 visits for 07/25/23 - 09/19/23    Authorization - Number of Visits 16    Progress Note Due on Visit 10    PT Start Time 0847    PT Stop Time 0928    PT Time Calculation (min) 41 min    Activity Tolerance Patient tolerated treatment well    Behavior During Therapy [facility] for tasks assessed/performed           Past Medical History:  Diagnosis Date   Back pain, chronic    Cancer (HCC)    Chronic cough 09/19/2017   High cholesterol    Influenza    Pneumonia    Prostate cancer (HCC) 02/21/2014   4+4=8 s/p TRUS biopsy by Dr. Shiela on 01/18/14  Last Assessment & Plan:   Patient be managed by urology.  Patient is status post prostatectomy and radiation therapy.  Denies hematuria.   Sinus tachycardia 09/13/2017   Stress incontinence 04/10/2014   Past Surgical History:  Procedure Laterality Date   ABDOMINAL SURGERY     PROSTATE SURGERY     Prostatectomy robot. 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   Acute metabolic encephalopathy    Aortic atherosclerosis (HCC) 04/28/2020   Senile purpura (HCC) 10/31/2018    Former smoker, stopped smoking in distant past 04/20/2018   Lumbar back pain with radiculopathy affecting right lower extremity 04/14/2018   Chronic cough 09/19/2017   Mixed hyperlipidemia 09/19/2017   Sinus tachycardia 09/13/2017   ED (erectile dysfunction) 04/10/2014   Stress incontinence 04/10/2014   S/P prostatectomy 03/18/2014   History of prostate cancer 02/21/2014    PCP: Vermell Bologna, PA-C REFERRING PROVIDER: Alm Molt, MD REFERRING DIAG: M54.16 (ICD-10-CM) - Radiculopathy, lumbar region  Rationale for Evaluation and Treatment: Rehabilitation  THERAPY DIAG:  Difficulty in walking, not elsewhere classified  Other low back pain  Pain in right thigh  ONSET DATE: Chronic (referral date 07/07/2023)  SUBJECTIVE:  SUBJECTIVE STATEMENT: Better today. Still in my hip, but not bad today. Still in my low back, though.   EVAL: The patient stated he has had low back pain for over 30 years and has been seen at this clinic a number of years ago for low back pain.. Most recently, he underwent a microdiscectomy procedure about 3-4 months ago, which helped with pain and paresthesias he was experiencing at his R anterior thigh. However, he has more recently started to experience L side low back pain as well as pain down the back of his R leg. The patient reported that this pain is worse with prolonged sitting, as well as prolonged weightbearing, like standing or walking. The other day, he was walking around [employer] and he could only make it half way through the store before he was experiencing significant low back and R leg pain. No position completely relieves the pain when resting. Ice and stretching help temporarily but the pain comes back to the same levels. He was prescribed a muscle relaxer, but he does  not feel this helps much.   PERTINENT HISTORY:  Microdiscectomy April 2025   PAIN:  Are you having pain? Yes: NPRS scale: 3-4/10 Pain location: Lt mid back to Rt posterior hip Pain description: tight Aggravating factors: Walking, activity, sitting Relieving factors: Ice, stretching (temporary)  PRECAUTIONS: None  WEIGHT BEARING RESTRICTIONS: No  FALLS:  Has patient fallen in last 6 months? No  PATIENT GOALS: Reduce pain, improve function   OBJECTIVE:  Note: Objective measures were completed at Evaluation unless otherwise noted.  DIAGNOSTIC FINDINGS:  From medical record, 08/23/2022:  CLINICAL DATA:  Low back pain with history of trauma. Right radiculopathy, worsening.   EXAM: MRI LUMBAR SPINE WITHOUT CONTRAST   TECHNIQUE: Multiplanar, multisequence MR imaging of the lumbar spine was performed. No intravenous contrast was administered.   COMPARISON:  08/27/2018   FINDINGS: Segmentation:  Standard.   Alignment:  Physiologic.   Vertebrae:  No fracture, evidence of discitis, or bone lesion.   Conus medullaris and cauda equina: Conus extends to the L1 level. Conus and cauda equina appear normal.   Paraspinal and other soft tissues: Negative for perispinal mass or inflammation.   Disc levels:   T12- L1: Posterior annular fissure.  No impingement   L1-L2: Mild disc narrowing and bulging with no neural impingement   L2-L3: Right paracentral to foraminal extrusion with upward migration. Advanced right L2 impingement. Disc is narrowed and bulging eccentric to the right. Mild facet spurring.   L3-L4: Disc narrowing and bulging with right foraminal annular fissure. Degenerative facet spurring on both sides. No neural compression.   L4-L5: Near complete disc collapse with endplate ridging and disc bulging. Mild to moderate degenerative facet spurring. Mild to moderate bilateral foraminal narrowing   L5-S1:Disc collapse with endplate ridging and small  chronic herniation which is downward pointing and central. Negative facets. Mild bilateral foraminal narrowing, no neural compression.   IMPRESSION: 1. Symptomatic finding is likely at L2-3 where there is a right paracentral to foraminal extrusion with upward migration. Pronounced right L2 impingement. 2. Disc and facet degeneration at L3-4 and below is similar to 2020 with mild to moderate foraminal narrowing bilaterally at L4-5  PATIENT SURVEYS:  Patient-specific activity scoring scheme (Point to one number):  0 represents "unable to perform." 10 represents "able to perform at prior level. Activity Initial  Activity Eval     Walking 6    Sitting 6    Total: 12  Minimum detectable change (90%CI) for average score = 2 points Minimum detectable change (90%CI) for single activity score = 3 points PSFS developed by: Rosalee MYRTIS Marvis KYM Charlet CHRISTELLA., & Binkley, J. (1995). Assessing disability and change on individual patients: a report of a patient specific measure. Physiotherapy [place], 47, [phone]. Reproduced with the permission of the authors  PALPATION: -No notable pain reporduced with palpation of the R or L lumbar paraspinals or R or L posterolateral hip musculature -Some tenderness with PA glides at the lumbar spine, not no major pain.  08/23/23: Rt iliac crest and ASIS elevated in standing   LUMBAR ROM:   AROM eval 08/17/23  Flexion Top of feet, pain across pelvis  Full   Extension 5 degrees, L side low back pain 75% limited, Lt low/mid back pain   Right lateral flexion Reaches mid-thigh, low back pain 50% limited, Lt low/mid back pain  Left lateral flexion Reaches mid-thigh, low back pain 50% limited, mild Lt low/mid back pain  Right rotation    Left rotation     (Blank rows = not tested)  LOWER EXTREMITY ROM:     Passive  Right eval Left eval  Hip flexion WNL WNL  Hip extension WNL WNL  Hip abduction WNL WNL  Hip adduction    Hip internal rotation at  90 deg flexion WNL WNL  Hip external rotation at 90 deg flexion 45 25  Knee flexion WNL WNL  Knee extension WNL WNL  Ankle dorsiflexion    Ankle plantarflexion    Ankle inversion    Ankle eversion     (Blank rows = not tested)  LOWER EXTREMITY MMT:    MMT Right eval Left eval  Trunk rotation in hooklying, testing through lower extremities 3+** 4*  Trunk rotation in hooklying, testing through upper extremities 3+** 4*  Hip flexion 4* 4*  Hip extension 4* 4*  Hip abduction 4* 4*  Hip adduction 4** 4**  Hip internal rotation 5* 5*  Hip external rotation 4* 4*  Knee flexion 5* 5*  Knee extension 5* 5**  Ankle dorsiflexion    Ankle plantarflexion    Ankle inversion    Ankle eversion     (Blank rows = not tested)  *= low back pain **= significant low back pain  LUMBAR SPECIAL TESTS:  Straight leg raise test: Negative bilaterally  08/23/23: (+) Long sit  [facility] Adult PT Treatment:                                                DATE: 08/23/23 Therapeutic Exercise: Quadruped rocking x 10  Supine piriformis stretch x 30 sec each  Figure 4 stretch x 30 sec each  HS stretch with strap x 30 sec each  IT band with strap x 30 each  LTR x 1 minute Manual Therapy: Muscle energy technique to improve pelvic symmetry Neuromuscular re-ed: Supine TA activation x 5; 5 sec hold  Bridges x 5  Seated posterior pelvic tilts x 10  Supine posterior pelvic tilt multiple reps  Sidelying hip abduction 2 x 10   [facility] Adult PT Treatment:  DATE: 08/17/23 Therapeutic Exercise: LTR with figure 4 x 1 minute each  Manual Therapy: SMT bilateral thoracolumbar paraspinals Rocktape to bilateral thoracolumbar paraspinals 50% tension I strip  Neuromuscular re-ed: Supine posterior pelvic tilt multiple reps, heavy cues Seated posterior pelvic tilt 2 x 10, heavy cues Sidelying hip abduction 2 x 10    [facility] Adult PT Treatment:                                                 DATE: 08/15/2023 Therapeutic Exercise: Supine figure 4 stretch  Figure 4 LTR (small ROM) to Lt side  Seated thoracic extension stretch + yoga mat roll 10x10 Self-massage with 4 ball along Lt thoracolumbar paraspinals  Prone prop on elbows 2 x1 min SKTC Manual Therapy: IASTM (Lt) thoracolumbar paraspinals Static cups (Lt) lumbar + child's pose rocking Supine lumbar manual distraction x 1 min Neuromuscular re-ed: Standing lateral pelvic glide toward wall  Standing posterior pelvic tilt against wall x 5 Core marching  Unilateral 90/90 heel taps    [facility] Adult PT Treatment:                                                DATE: 08/10/2023 Therapeutic Exercise: Lateral pelvic glide towards L Supine figure 4 stretch (Lt) Prone prop on elbows stretch x 1 min LTR x 1 min HS & ITB stretches w/strap Manual Therapy: Cupping Lt lumbar paraspinals --> gliding & static cups Neuromuscular re-ed: Bridges  Core marching Prone press up x10 Quadruped rocking  Cat/cow for spinal articulation     PATIENT EDUCATION:  Education details: HEP review, update Person educated: Patient Education method: Programmer, multimedia, demo, cues, handout Education comprehension: verbalized understanding, returned demo, cues   HOME EXERCISE PROGRAM: Access Code: [email or web address] URL: [email or web address] Date: 08/23/2023 Prepared by: Lucie Meeter  Exercises - Standing Piriformis Release with Ball at Wall  - 2 x daily - 7 x weekly - 30-60 sec  hold - Quadruped Rocking Slow  - 1 x daily - 7 x weekly - 2 sets - 10 reps - Supine Piriformis Stretch with Leg Straight  - 2 x daily - 7 x weekly - 1 sets - 3 reps - 30 sec  hold - Supine Figure 4 Piriformis Stretch  - 1 x daily - 7 x weekly - 3 sets - 30 sec  hold - Supine Hamstring Stretch with Strap  - 1 x daily - 7 x weekly - 3 sets - 30 sec  hold - Supine ITB Stretch with Strap  - 1 x daily - 7 x weekly - 3 sets - 30 sec  hold - Supine Lower Trunk  Rotation  - 1 x daily - 7 x weekly - 2 sets - 10 reps - Supine Bridge  - 1 x daily - 7 x weekly - 2 sets - 10 reps - Supine Transversus Abdominis Bracing with Pelvic Floor Contraction  - 2 x daily - 7 x weekly - 1 sets - 10 reps - 10sec  hold - Sidelying Hip Abduction  - 1 x daily - 7 x weekly - 2 sets - 10 reps  ASSESSMENT:  CLINICAL IMPRESSION: Patient noted to have assymetrical pelvic alignment upon arrival. Pelvic alignment easily corrects with muscle  energy technique.  Focused on reviewing and updating HEP with patient requiring cues for setup of majority of stretching activity and educated on sets/reps/hold times. Good carryover of seated posterior pelvic tilts, but remains challenged with proper activation with supine posterior pelvic tilts.   EVAL: Patient is a 68 y.o. who was seen today for physical therapy evaluation and treatment for acute on chronic low back pain. Limitations in trunk range of motion were noted, particularly into extension and bilateral side flexion. Hip range of motion was generally good, but testing strength of the hip musculature did cause low back pain. Weakness was noted, but may be related to pain inhibition. Straight leg raise testing was negative bilaterally. Assessed for movement preferences for lower extremity and upper extremity movements. Patient responded better to resisted hip extension compared to hip flexion, resisted hip internal rotation/adduction compared to external rotation/abduction, and resisted shoulder flexion compared to extension. Provided a home exercises incorporating these movements which patient reported to provide some relief upon standing after performance. Will continue to assess for directional preferences, as well as generally building hip and core strength. Physical therapy is indicated.   GOALS: Goals reviewed with patient? yes  SHORT TERM GOALS: 08/25/2023  Patient will be independent in self management strategies to improve quality  of life and functional outcomes. Baseline: New Program Goal status: ongoing  2.  Patient will report at least 25% improvement in overall symptoms and/or function to demonstrate improved functional mobility Baseline: 0% better 08/23/23: 60% improvement  Goal status: MET  3.  Patient will improve R and L trunk side flexion to reach at least the distal 1/3 of the thigh to demonstrate improved spinal mobility. Baseline: mid-thigh R and L Goal status: ongoing   LONG TERM GOALS: 10/06/2023  Patient will report at least 50% improvement in overall symptoms and/or function to demonstrate improved functional mobility Baseline: 0% better Goal status: INITIAL  2.  Patient will walking item on the Patient Specific Functional Scale to by at least 2 points. Baseline: 6 points Goal status: INITIAL  3.  Patient will sitting item on the Patient Specific Functional Scale to by at least 2 points. Baseline: 6 points Goal status: INITIAL  PLAN:  PT FREQUENCY: 2x/week  PT DURATION: 12 weeks  PLANNED INTERVENTIONS: 97110-Therapeutic exercises, 97530- Therapeutic activity, [id number]- Neuromuscular re-education, 97535- Self Care, [id number]- Manual therapy, [phone]- Gait training, [phone]- Orthotic Fit/training, [phone]- Canalith repositioning, [id number]- Aquatic Therapy, [id number]- Electrical stimulation (unattended), [phone]- Ionotophoresis 4mg /ml Dexamethasone , Patient/Family education, Balance training, Stair training, Taping, Dry Needling, Joint mobilization, Joint manipulation, Spinal manipulation, Spinal mobilization, Cryotherapy, and Moist heat   PLAN FOR NEXT SESSION: Hip and core strengthening, lumbar mobility.   Emon Lance, PT, DPT, ATC 08/23/23 9:28 AM

## 2023-08-25 ENCOUNTER — Ambulatory Visit

## 2023-08-25 DIAGNOSIS — M5459 Other low back pain: Secondary | ICD-10-CM | POA: Diagnosis not present

## 2023-08-25 DIAGNOSIS — M79651 Pain in right thigh: Secondary | ICD-10-CM | POA: Diagnosis not present

## 2023-08-25 DIAGNOSIS — R262 Difficulty in walking, not elsewhere classified: Secondary | ICD-10-CM | POA: Diagnosis not present

## 2023-08-25 NOTE — Therapy (Signed)
 OUTPATIENT PHYSICAL THERAPY THORACOLUMBAR TREATMENT   Patient Name: Harold Edwards MRN: [id number] DOB:[birth year], 68 y.o., male Today's Date: 08/25/2023  END OF SESSION:  PT End of Session - 08/25/23 1314     Visit Number 9    Number of Visits 24    Date for PT Re-Evaluation 10/06/23    Authorization Type [employer]    Authorization Time Period 16 visits for 07/25/23 - 09/19/23    Authorization - Visit Number 8    Authorization - Number of Visits 16    Progress Note Due on Visit 10    PT Start Time 1320    PT Stop Time 1359    PT Time Calculation (min) 39 min    Activity Tolerance Patient limited by pain    Behavior During Therapy [facility] for tasks assessed/performed           Past Medical History:  Diagnosis Date   Back pain, chronic    Cancer (HCC)    Chronic cough 09/19/2017   High cholesterol    Influenza    Pneumonia    Prostate cancer (HCC) 02/21/2014   4+4=8 s/p TRUS biopsy by Dr. Shiela on 01/18/14  Last Assessment & Plan:   Patient be managed by urology.  Patient is status post prostatectomy and radiation therapy.  Denies hematuria.   Sinus tachycardia 09/13/2017   Stress incontinence 04/10/2014   Past Surgical History:  Procedure Laterality Date   ABDOMINAL SURGERY     PROSTATE SURGERY     Prostatectomy robot. 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   Acute metabolic encephalopathy    Aortic atherosclerosis (HCC) 04/28/2020   Senile  purpura (HCC) 10/31/2018   Former smoker, stopped smoking in distant past 04/20/2018   Lumbar back pain with radiculopathy affecting right lower extremity 04/14/2018   Chronic cough 09/19/2017   Mixed hyperlipidemia 09/19/2017   Sinus tachycardia 09/13/2017   ED (erectile dysfunction) 04/10/2014   Stress incontinence 04/10/2014   S/P prostatectomy 03/18/2014   History of prostate cancer 02/21/2014    PCP: Vermell Bologna, PA-C REFERRING PROVIDER: Alm Molt, MD REFERRING DIAG: M54.16 (ICD-10-CM) - Radiculopathy, lumbar region  Rationale for Evaluation and Treatment: Rehabilitation  THERAPY DIAG:  Difficulty in walking, not elsewhere classified  Other low back pain  Pain in right thigh  ONSET DATE: Chronic (referral date 07/07/2023)  SUBJECTIVE:  SUBJECTIVE STATEMENT: Patient did a lot of yard work yesterday and now his Lt low back and Rt hip are hurting.   EVAL: The patient stated he has had low back pain for over 30 years and has been seen at this clinic a number of years ago for low back pain.. Most recently, he underwent a microdiscectomy procedure about 3-4 months ago, which helped with pain and paresthesias he was experiencing at his R anterior thigh. However, he has more recently started to experience L side low back pain as well as pain down the back of his R leg. The patient reported that this pain is worse with prolonged sitting, as well as prolonged weightbearing, like standing or walking. The other day, he was walking around [employer] and he could only make it half way through the store before he was experiencing significant low back and R leg pain. No position completely relieves the pain when resting. Ice and stretching help temporarily but the pain comes back to the same levels. He was  prescribed a muscle relaxer, but he does not feel this helps much.   PERTINENT HISTORY:  Microdiscectomy April 2025   PAIN:  Are you having pain? Yes: NPRS scale: 20/10 Pain location: Lt mid back to Rt posterior hip Pain description: tight Aggravating factors: Walking, activity, sitting Relieving factors: Ice, stretching (temporary)  PRECAUTIONS: None  WEIGHT BEARING RESTRICTIONS: No  FALLS:  Has patient fallen in last 6 months? No  PATIENT GOALS: Reduce pain, improve function   OBJECTIVE:  Note: Objective measures were completed at Evaluation unless otherwise noted.  DIAGNOSTIC FINDINGS:  From medical record, 08/23/2022:  CLINICAL DATA:  Low back pain with history of trauma. Right radiculopathy, worsening.   EXAM: MRI LUMBAR SPINE WITHOUT CONTRAST   TECHNIQUE: Multiplanar, multisequence MR imaging of the lumbar spine was performed. No intravenous contrast was administered.   COMPARISON:  08/27/2018   FINDINGS: Segmentation:  Standard.   Alignment:  Physiologic.   Vertebrae:  No fracture, evidence of discitis, or bone lesion.   Conus medullaris and cauda equina: Conus extends to the L1 level. Conus and cauda equina appear normal.   Paraspinal and other soft tissues: Negative for perispinal mass or inflammation.   Disc levels:   T12- L1: Posterior annular fissure.  No impingement   L1-L2: Mild disc narrowing and bulging with no neural impingement   L2-L3: Right paracentral to foraminal extrusion with upward migration. Advanced right L2 impingement. Disc is narrowed and bulging eccentric to the right. Mild facet spurring.   L3-L4: Disc narrowing and bulging with right foraminal annular fissure. Degenerative facet spurring on both sides. No neural compression.   L4-L5: Near complete disc collapse with endplate ridging and disc bulging. Mild to moderate degenerative facet spurring. Mild to moderate bilateral foraminal narrowing   L5-S1:Disc  collapse with endplate ridging and small chronic herniation which is downward pointing and central. Negative facets. Mild bilateral foraminal narrowing, no neural compression.   IMPRESSION: 1. Symptomatic finding is likely at L2-3 where there is a right paracentral to foraminal extrusion with upward migration. Pronounced right L2 impingement. 2. Disc and facet degeneration at L3-4 and below is similar to 2020 with mild to moderate foraminal narrowing bilaterally at L4-5  PATIENT SURVEYS:  Patient-specific activity scoring scheme (Point to one number):  0 represents "unable to perform." 10 represents "able to perform at prior level. Activity Initial  Activity Eval     Walking 6    Sitting 6    Total: 12  Minimum detectable change (90%CI) for average score = 2 points Minimum detectable change (90%CI) for single activity score = 3 points PSFS developed by: Rosalee MYRTIS Marvis KYM Charlet CHRISTELLA., & Binkley, J. (1995). Assessing disability and change on individual patients: a report of a patient specific measure. Physiotherapy [place], 47, [phone]. Reproduced with the permission of the authors  PALPATION: -No notable pain reporduced with palpation of the R or L lumbar paraspinals or R or L posterolateral hip musculature -Some tenderness with PA glides at the lumbar spine, not no major pain.  08/23/23: Rt iliac crest and ASIS elevated in standing   LUMBAR ROM:   AROM eval 08/17/23  Flexion Top of feet, pain across pelvis  Full   Extension 5 degrees, L side low back pain 75% limited, Lt low/mid back pain   Right lateral flexion Reaches mid-thigh, low back pain 50% limited, Lt low/mid back pain  Left lateral flexion Reaches mid-thigh, low back pain 50% limited, mild Lt low/mid back pain  Right rotation    Left rotation     (Blank rows = not tested)  LOWER EXTREMITY ROM:     Passive  Right eval Left eval  Hip flexion WNL WNL  Hip extension WNL WNL  Hip abduction WNL WNL  Hip  adduction    Hip internal rotation at 90 deg flexion WNL WNL  Hip external rotation at 90 deg flexion 45 25  Knee flexion WNL WNL  Knee extension WNL WNL  Ankle dorsiflexion    Ankle plantarflexion    Ankle inversion    Ankle eversion     (Blank rows = not tested)  LOWER EXTREMITY MMT:    MMT Right eval Left eval  Trunk rotation in hooklying, testing through lower extremities 3+** 4*  Trunk rotation in hooklying, testing through upper extremities 3+** 4*  Hip flexion 4* 4*  Hip extension 4* 4*  Hip abduction 4* 4*  Hip adduction 4** 4**  Hip internal rotation 5* 5*  Hip external rotation 4* 4*  Knee flexion 5* 5*  Knee extension 5* 5**  Ankle dorsiflexion    Ankle plantarflexion    Ankle inversion    Ankle eversion     (Blank rows = not tested)  *= low back pain **= significant low back pain  LUMBAR SPECIAL TESTS:  Straight leg raise test: Negative bilaterally  08/23/23: (+) Long sit  [facility] Adult PT Treatment:                                                DATE: 08/25/23 Therapeutic Exercise: Prone pressup x 5 Prone on elbows x 3 minutes  Standing lumbar extension x 5  Figure 4 stretch 2 x 30 sec  HEP review, recommended to spend time in prone on elbows for pain relief  Manual Therapy: STM bilateral thoracolumbar paraspinals and QL  Self Care: Ice, topical analgesics for pain control Modifying activity as to not over do it (energy conservation as it relates to his back)    [facility] Adult PT Treatment:                                                DATE: 08/23/23 Therapeutic Exercise: Quadruped rocking x 10  Supine piriformis stretch x 30 sec each  Figure 4 stretch x 30 sec each  HS stretch with strap x 30 sec each  IT band with strap x 30 each  LTR x 1 minute Manual Therapy: Muscle energy technique to improve pelvic symmetry Neuromuscular re-ed: Supine TA activation x 5; 5 sec hold  Bridges x 5  Seated posterior pelvic tilts x 10  Supine posterior pelvic  tilt multiple reps  Sidelying hip abduction 2 x 10   [facility] Adult PT Treatment:                                                DATE: 08/17/23 Therapeutic Exercise: LTR with figure 4 x 1 minute each  Manual Therapy: SMT bilateral thoracolumbar paraspinals Rocktape to bilateral thoracolumbar paraspinals 50% tension I strip  Neuromuscular re-ed: Supine posterior pelvic tilt multiple reps, heavy cues Seated posterior pelvic tilt 2 x 10, heavy cues Sidelying hip abduction 2 x 10    [facility] Adult PT Treatment:                                                DATE: 08/15/2023 Therapeutic Exercise: Supine figure 4 stretch  Figure 4 LTR (small ROM) to Lt side  Seated thoracic extension stretch + yoga mat roll 10x10 Self-massage with 4 ball along Lt thoracolumbar paraspinals  Prone prop on elbows 2 x1 min [facility] Manual Therapy: IASTM (Lt) thoracolumbar paraspinals Static cups (Lt) lumbar + child's pose rocking Supine lumbar manual distraction x 1 min Neuromuscular re-ed: Standing lateral pelvic glide toward wall  Standing posterior pelvic tilt against wall x 5 Core marching  Unilateral 90/90 heel taps       PATIENT EDUCATION:  Education details: HEP review Person educated: Patient Education method: Explanation Education comprehension: verbalized understanding  HOME EXERCISE PROGRAM: Access Code: [email or web address] URL: [email or web address] Date: 08/23/2023 Prepared by: Lucie Meeter  Exercises - Standing Piriformis Release with Ball at Wall  - 2 x daily - 7 x weekly - 30-60 sec  hold - Quadruped Rocking Slow  - 1 x daily - 7 x weekly - 2 sets - 10 reps - Supine Piriformis Stretch with Leg Straight  - 2 x daily - 7 x weekly - 1 sets - 3 reps - 30 sec  hold - Supine Figure 4 Piriformis Stretch  - 1 x daily - 7 x weekly - 3 sets - 30 sec  hold - Supine Hamstring Stretch with Strap  - 1 x daily - 7 x weekly - 3 sets - 30 sec  hold - Supine ITB Stretch with Strap  - 1 x daily - 7 x  weekly - 3 sets - 30 sec  hold - Supine Lower Trunk Rotation  - 1 x daily - 7 x weekly - 2 sets - 10 reps - Supine Bridge  - 1 x daily - 7 x weekly - 2 sets - 10 reps - Supine Transversus Abdominis Bracing with Pelvic Floor Contraction  - 2 x daily - 7 x weekly - 1 sets - 10 reps - 10sec  hold - Sidelying Hip Abduction  - 1 x daily - 7 x weekly - 2 sets - 10 reps  ASSESSMENT:  CLINICAL IMPRESSION:  Patient arrives with an increase in back pain attributed to yard work yesterday. Mild improvement in pain noted following manual therapy to the low/mid back. With prone pressup pain centralizes from the Rt hip to the left low back, but patient reports an increase in pain in the low back and only tolerates minimal reps. With prone on elbows pain is significantly reduced rated as 1-2 along Lt mid/low back, but quickly increases when he returns to standing. Patient declined ice at end of session and has plans to ice at home.   EVAL: Patient is a 68 y.o. who was seen today for physical therapy evaluation and treatment for acute on chronic low back pain. Limitations in trunk range of motion were noted, particularly into extension and bilateral side flexion. Hip range of motion was generally good, but testing strength of the hip musculature did cause low back pain. Weakness was noted, but may be related to pain inhibition. Straight leg raise testing was negative bilaterally. Assessed for movement preferences for lower extremity and upper extremity movements. Patient responded better to resisted hip extension compared to hip flexion, resisted hip internal rotation/adduction compared to external rotation/abduction, and resisted shoulder flexion compared to extension. Provided a home exercises incorporating these movements which patient reported to provide some relief upon standing after performance. Will continue to assess for directional preferences, as well as generally building hip and core strength. Physical therapy  is indicated.   GOALS: Goals reviewed with patient? yes  SHORT TERM GOALS: 08/25/2023  Patient will be independent in self management strategies to improve quality of life and functional outcomes. Baseline: New Program Goal status: ongoing  2.  Patient will report at least 25% improvement in overall symptoms and/or function to demonstrate improved functional mobility Baseline: 0% better 08/23/23: 60% improvement  Goal status: MET  3.  Patient will improve R and L trunk side flexion to reach at least the distal 1/3 of the thigh to demonstrate improved spinal mobility. Baseline: mid-thigh R and L Goal status: ongoing   LONG TERM GOALS: 10/06/2023  Patient will report at least 50% improvement in overall symptoms and/or function to demonstrate improved functional mobility Baseline: 0% better Goal status: INITIAL  2.  Patient will walking item on the Patient Specific Functional Scale to by at least 2 points. Baseline: 6 points Goal status: INITIAL  3.  Patient will sitting item on the Patient Specific Functional Scale to by at least 2 points. Baseline: 6 points Goal status: INITIAL  PLAN:  PT FREQUENCY: 2x/week  PT DURATION: 12 weeks  PLANNED INTERVENTIONS: [id number]-Therapeutic exercises, 97530- Therapeutic activity, [id number]- Neuromuscular re-education, 97535- Self Care, [id number]- Manual therapy, [phone]- Gait training, [phone]- Orthotic Fit/training, [phone]- Canalith repositioning, [id number]- Aquatic Therapy, [id number]- Electrical stimulation (unattended), [phone]- Ionotophoresis 4mg /ml Dexamethasone , Patient/Family education, Balance training, Stair training, Taping, Dry Needling, Joint mobilization, Joint manipulation, Spinal manipulation, Spinal mobilization, Cryotherapy, and Moist heat   PLAN FOR NEXT SESSION: Hip and core strengthening, lumbar mobility. PROGRESS NOTE  Londin Antone, PT, DPT, ATC 08/25/23 2:02 PM

## 2023-08-30 ENCOUNTER — Ambulatory Visit

## 2023-08-30 DIAGNOSIS — M79651 Pain in right thigh: Secondary | ICD-10-CM

## 2023-08-30 DIAGNOSIS — M5459 Other low back pain: Secondary | ICD-10-CM | POA: Diagnosis not present

## 2023-08-30 DIAGNOSIS — R262 Difficulty in walking, not elsewhere classified: Secondary | ICD-10-CM | POA: Diagnosis not present

## 2023-08-30 NOTE — Therapy (Addendum)
 OUTPATIENT PHYSICAL THERAPY THORACOLUMBAR PROGRESS NOTE Progress Note Reporting Period 07/14/2023 to 08/30/2023  See note below for Objective Data and Assessment of Progress/Goals.  PHYSICAL THERAPY DISCHARGE SUMMARY  Visits from Start of Care: 10  Current functional level related to goals / functional outcomes: See goals below   Remaining deficits: Status unknown   Education / Equipment: N/A   Patient agrees to discharge. Patient goals were partially met. Patient is being discharged due to not returning since the last visit.      Patient Name: Harold Edwards MRN: [id number] DOB:08-21-1955, 68 y.o., male Today's Date: 08/30/2023  END OF SESSION:  PT End of Session - 08/30/23 1017     Visit Number 10    Number of Visits 24    Date for PT Re-Evaluation 10/06/23    Authorization Type [employer] Medicare    Authorization Time Period 16 visits for 07/25/23 - 09/19/23    Authorization - Visit Number 10    Authorization - Number of Visits 16    Progress Note Due on Visit 10    PT Start Time 1018    PT Stop Time 1103    PT Time Calculation (min) 45 min    Activity Tolerance Patient tolerated treatment well    Behavior During Therapy [facility] for tasks assessed/performed         Past Medical History:  Diagnosis Date   Back pain, chronic    Cancer (HCC)    Chronic cough 09/19/2017   High cholesterol    Influenza    Pneumonia    Prostate cancer (HCC) 02/21/2014   4+4=8 s/p TRUS biopsy by Dr. Shiela on 01/18/14  Last Assessment & Plan:   Patient be managed by urology.  Patient is status post prostatectomy and radiation therapy.  Denies hematuria.   Sinus tachycardia 09/13/2017   Stress incontinence 04/10/2014   Past Surgical History:  Procedure Laterality Date   ABDOMINAL SURGERY     PROSTATE SURGERY     Prostatectomy robot. 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/2020   Acute metabolic encephalopathy    Aortic atherosclerosis (HCC) 04/28/2020   Senile purpura (HCC) 10/31/2018   Former smoker, stopped smoking in distant past 04/20/2018   Lumbar back pain with radiculopathy affecting right lower extremity 04/14/2018   Chronic cough 09/19/2017   Mixed hyperlipidemia 09/19/2017   Sinus tachycardia 09/13/2017   ED (erectile dysfunction) 04/10/2014   Stress incontinence 04/10/2014   S/P prostatectomy 03/18/2014   History of prostate cancer 02/21/2014    PCP: Vermell Bologna, PA-C REFERRING PROVIDER: Alm Molt, MD REFERRING DIAG: M54.16 (ICD-10-CM) - Radiculopathy, lumbar region  Rationale for Evaluation and Treatment: Rehabilitation  THERAPY DIAG:  Difficulty in walking, not elsewhere classified  Other low back pain  Pain in right thigh  ONSET DATE: Chronic (referral date 07/07/2023)  SUBJECTIVE:  SUBJECTIVE STATEMENT: Patient reports he was walking around [employer] yesterday and ended up having back pain that went down Rt lateral thigh; states today he has discomfort along bilateral low back that radiates down Rt thigh. Patient states the exercises are helping but he continues to get pain with prolonged walking; states he rode his motorcycle for 100 miles and his back hurt really bad afterwards.   EVAL: The patient stated he has had low back pain for over 30  years and has been seen at this clinic a number of years ago for low back pain.. Most recently, he underwent a microdiscectomy procedure about 3-4 months ago, which helped with pain and paresthesias he was experiencing at his R anterior thigh. However, he has more recently started to experience L side low back pain as well as pain down the back of his R leg. The patient reported that this pain is worse with prolonged sitting, as well as prolonged weightbearing, like standing or walking. The other day, he was walking around [employer] and he could only make it half way through the store before he was experiencing significant low back and R leg pain. No position completely relieves the pain when resting. Ice and stretching help temporarily but the pain comes back to the same levels. He was prescribed a muscle relaxer, but he does not feel this helps much.   PERTINENT HISTORY:  Microdiscectomy April 2025   PAIN:  Are you having pain? Yes: NPRS scale: 20/10 Pain location: Lt mid back to Rt posterior hip Pain description: tight Aggravating factors: Walking, activity, sitting Relieving factors: Ice, stretching (temporary)  PRECAUTIONS: None  WEIGHT BEARING RESTRICTIONS: No  FALLS:  Has patient fallen in last 6 months? No  PATIENT GOALS: Reduce pain, improve function   OBJECTIVE:  Note: Objective measures were completed at Evaluation unless otherwise noted.  DIAGNOSTIC FINDINGS:  From medical record, 08/23/2022:  CLINICAL DATA:  Low back pain with history of trauma. Right radiculopathy, worsening.   EXAM: MRI LUMBAR SPINE WITHOUT CONTRAST   TECHNIQUE: Multiplanar, multisequence MR imaging of the lumbar spine was performed. No intravenous contrast was administered.   COMPARISON:  08/27/2018   FINDINGS: Segmentation:  Standard.   Alignment:  Physiologic.   Vertebrae:  No fracture, evidence of discitis, or bone lesion.   Conus medullaris and cauda equina: Conus extends to the L1  level. Conus and cauda equina appear normal.   Paraspinal and other soft tissues: Negative for perispinal mass or inflammation.   Disc levels:   T12- L1: Posterior annular fissure.  No impingement   L1-L2: Mild disc narrowing and bulging with no neural impingement   L2-L3: Right paracentral to foraminal extrusion with upward migration. Advanced right L2 impingement. Disc is narrowed and bulging eccentric to the right. Mild facet spurring.   L3-L4: Disc narrowing and bulging with right foraminal annular fissure. Degenerative facet spurring on both sides. No neural compression.   L4-L5: Near complete disc collapse with endplate ridging and disc bulging. Mild to moderate degenerative facet spurring. Mild to moderate bilateral foraminal narrowing   L5-S1: Disc collapse with endplate ridging and small chronic herniation which is downward pointing and central. Negative facets. Mild bilateral foraminal narrowing, no neural compression.   IMPRESSION: 1. Symptomatic finding is likely at L2-3 where there is a right paracentral to foraminal extrusion with upward migration. Pronounced right L2 impingement. 2. Disc and facet degeneration at L3-4 and below is similar to 2020 with mild to moderate foraminal narrowing bilaterally at L4-5  PATIENT SURVEYS:  Patient-specific activity scoring scheme (Point to one number):  0 represents "unable to perform." 10 represents "able to perform at prior level. Activity Initial  Activity Eval   08/30/23  Walking 6  6  Sitting 6 5   Total: 12 11   Minimum detectable change (90%CI) for average score = 2 points Minimum detectable change (90%CI) for single activity score = 3 points PSFS developed by: Rosalee MYRTIS Marvis KYM Charlet CHRISTELLA., & Binkley, J. (1995). Assessing disability and change on individual patients: a report of a patient specific measure. [employer] [place], 47, [phone]. Reproduced with the permission of the  authors  PALPATION: -No notable pain reporduced with palpation of the R or L lumbar paraspinals or R or L posterolateral hip musculature -Some tenderness with PA glides at the lumbar spine, not no major pain.  08/23/23: Rt iliac crest and ASIS elevated in standing   LUMBAR ROM:   AROM eval 08/17/23 08/30/23  Flexion Top of feet, pain across pelvis  Full  Full  Extension 5 degrees, L side low back pain 75% limited, Lt low/mid back pain  75%, bilateral mild pain  Right lateral flexion Reaches mid-thigh, low back pain 50% limited, Lt low/mid back pain 50% mild stiffness on Lt  Left lateral flexion Reaches mid-thigh, low back pain 50% limited, mild Lt low/mid back pain 50% mild stiffness on Rt  Right rotation     Left rotation      (Blank rows = not tested)  LOWER EXTREMITY ROM:     Passive  Right eval Left eval   Hip flexion WNL WNL   Hip extension WNL WNL   Hip abduction WNL WNL   Hip adduction     Hip internal rotation at 90 deg flexion WNL WNL   Hip external rotation at 90 deg flexion 45 25   Knee flexion WNL WNL   Knee extension WNL WNL   Ankle dorsiflexion     Ankle plantarflexion     Ankle inversion     Ankle eversion      (Blank rows = not tested)  LOWER EXTREMITY MMT:    MMT Right eval Left eval Right  08/30/23 Left 08/30/23  Trunk rotation in hooklying, testing through lower extremities 3+** 4*    Trunk rotation in hooklying, testing through upper extremities 3+** 4*    Hip flexion 4* 4* 5 seated 5 seated   Hip extension 4* 4* 5 in prone 4+ in prone  Hip abduction 4* 4* 5 in S/L  5 in S/L  Hip adduction 4** 4** 3+ in S/L  4 in S/L  Hip internal rotation 5* 5*    Hip external rotation 4* 4*    Knee flexion 5* 5*    Knee extension 5* 5**    Ankle dorsiflexion      Ankle plantarflexion      Ankle inversion      Ankle eversion       (Blank rows = not tested)  *= low back pain **= significant low back pain  LUMBAR SPECIAL TESTS:  Straight leg raise test:  Negative bilaterally  08/23/23: (+) Long sit   [facility] Adult PT Treatment:                                                DATE: 08/30/2023 Therapeutic Exercise: Updating LTG (see  below) Prone pressup x 10 Prone on elbows x 1 min, x 2 min Figure 4 stretch with Lt leg propped on orange PB 2x30 Hip MMT Self-massage with orange spiky ball proximal --> distal HS Neuromuscular re-ed: S/L hip adduction leg raise --> unable to lift with low back pain Seated hip add with green bolster squeeze Rt LAQ + green bolster squeeze --> discontinued d/t lateral hip pain   [facility] Adult PT Treatment:                                                DATE: 08/25/23 Therapeutic Exercise: Prone pressup x 5 Prone on elbows x 3 minutes  Standing lumbar extension x 5  Figure 4 stretch 2 x 30 sec  HEP review, recommended to spend time in prone on elbows for pain relief  Manual Therapy: STM bilateral thoracolumbar paraspinals and QL  Self Care: Ice, topical analgesics for pain control Modifying activity as to not over do it (energy conservation as it relates to his back)    [facility] Adult PT Treatment:                                                DATE: 08/23/23 Therapeutic Exercise: Quadruped rocking x 10  Supine piriformis stretch x 30 sec each  Figure 4 stretch x 30 sec each  HS stretch with strap x 30 sec each  IT band with strap x 30 each  LTR x 1 minute Manual Therapy: Muscle energy technique to improve pelvic symmetry Neuromuscular re-ed: Supine TA activation x 5; 5 sec hold  Bridges x 5  Seated posterior pelvic tilts x 10  Supine posterior pelvic tilt multiple reps  Sidelying hip abduction 2 x 10    PATIENT EDUCATION:  Education details: HEP review Person educated: Patient Education method: Explanation Education comprehension: verbalized understanding  HOME EXERCISE PROGRAM: Access Code: [email or web address] URL: [email or web address] Date: 08/23/2023 Prepared by: Lucie Meeter  Exercises - Standing Piriformis Release with Ball at Wall  - 2 x daily - 7 x weekly - 30-60 sec  hold - Quadruped Rocking Slow  - 1 x daily - 7 x weekly - 2 sets - 10 reps - Supine Piriformis Stretch with Leg Straight  - 2 x daily - 7 x weekly - 1 sets - 3 reps - 30 sec  hold - Supine Figure 4 Piriformis Stretch  - 1 x daily - 7 x weekly - 3 sets - 30 sec  hold - Supine Hamstring Stretch with Strap  - 1 x daily - 7 x weekly - 3 sets - 30 sec  hold - Supine ITB Stretch with Strap  - 1 x daily - 7 x weekly - 3 sets - 30 sec  hold - Supine Lower Trunk Rotation  - 1 x daily - 7 x weekly - 2 sets - 10 reps - Supine Bridge  - 1 x daily - 7 x weekly - 2 sets - 10 reps - Supine Transversus Abdominis Bracing with Pelvic Floor Contraction  - 2 x daily - 7 x weekly - 1 sets - 10 reps - 10sec  hold - Sidelying Hip Abduction  - 1 x daily - 7 x weekly - 2 sets -  10 reps  ASSESSMENT:  CLINICAL IMPRESSION: LE strength has improved since evaluation with significant decrease in pain with movement in all directions, with exception of Rt LE adduction. Patient unable to lift leg and had increased low back pain with attempt. Trialed seated hip adduction isometric exercise but discontinued due to exacerbation of Rt lateral hip pain. No change in radiating pain pattern across bilateral low back and down Rt thigh. Patient plans to follow up with PCP on other options for plan of care to address constant sciatic pain.    EVAL: Patient is a 68 y.o. who was seen today for physical therapy evaluation and treatment for acute on chronic low back pain. Limitations in trunk range of motion were noted, particularly into extension and bilateral side flexion. Hip range of motion was generally good, but testing strength of the hip musculature did cause low back pain. Weakness was noted, but may be related to pain inhibition. Straight leg raise testing was negative bilaterally. Assessed for movement preferences for lower  extremity and upper extremity movements. Patient responded better to resisted hip extension compared to hip flexion, resisted hip internal rotation/adduction compared to external rotation/abduction, and resisted shoulder flexion compared to extension. Provided a home exercises incorporating these movements which patient reported to provide some relief upon standing after performance. Will continue to assess for directional preferences, as well as generally building hip and core strength. Physical therapy is indicated.   GOALS: Goals reviewed with patient? yes  SHORT TERM GOALS: 08/25/2023  Patient will be independent in self management strategies to improve quality of life and functional outcomes. Baseline: New Program Goal status: ongoing  2.  Patient will report at least 25% improvement in overall symptoms and/or function to demonstrate improved functional mobility Baseline: 0% better 08/23/23: 60% improvement  Goal status: MET  3.  Patient will improve R and L trunk side flexion to reach at least the distal 1/3 of the thigh to demonstrate improved spinal mobility. Baseline: mid-thigh R and L Goal status: ongoing    LONG TERM GOALS: 10/06/2023  Patient will report at least 50% improvement in overall symptoms and/or function to demonstrate improved functional mobility Baseline: 0% better 08/30/23: 50% better Goal status: MET  2.  Patient will walking item on the Patient Specific Functional Scale to by at least 2 points. Baseline: 6 points 08/30/23: 6 points Goal status: IN PROGRESS  3.  Patient will sitting item on the Patient Specific Functional Scale to by at least 2 points. Baseline: 6 points 08/30/23: 5 points Goal status: IN PROGRESS  PLAN:  PT FREQUENCY: 2x/week  PT DURATION: 12 weeks  PLANNED INTERVENTIONS: 97110-Therapeutic exercises, 97530- Therapeutic activity, 97112- Neuromuscular re-education, [phone]- Self Care, 02859- Manual therapy, [phone]- Gait training, [phone]-  Orthotic Fit/training, [phone]- Canalith repositioning, [id number]- Aquatic Therapy, [id number]- Electrical stimulation (unattended), [phone]- Ionotophoresis 4mg /ml Dexamethasone , Patient/Family education, Balance training, Stair training, Taping, Dry Needling, Joint mobilization, Joint manipulation, Spinal manipulation, Spinal mobilization, Cryotherapy, and Moist heat     Lamarr Price, PTA 08/30/23 12:38 PM  Lucie Meeter, PT, DPT, ATC 01/09/24 3:03 PM

## 2023-08-31 ENCOUNTER — Ambulatory Visit (INDEPENDENT_AMBULATORY_CARE_PROVIDER_SITE_OTHER): Admitting: Physician Assistant

## 2023-08-31 ENCOUNTER — Encounter: Payer: Self-pay | Admitting: Physician Assistant

## 2023-08-31 VITALS — BP 146/88 | HR 106 | Ht 73.0 in | Wt 218.0 lb

## 2023-08-31 DIAGNOSIS — M79604 Pain in right leg: Secondary | ICD-10-CM

## 2023-08-31 DIAGNOSIS — G8929 Other chronic pain: Secondary | ICD-10-CM | POA: Insufficient documentation

## 2023-08-31 DIAGNOSIS — M25551 Pain in right hip: Secondary | ICD-10-CM | POA: Diagnosis not present

## 2023-08-31 DIAGNOSIS — M545 Low back pain, unspecified: Secondary | ICD-10-CM | POA: Diagnosis not present

## 2023-08-31 MED ORDER — DULOXETINE HCL 30 MG PO CPEP: ORAL_CAPSULE | ORAL | 0 refills | Status: DC

## 2023-08-31 MED ORDER — TRAMADOL HCL 50 MG PO TABS: ORAL_TABLET | ORAL | 0 refills | Status: DC

## 2023-08-31 MED ORDER — METHYLPREDNISOLONE SODIUM SUCC 40 MG IJ SOLR
40.0000 mg | Freq: Once | INTRAMUSCULAR | Status: AC
  Administered 2023-08-31: 40 mg via INTRAMUSCULAR

## 2023-08-31 NOTE — Progress Notes (Signed)
 Established Patient Office Visit  Subjective   Patient ID: Harold Edwards, male    DOB: Feb 23, 1955  Age: 68 y.o. MRN: [id number]  Chief Complaint  Patient presents with   Medical Management of Chronic Issues    Rt low back pain , pt states he has had chronic low back pain  for over 30 yrs , pt states he has had back surgery to remove bone spurs , pt states h goes to physical therapy and it has not helped with the pain at all.    HPI Pt is a 68 yo male who presents to the clinic with his wife to discuss new right sided hip and lateral leg pain. Pt has a long history of chronic low back pain and radiculopathy. He did have surgery with Dr. Joshua to remove bone spurs in back and open up spinal cord. He had immediate success with no more right leg pain after surgery but continued to have chronic low back pain. PT helps minimally. He is taking celebrex  daily and helps some. He does have tramadol  that he uses as needed which is helpful. For the last 6-8 months he has had new right sided hip and lateral leg pain. No injury. Worse with walking and standing. Better with rest. He is open to anything to help with daily pain.    ROS See HPI.    Objective:     BP (!) 146/88   Pulse (!) 106   Ht 6' 1 (1.854 m)   Wt 218 lb (98.9 kg)   SpO2 100%   BMI 28.76 kg/m  BP Readings from Last 3 Encounters:  08/31/23 (!) 146/88  02/21/23 (!) 143/81  02/14/23 122/77   Wt Readings from Last 3 Encounters:  08/31/23 218 lb (98.9 kg)  02/21/23 221 lb 14.4 oz (100.7 kg)  02/14/23 228 lb 0.6 oz (103.4 kg)      Physical Exam Constitutional:      Appearance: Normal appearance.  Cardiovascular:     Rate and Rhythm: Normal rate.  Pulmonary:     Effort: Pulmonary effort is normal.  Musculoskeletal:     Comments: Pain to palpation over greater trochanter, right side.  5/5 strength lower extremity, bilateral.   Neurological:     General: No focal deficit present.     Mental Status: He is alert and  oriented to person, place, and time.  Psychiatric:        Mood and Affect: Mood normal.    Aspiration/Injection Procedure Note Harold Edwards [id number] 01/07/1956  Procedure: Injection Indications: pain  Procedure Details Consent: Risks of procedure as well as the alternatives and risks of each were explained to the (patient/caregiver).  Consent for procedure obtained. Time Out: Verified patient identification, verified procedure, site/side was marked, verified correct patient position, special equipment/implants available, medications/allergies/relevent history reviewed, required imaging and test results available.  Performed   Local Anesthesia Used:Lidocaine 1% plain; 9mL INjected 40mg  of depo medrol  into right bursa with 22g needle without complication.  A sterile dressing was applied.  Patient did tolerate procedure well. Estimated blood loss: none  Vermell Bologna 09/05/2023,     The 10-year ASCVD risk score (Arnett DK, et al., 2019) is: 18.3%    Assessment & Plan:  SABRASABRAKhoury was seen today for medical management of chronic issues.  Diagnoses and all orders for this visit:  Right hip pain -     methylPREDNISolone  sodium succinate (SOLU-MEDROL ) 40 mg/mL injection 40 mg  Right leg pain -  methylPREDNISolone  sodium succinate (SOLU-MEDROL ) 40 mg/mL injection 40 mg  Chronic bilateral low back pain without sciatica -     traMADol  (ULTRAM ) 50 MG tablet; Take one tablet as needed every 12 hours for moderate to severe pain. -     DULoxetine  (CYMBALTA ) 30 MG capsule; Take one tablet daily for chronic pain. -     methylPREDNISolone  sodium succinate (SOLU-MEDROL ) 40 mg/mL injection 40 mg   For chronic low back pain start cymbalta  with the celebrex  Tramadol  refilled as needed for break through pain Suspect new hip bursitis Will try hip injection and see if helpful HO for bursitis exercises given Follow up in 2 weeks    Vermell Bologna, PA-C

## 2023-08-31 NOTE — Patient Instructions (Addendum)
 Take one tablet daily for chronic pain of cymbalta . Continue celebrex .  Tramadol  as needed.   Hip Bursitis  Hip bursitis is swelling of one or more fluid-filled sacs (bursae) in your hip joint. If the bursa becomes irritated, it can fill with extra fluid and become swollen. This condition can cause pain, and your symptoms may come and go over time. What are the causes? Repeated use of your hip muscles. Injury to the hip. Weak butt muscles. Bone spurs. Infection. In some cases, the cause may not be known. What increases the risk? Having a past hip injury or hip surgery. Having a condition, such as arthritis, gout, diabetes, or thyroid  disease. Having spine problems. Having one leg that is shorter than the other. Running a lot or doing long-distance running. Playing sports where there is a risk of injury or falling, such as football, martial arts, or skiing. What are the signs or symptoms? Symptoms may come and go, and they often include: Pain in the hip or groin area. Pain may get worse when you move your hip. Tenderness and swelling of the hip. In rare cases, the bursa may become infected. If this happens, you may: Get a fever. Have warmth and redness in the hip area. How is this treated? This condition is treated by: Resting your hip. Icing your hip. Wrapping the hip area with an elastic bandage (compression wrap). Keeping the hip raised. Other treatments may include: Using crutches, a cane, or a walker. Medicines. Draining fluid out of the bursa. Surgery to take out a bursa. This is rare. Long-term treatment may include: Doing exercises to help your strength and flexibility. Lifestyle changes like losing weight to lessen the strain on your hip. Follow these instructions at home: Managing pain, stiffness, and swelling     If told, put ice on the painful area. To do this: Put ice in a plastic bag. Place a towel between your skin and the bag. Leave the ice on for 20  minutes, 2-3 times a day. Take off the ice if your skin turns bright red. This is very important. If you cannot feel pain, heat, or cold, you have a greater risk of damage to the area. Raise your hip by putting a pillow under your hips while you lie down. Stop if you feel pain. If told, put heat on the affected area. Do this as often as told by your doctor. Use the heat source that your doctor recommends, such as a moist heat pack or a heating pad. Place a towel between your skin and the heat source. Leave the heat on for 20-30 minutes. Take off the heat if your skin turns bright red. This is very important. If you cannot feel pain, heat, or cold, you have a greater risk of getting burned. Activity Do not use your hip to support your body weight until your doctor says that you can. Use crutches, a cane, or a walker as told by your doctor. If the affected leg is one that you use to drive, ask your doctor if it is safe to drive. Rest and protect your hip as much as you can until you feel better. Return to your normal activities when your doctor says that it is safe. Do exercises as told by your doctor. General instructions Take over-the-counter and prescription medicines only as told by your doctor. Gently rub and stretch your injured area as often as is comfortable. Wear elastic bandages only as told by your doctor. If one of your  legs is shorter than the other, get fitted for a shoe insert or orthotic. Keep a healthy weight. Follow instructions from your doctor. Keep all follow-up visits. How is this prevented? Exercise regularly or as told by your doctor. Wear the right shoes for the sport you play and for daily activities. Warm up and stretch before being active. Cool down and stretch after being active. Take breaks often from repeated activity. Avoid activities that bother your hip or cause pain. Avoid sitting down for a long time. Where to find more information American Academy of  Orthopaedic Surgeons: orthoinfo.aaos.org Contact a doctor if: You have a fever. You have new symptoms. You have trouble walking or doing everyday activities. You have pain that gets worse or does not get better with medicine. The skin around your hip is red. You get a feeling of warmth in your hip area. Get help right away if: You cannot move your hip. You have very bad pain. You cannot control the muscles in your feet. Summary Hip bursitis is swelling of one or more fluid-filled sacs (bursae) in your hip joint. Symptoms often come and go over time. This condition is often treated by resting and icing the hip. It also may help to keep the area raised and wrapped in an elastic bandage. Other treatments may be needed. This information is not intended to replace advice given to you by your health care provider. Make sure you discuss any questions you have with your health care provider. Document Revised: 01/20/2021 Document Reviewed: 01/20/2021 Elsevier Patient Education  2024 [employer].

## 2023-09-01 ENCOUNTER — Ambulatory Visit

## 2023-09-02 ENCOUNTER — Other Ambulatory Visit (HOSPITAL_COMMUNITY): Payer: Self-pay

## 2023-09-06 ENCOUNTER — Ambulatory Visit

## 2023-09-08 ENCOUNTER — Encounter

## 2023-09-08 ENCOUNTER — Telehealth: Payer: Self-pay

## 2023-09-08 ENCOUNTER — Other Ambulatory Visit (HOSPITAL_COMMUNITY): Payer: Self-pay

## 2023-09-08 NOTE — Telephone Encounter (Signed)
 Pharmacy Patient Advocate Encounter   Received notification from CoverMyMeds that prior authorization for Tramadol  50mg  tabs is required/requested.   Insurance verification completed.   The patient is insured through [facility] .   Per test claim: PA required; PA started via CoverMyMeds. KEY [id number] . Waiting for clinical questions to populate.

## 2023-09-08 NOTE — Telephone Encounter (Signed)
 Clinical questions answered and PA submitted.

## 2023-09-08 NOTE — Telephone Encounter (Signed)
 Pharmacy Patient Advocate Encounter  Received notification from [employer] that Prior Authorization for Tramadol  50mg  tabs has been APPROVED from 09/08/23 to 10/08/23   PA #/Case ID/Reference #: [id number]  Left a message at [place] Prior to notify of the approval

## 2023-09-12 ENCOUNTER — Ambulatory Visit

## 2023-09-14 ENCOUNTER — Ambulatory Visit (INDEPENDENT_AMBULATORY_CARE_PROVIDER_SITE_OTHER): Admitting: Sports Medicine

## 2023-09-14 ENCOUNTER — Encounter

## 2023-09-14 DIAGNOSIS — M5416 Radiculopathy, lumbar region: Secondary | ICD-10-CM | POA: Diagnosis not present

## 2023-09-14 DIAGNOSIS — M7061 Trochanteric bursitis, right hip: Secondary | ICD-10-CM | POA: Diagnosis not present

## 2023-09-14 MED ORDER — PREDNISONE 50 MG PO TABS: ORAL_TABLET | ORAL | 0 refills | Status: DC

## 2023-09-14 NOTE — Assessment & Plan Note (Signed)
 In addition to the upper lumbar pain he also had what sounds to be some trochanteric bursitis, injected by Jade, the discrete lateral hip pain is significantly better.

## 2023-09-14 NOTE — Progress Notes (Signed)
    Procedures performed today:    None.  Independent interpretation of notes and tests performed by another provider:   None.  Brief History, Exam, Impression, and Recommendations:    Lumbar back pain with radiculopathy affecting right lower extremity This is a very pleasant 68 year old male, I have not seen him in 4 years. We usually saw him with lumbar disc disease, L4 radiculitis, he had responded well to several L4-L5 epidurals. He did well for a period of time and then had a recurrence of discomfort, ultimately saw Dr. Joshua, there was a large L2-L3 herniated disc. He did up with a right L2-L3 extraforaminal microdiscectomy by Dr. Joshua earlier this year with good resolution of upper lumbar radicular symptoms. Unfortunately continues to have some lower lumbar symptoms with mostly axial back pain. He has been through over 6 weeks of physical therapy, he got x-rays this year with Dr. Joshua. Dr. Joshua told him nothing surgical was evident and so he is referred here again for nonsurgical treatment. We will start conservatively again, 5 days of prednisone , he will hold off on his Celebrex  and restart after the prednisone , he can continue tramadol  as needed for breakthrough pain. I would like an updated MRI. I will see him back for the MRI results and at that point we will consider gabapentin  versus an updated epidural.  Trochanteric bursitis, right hip In addition to the upper lumbar pain he also had what sounds to be some trochanteric bursitis, injected by Jade, the discrete lateral hip pain is significantly better.    ____________________________________________ Debby PARAS. Curtis, M.D., ABFM., CAQSM., AME. Primary Care and Sports Medicine [facility]  Adjunct Professor of [facility] Medicine  [employer]  Restaurant manager, fast food

## 2023-09-14 NOTE — Assessment & Plan Note (Addendum)
 This is a very pleasant 68 year old male, I have not seen him in 4 years. We usually saw him with lumbar disc disease, L4 radiculitis, he had responded well to several L4-L5 epidurals. He did well for a period of time and then had a recurrence of discomfort, ultimately saw Dr. Joshua, there was a large L2-L3 herniated disc. He did up with a right L2-L3 extraforaminal microdiscectomy by Dr. Joshua earlier this year with good resolution of upper lumbar radicular symptoms. Unfortunately continues to have some lower lumbar symptoms with mostly axial back pain. He has been through over 6 weeks of physical therapy, he got x-rays this year with Dr. Joshua. Dr. Joshua told him nothing surgical was evident and so he is referred here again for nonsurgical treatment. We will start conservatively again, 5 days of prednisone , he will hold off on his Celebrex  and restart after the prednisone , he can continue tramadol  as needed for breakthrough pain. I would like an updated MRI. I will see him back for the MRI results and at that point we will consider gabapentin  versus an updated epidural.

## 2023-09-19 ENCOUNTER — Ambulatory Visit (INDEPENDENT_AMBULATORY_CARE_PROVIDER_SITE_OTHER)

## 2023-09-19 DIAGNOSIS — M47816 Spondylosis without myelopathy or radiculopathy, lumbar region: Secondary | ICD-10-CM | POA: Diagnosis not present

## 2023-09-19 DIAGNOSIS — M5416 Radiculopathy, lumbar region: Secondary | ICD-10-CM

## 2023-09-19 DIAGNOSIS — G8929 Other chronic pain: Secondary | ICD-10-CM | POA: Diagnosis not present

## 2023-09-19 DIAGNOSIS — M5136 Other intervertebral disc degeneration, lumbar region with discogenic back pain only: Secondary | ICD-10-CM | POA: Diagnosis not present

## 2023-09-19 DIAGNOSIS — M5126 Other intervertebral disc displacement, lumbar region: Secondary | ICD-10-CM | POA: Diagnosis not present

## 2023-09-19 DIAGNOSIS — M5137 Other intervertebral disc degeneration, lumbosacral region with discogenic back pain only: Secondary | ICD-10-CM | POA: Diagnosis not present

## 2023-09-19 DIAGNOSIS — M545 Low back pain, unspecified: Secondary | ICD-10-CM

## 2023-09-22 ENCOUNTER — Encounter: Payer: Self-pay | Admitting: Sports Medicine

## 2023-09-26 ENCOUNTER — Other Ambulatory Visit: Payer: Self-pay | Admitting: Physician Assistant

## 2023-09-26 ENCOUNTER — Ambulatory Visit: Payer: Self-pay | Admitting: Sports Medicine

## 2023-09-26 ENCOUNTER — Encounter: Payer: Self-pay | Admitting: Sports Medicine

## 2023-09-26 DIAGNOSIS — M545 Low back pain, unspecified: Secondary | ICD-10-CM

## 2023-09-27 ENCOUNTER — Other Ambulatory Visit: Payer: Self-pay | Admitting: Sports Medicine

## 2023-09-27 DIAGNOSIS — M545 Low back pain, unspecified: Secondary | ICD-10-CM

## 2023-09-27 DIAGNOSIS — G8929 Other chronic pain: Secondary | ICD-10-CM

## 2023-09-27 MED ORDER — TRAMADOL HCL 50 MG PO TABS: ORAL_TABLET | ORAL | 0 refills | Status: DC

## 2023-09-27 NOTE — Telephone Encounter (Signed)
 Requesting rx rf of Tramadol  50mg   Last written 08/31/2023 Last OV 08/06/225 Upcoming appt 09/30/2023

## 2023-09-28 NOTE — Telephone Encounter (Signed)
 Last read by Lamar Sheldon at 9:17AM on 09/28/2023.

## 2023-09-30 ENCOUNTER — Ambulatory Visit (INDEPENDENT_AMBULATORY_CARE_PROVIDER_SITE_OTHER): Admitting: Sports Medicine

## 2023-09-30 DIAGNOSIS — M5416 Radiculopathy, lumbar region: Secondary | ICD-10-CM

## 2023-09-30 MED ORDER — GABAPENTIN 300 MG PO CAPS: ORAL_CAPSULE | ORAL | 3 refills | Status: AC

## 2023-09-30 NOTE — Progress Notes (Signed)
    Procedures performed today:    None.  Independent interpretation of notes and tests performed by another provider:   None.  Brief History, Exam, Impression, and Recommendations:    Lumbar back pain with radiculopathy affecting right lower extremity Very pleasant 68 year old male, lumbar spondylosis, historically L4 radiculitis and has responded well historically to several L4-L5 epidurals, he did well for a period of time and then had recurrence of discomfort, saw Dr. Joshua, he had a large L2-L3 herniated disc and ended up with a right L2-L3 microdiscectomy. He had good resolution of upper lumbar radicular symptoms Unfortunately continued to have lower lumbar axial pain mostly. We did some prednisone , home PT. Unfortunately he did not improve. He takes tramadol  occasionally. He does have an updated MRI. He did not tolerate Cymbalta , switching to gabapentin  and I would like consultation with Dr. Rosella.  I spent 40 minutes of total time managing this patient today, this includes chart review, face to face, and non-face to face time.  I spent a great look time discussing his MRI results, as well as the cause and pathophysiology of lumbar spondylosis.  ____________________________________________ Debby PARAS. Curtis, M.D., ABFM., CAQSM., AME. Primary Care and Sports Medicine [facility]  Adjunct Professor of [facility] Medicine  [employer]  Restaurant manager, fast food

## 2023-09-30 NOTE — Assessment & Plan Note (Signed)
 Very pleasant 68 year old male, lumbar spondylosis, historically L4 radiculitis and has responded well historically to several L4-L5 epidurals, he did well for a period of time and then had recurrence of discomfort, saw Dr. Joshua, he had a large L2-L3 herniated disc and ended up with a right L2-L3 microdiscectomy. He had good resolution of upper lumbar radicular symptoms Unfortunately continued to have lower lumbar axial pain mostly. We did some prednisone , home PT. Unfortunately he did not improve. He takes tramadol  occasionally. He does have an updated MRI. He did not tolerate Cymbalta , switching to gabapentin  and I would like consultation with Dr. Rosella.

## 2023-10-11 ENCOUNTER — Encounter: Payer: Self-pay | Admitting: Sports Medicine

## 2023-10-26 ENCOUNTER — Ambulatory Visit: Payer: Self-pay

## 2023-10-26 NOTE — Telephone Encounter (Signed)
 FYI Only or Action Required?: FYI only for provider.  Patient was last seen in primary care on 09/30/2023 by Curtis Debby PARAS, MD.  Called Nurse Triage reporting Cough.  Symptoms began a week ago.  Interventions attempted: OTC medications: cough/congestion med.  Symptoms are: productive cough with yellow mucus, sore chest after coughing, right ear ache gradually worsening.  Triage Disposition: See Physician Within 24 Hours  Patient/caregiver understands and will follow disposition?: Yes              Copied from CRM [id number]. Topic: Clinical - Red Word Triage >> Oct 26, 2023 10:30 AM Susanna ORN wrote: Red Word that prompted transfer to Nurse Triage: Patient wanting to be seen by Dr. Antoniette today, if possible. States he thinks that he has gotten an upper respiratory infection from his grandkids. States his chest hurts, coughing, mucus/phlegm, & it hasn't gotten any better. No fever. Reason for Disposition  Earache  Answer Assessment - Initial Assessment Questions 1. ONSET: When did the cough begin?      Last week, worsening.  2. SEVERITY: How bad is the cough today?      Hacking cough, he states he thinks it may be breaking up with yellow mucus coming up. He states his chest will hurt or be sore after coughing. He adds that it feels like laryngitis starting.  3. SPUTUM: Describe the color of your sputum (e.g., none, dry cough; clear, white, yellow, green)     Yellow.  4. HEMOPTYSIS: Are you coughing up any blood? If Yes, ask: How much? (e.g., flecks, streaks, tablespoons, etc.)     No.  5. DIFFICULTY BREATHING: Are you having difficulty breathing? If Yes, ask: How bad is it? (e.g., mild, moderate, severe)      No, he states his breathing is fine.  6. FEVER: Do you have a fever? If Yes, ask: What is your temperature, how was it measured, and when did it start?     No.  7. CARDIAC HISTORY: Do you have any history of heart disease? (e.g.,  heart attack, congestive heart failure)      No.  8. LUNG HISTORY: Do you have any history of lung disease?  (e.g., pulmonary embolus, asthma, emphysema)     No.  9. PE RISK FACTORS: Do you have a history of blood clots? (or: recent major surgery, recent prolonged travel, bedridden)     No.  10. OTHER SYMPTOMS: Do you have any other symptoms? (e.g., runny nose, wheezing, chest pain)       Right ear ache.  11. PREGNANCY: Is there any chance you are pregnant? When was your last menstrual period?       N/A.  12. TRAVEL: Have you traveled out of the country in the last month? (e.g., travel history, exposures)       He was around his daughter and grandkids who had URI (exposed 10/16/23)  Protocols used: Cough - Acute Productive-A-AH

## 2023-10-27 ENCOUNTER — Ambulatory Visit (INDEPENDENT_AMBULATORY_CARE_PROVIDER_SITE_OTHER): Admitting: Medical-Surgical

## 2023-10-27 ENCOUNTER — Encounter: Payer: Self-pay | Admitting: Medical-Surgical

## 2023-10-27 VITALS — BP 144/93 | HR 113 | Temp 98.3°F | Ht 73.0 in | Wt 217.0 lb

## 2023-10-27 DIAGNOSIS — J40 Bronchitis, not specified as acute or chronic: Secondary | ICD-10-CM

## 2023-10-27 DIAGNOSIS — J329 Chronic sinusitis, unspecified: Secondary | ICD-10-CM

## 2023-10-27 MED ORDER — PREDNISONE 50 MG PO TABS: 50.0000 mg | ORAL_TABLET | Freq: Every day | ORAL | 0 refills | Status: DC

## 2023-10-27 MED ORDER — ALBUTEROL SULFATE (2.5 MG/3ML) 0.083% IN NEBU: 2.5000 mg | INHALATION_SOLUTION | Freq: Four times a day (QID) | RESPIRATORY_TRACT | 1 refills | Status: AC | PRN

## 2023-10-27 MED ORDER — DOXYCYCLINE HYCLATE 100 MG PO TABS: 100.0000 mg | ORAL_TABLET | Freq: Two times a day (BID) | ORAL | 0 refills | Status: AC

## 2023-10-27 MED ORDER — BENZONATATE 200 MG PO CAPS: 200.0000 mg | ORAL_CAPSULE | Freq: Three times a day (TID) | ORAL | 0 refills | Status: DC | PRN

## 2023-10-27 MED ORDER — HYDROCOD POLI-CHLORPHE POLI ER 10-8 MG/5ML PO SUER: 5.0000 mL | Freq: Two times a day (BID) | ORAL | 0 refills | Status: DC | PRN

## 2023-10-27 NOTE — Progress Notes (Signed)
 Established patient visit   History of Present Illness   Discussed the use of AI scribe software for clinical note transcription with the patient, who gave verbal consent to proceed.  History of Present Illness   Harold Edwards is a 68 year old male who presents with persistent cough and chest pain.  Cough and sputum production - Persistent cough with green sputum - Cough has progressively worsened over the past 10 days - Cough is worse when lying down - Over-the-counter medications have been ineffective - Previously prescribed a nebulizer in January for similar symptoms but was unable to get a machine or mask  Chest and back pain - Significant chest and back pain associated with coughing - Back pain is particularly severe - History of back surgery exacerbates discomfort when coughing  Sleep disturbance - Significant sleep disturbance due to cough - Slept only about one hour last night  Wheezing - Wheezing present, especially at night - Wheezing confirmed by his wife  Ear symptoms - 'Popping' and 'bubbling' noises in right ear, especially when lying on right side - No current ear pain - Ear symptoms reminiscent of a past ear infection  Associated symptoms and allergies - No fevers or chills - Allergies to amoxicillin and clavulanic acid       Physical Exam   Physical Exam Vitals and nursing note reviewed.  Constitutional:      General: He is not in acute distress.    Appearance: Normal appearance. He is not ill-appearing.  HENT:     Head: Normocephalic and atraumatic.     Right Ear: Ear canal and external ear normal. There is no impacted cerumen.     Left Ear: Tympanic membrane, ear canal and external ear normal. There is no impacted cerumen.     Nose: No congestion or rhinorrhea.     Mouth/Throat:     Mouth: Mucous membranes are moist.     Pharynx: Posterior oropharyngeal erythema present. No oropharyngeal exudate.  Eyes:     General: No scleral  icterus.       Right eye: No discharge.        Left eye: No discharge.     Extraocular Movements: Extraocular movements intact.     Conjunctiva/sclera: Conjunctivae normal.     Pupils: Pupils are equal, round, and reactive to light.  Cardiovascular:     Rate and Rhythm: Normal rate and regular rhythm.     Pulses: Normal pulses.     Heart sounds: Normal heart sounds. No murmur heard.    No friction rub. No gallop.  Pulmonary:     Effort: Pulmonary effort is normal. No respiratory distress.     Breath sounds: Wheezing (scattered) present.  Musculoskeletal:     Cervical back: Neck supple.  Lymphadenopathy:     Cervical: No cervical adenopathy.  Skin:    General: Skin is warm and dry.  Neurological:     Mental Status: He is alert and oriented to person, place, and time.  Psychiatric:        Mood and Affect: Mood normal.        Behavior: Behavior normal.        Thought Content: Thought content normal.        Judgment: Judgment normal.    Assessment & Plan   Problem List Items Addressed This Visit   None Visit Diagnoses       Sinobronchitis    -  Primary   Relevant Medications  doxycycline  (VIBRA -TABS) 100 MG tablet   chlorpheniramine-HYDROcodone (TUSSIONEX) 10-8 MG/5ML   albuterol  (PROVENTIL ) (2.5 MG/3ML) 0.083% nebulizer solution   benzonatate  (TESSALON ) 200 MG capsule   predniSONE  (DELTASONE ) 50 MG tablet      Assessment and Plan    Sinobronchitis Greater than 7 days of symptoms with continued worsening not responsive to OTC measures. Back surgery may exacerbate back pain with coughing. - Prescribed doxycycline  100 mg twice daily for 7 days. - Prescribed Tussionex cough syrup every 12 hours as needed for cough, noting potential drowsiness. - Prescribed Tessalon  Perles for daytime cough management. - Provided albuterol  nebulizer solution and nebulizer machine for respiratory support.   Follow up   Return if symptoms worsen or fail to  improve. __________________________________ Zada FREDRIK Palin, DNP, APRN, FNP-BC Primary Care and Sports Medicine [facility] [place]

## 2023-11-01 DIAGNOSIS — M51361 Other intervertebral disc degeneration, lumbar region with lower extremity pain only: Secondary | ICD-10-CM | POA: Diagnosis not present

## 2023-11-01 DIAGNOSIS — M7918 Myalgia, other site: Secondary | ICD-10-CM | POA: Diagnosis not present

## 2023-11-01 DIAGNOSIS — G894 Chronic pain syndrome: Secondary | ICD-10-CM | POA: Diagnosis not present

## 2023-11-01 DIAGNOSIS — M4726 Other spondylosis with radiculopathy, lumbar region: Secondary | ICD-10-CM | POA: Diagnosis not present

## 2023-11-01 DIAGNOSIS — M5459 Other low back pain: Secondary | ICD-10-CM | POA: Diagnosis not present

## 2023-11-01 DIAGNOSIS — M48061 Spinal stenosis, lumbar region without neurogenic claudication: Secondary | ICD-10-CM | POA: Diagnosis not present

## 2023-11-02 ENCOUNTER — Encounter: Payer: Self-pay | Admitting: Medical-Surgical

## 2023-11-02 DIAGNOSIS — J329 Chronic sinusitis, unspecified: Secondary | ICD-10-CM

## 2023-11-03 MED ORDER — HYDROCOD POLI-CHLORPHE POLI ER 10-8 MG/5ML PO SUER: 5.0000 mL | Freq: Two times a day (BID) | ORAL | 0 refills | Status: DC | PRN

## 2023-11-03 MED ORDER — BENZONATATE 200 MG PO CAPS: 200.0000 mg | ORAL_CAPSULE | Freq: Three times a day (TID) | ORAL | 0 refills | Status: DC | PRN

## 2023-11-03 NOTE — Telephone Encounter (Signed)
 Last read by Lamar Sheldon at 5:29PM on 11/03/2023.

## 2023-11-03 NOTE — Telephone Encounter (Signed)
 A refill of Tessalon  Perles and Tussionex has been sent to the pharmacy for cough management.  If symptoms continue to worsen or fail to improve, he will need to return for further evaluation.  ___________________________________________ Zada FREDRIK Palin, DNP, APRN, FNP-BC Primary Care and Sports Medicine [facility]

## 2023-11-03 NOTE — Telephone Encounter (Signed)
 Pt is follow up to see what medication the dr is going to send in now and when it will be at the pharmacy. Please advise.

## 2023-11-03 NOTE — Telephone Encounter (Signed)
 Spoke to patient, patient states that he doesn't feel he needs to come into office of have a chest x ray and would like to get some medication for his cough. Patient also states that he is unable to sleep at night, please advise,thanks.

## 2023-11-07 ENCOUNTER — Ambulatory Visit: Payer: Self-pay | Admitting: *Deleted

## 2023-11-07 ENCOUNTER — Ambulatory Visit (INDEPENDENT_AMBULATORY_CARE_PROVIDER_SITE_OTHER)

## 2023-11-07 ENCOUNTER — Encounter: Payer: Self-pay | Admitting: Physician Assistant

## 2023-11-07 ENCOUNTER — Ambulatory Visit: Payer: Self-pay | Admitting: Physician Assistant

## 2023-11-07 ENCOUNTER — Ambulatory Visit (INDEPENDENT_AMBULATORY_CARE_PROVIDER_SITE_OTHER): Admitting: Physician Assistant

## 2023-11-07 VITALS — BP 133/75 | HR 114 | Temp 98.2°F | Ht 73.0 in | Wt 217.0 lb

## 2023-11-07 DIAGNOSIS — M545 Low back pain, unspecified: Secondary | ICD-10-CM | POA: Diagnosis not present

## 2023-11-07 DIAGNOSIS — J209 Acute bronchitis, unspecified: Secondary | ICD-10-CM | POA: Diagnosis not present

## 2023-11-07 DIAGNOSIS — R059 Cough, unspecified: Secondary | ICD-10-CM | POA: Diagnosis not present

## 2023-11-07 DIAGNOSIS — R0602 Shortness of breath: Secondary | ICD-10-CM | POA: Diagnosis not present

## 2023-11-07 DIAGNOSIS — R0989 Other specified symptoms and signs involving the circulatory and respiratory systems: Secondary | ICD-10-CM | POA: Diagnosis not present

## 2023-11-07 DIAGNOSIS — R5383 Other fatigue: Secondary | ICD-10-CM | POA: Diagnosis not present

## 2023-11-07 DIAGNOSIS — G8929 Other chronic pain: Secondary | ICD-10-CM | POA: Diagnosis not present

## 2023-11-07 MED ORDER — TRAMADOL HCL 50 MG PO TABS: ORAL_TABLET | ORAL | 0 refills | Status: DC

## 2023-11-07 MED ORDER — PREDNISONE 20 MG PO TABS: ORAL_TABLET | ORAL | 0 refills | Status: DC

## 2023-11-07 NOTE — Patient Instructions (Signed)

## 2023-11-07 NOTE — Telephone Encounter (Signed)
 FYI Only or Action Required?: FYI only for provider.  Patient was last seen in primary care on 10/27/2023 by Willo Mini, NP.  Called Nurse Triage reporting Cough.  Symptoms began several weeks ago.  Interventions attempted: OTC medications: tylenol   and Prescription medications: see chart for antibiotics, cough medications, inhalers.  Symptoms are: gradually worsening.  Triage Disposition: See HCP Within 4 Hours (Or PCP Triage)  Patient/caregiver understands and will follow disposition?: Yes             Copied from CRM [id number]. Topic: Clinical - Red Word Triage >> Nov 07, 2023  7:52 AM Delon HERO wrote: Red Word that prompted transfer to Nurse Triage:  Patient has been cough for 3 weeks, Cough syrup not working,  Inhaler not working  Back hurting Hip is hurting  Last office visit 10/27/2023 Reporting not getting better Reason for Disposition  [1] MILD difficulty breathing (e.g., minimal/no SOB at rest, SOB with walking, pulse < 100) AND [2] still present when not coughing  Answer Assessment - Initial Assessment Questions Appt today with PCP. Patient reports last OV he waited greater than 35 minutes to be seen in office. Recommended ED if sx worsen.     1. ONSET: When did the cough begin?      On going x 3 weeks and has taken antibiotics 2. SEVERITY: How bad is the cough today?      Getting worse 3. SPUTUM: Describe the color of your sputum (e.g., none, dry cough; clear, white, yellow, green)     Yellow green  4. HEMOPTYSIS: Are you coughing up any blood? If Yes, ask: How much? (e.g., flecks, streaks, tablespoons, etc.)     na 5. DIFFICULTY BREATHING: Are you having difficulty breathing? If Yes, ask: How bad is it? (e.g., mild, moderate, severe)      With coughing SOB 6. FEVER: Do you have a fever? If Yes, ask: What is your temperature, how was it measured, and when did it start?     no 7. CARDIAC HISTORY: Do you have any history of heart  disease? (e.g., heart attack, congestive heart failure)      na 8. LUNG HISTORY: Do you have any history of lung disease?  (e.g., pulmonary embolus, asthma, emphysema)     na 9. PE RISK FACTORS: Do you have a history of blood clots? (or: recent major surgery, recent prolonged travel, bedridden)     na 10. OTHER SYMPTOMS: Do you have any other symptoms? (e.g., runny nose, wheezing, chest pain)       Headache cough worsening chest discomfort, SOB with coughing  11. PREGNANCY: Is there any chance you are pregnant? When was your last menstrual period?       na 12. TRAVEL: Have you traveled out of the country in the last month? (e.g., travel history, exposures)       na  Protocols used: Cough - Acute Productive-A-AH

## 2023-11-07 NOTE — Progress Notes (Signed)
 No signs of pneumonia. Treatment plan stays with just prednisone  taper.

## 2023-11-07 NOTE — Telephone Encounter (Signed)
 Patient seen today with vermell Bologna,

## 2023-11-07 NOTE — Progress Notes (Signed)
 Established Patient Office Visit  Subjective   Patient ID: Yojan Paskett, male    DOB: 1955/07/01  Age: 68 y.o. MRN: [id number]  Chief Complaint  Patient presents with   Cough    HPI Discussed the use of AI scribe software for clinical note transcription with the patient, who gave verbal consent to proceed.  History of Present Illness Mathews Stuhr is a 68 year old male who presents with persistent cough and back pain following treatment for sinobronchitis.  Cough and sputum production - Persistent cough since at least October 27, 2023 - Cough is productive of yellow sputum at times, occasionally clear - Cough worsens when lying down, typically within 1 to 1.25 hours - No improvement in symptoms despite treatment with prednisone  50 mg once daily, doxycycline  100 mg twice daily for ten days, benoxinate, and hydrocodone syrup - No fevers - No shortness of breath, but breathing is difficult due to frequent coughing  Back and hip pain - Significant back pain attributed to persistent coughing - Pain radiates from lower back into hip - Described as 'freaking killing me' - Tramadol  provides some relief    ROS See HPI.    Objective:     BP 133/75 (BP Location: Right Arm, Patient Position: Sitting)   Pulse (!) 114   Temp 98.2 F (36.8 C) (Oral)   Ht 6' 1 (1.854 m)   Wt 217 lb (98.4 kg)   SpO2 100%   BMI 28.63 kg/m  BP Readings from Last 3 Encounters:  11/07/23 133/75  10/27/23 (!) 144/93  08/31/23 (!) 146/88   Wt Readings from Last 3 Encounters:  11/07/23 217 lb (98.4 kg)  10/27/23 217 lb (98.4 kg)  08/31/23 218 lb (98.9 kg)      Physical Exam Constitutional:      Appearance: Normal appearance.  HENT:     Head: Normocephalic.     Right Ear: Tympanic membrane, ear canal and external ear normal. There is no impacted cerumen.     Left Ear: Tympanic membrane and ear canal normal. There is no impacted cerumen.     Nose: Nose normal. No congestion or  rhinorrhea.     Mouth/Throat:     Mouth: Mucous membranes are moist.     Pharynx: Posterior oropharyngeal erythema present. No oropharyngeal exudate.  Eyes:     Extraocular Movements: Extraocular movements intact.     Conjunctiva/sclera: Conjunctivae normal.     Pupils: Pupils are equal, round, and reactive to light.  Cardiovascular:     Rate and Rhythm: Normal rate and regular rhythm.  Pulmonary:     Effort: Pulmonary effort is normal.     Breath sounds: Normal breath sounds. No wheezing or rhonchi.  Musculoskeletal:     Cervical back: Neck supple. No tenderness.  Lymphadenopathy:     Cervical: No cervical adenopathy.  Neurological:     General: No focal deficit present.     Mental Status: He is alert and oriented to person, place, and time.  Psychiatric:        Mood and Affect: Mood normal.      The 10-year ASCVD risk score (Arnett DK, et al., 2019) is: 16.8%    Assessment & Plan:  .Taden was seen today for cough.  Diagnoses and all orders for this visit:  Acute bronchitis, unspecified organism -     DG Chest 2 View; Future -     predniSONE  (DELTASONE ) 20 MG tablet; Take 3 tablets for 3 days, take 2 tablets for  3 days, take 1 tablet for 3 days, take 1/2 tablet for 4 days.  Chronic bilateral low back pain without sciatica -     predniSONE  (DELTASONE ) 20 MG tablet; Take 3 tablets for 3 days, take 2 tablets for 3 days, take 1 tablet for 3 days, take 1/2 tablet for 4 days. -     traMADol  (ULTRAM ) 50 MG tablet; Take one tablet as needed every 12 hours for moderate to severe pain.    Assessment & Plan Persistent cough with possible pneumonia Persistent cough with yellow sputum, unresponsive to doxycycline , prednisone , and hydrocodone syrup. Differential includes multifocal pneumonia. Further investigation needed. - vital signs look great - Order chest x-ray to evaluate for pneumonia or other pulmonary issues. - Prescribe longer prednisone  taper for inflammation and  cough. - Advise continued use of hydrocodone syrup as needed. - Reassess need for additional antibiotics based on chest x-ray results.  Chronic low back pain exacerbated by coughing Chronic low back pain worsened by coughing, extending into the hip. - Prescribe prednisone  taper for inflammation and back pain. - Advise use of tramadol  as needed for pain management.  Right hip and leg pain Right hip and leg pain exacerbated by coughing.     Carsten Carstarphen, PA-C

## 2023-11-22 DIAGNOSIS — M4726 Other spondylosis with radiculopathy, lumbar region: Secondary | ICD-10-CM | POA: Diagnosis not present

## 2023-11-22 DIAGNOSIS — M48061 Spinal stenosis, lumbar region without neurogenic claudication: Secondary | ICD-10-CM | POA: Diagnosis not present

## 2024-02-01 ENCOUNTER — Other Ambulatory Visit: Payer: Self-pay | Admitting: Physician Assistant

## 2024-02-12 LAB — LAB REPORT - SCANNED: EGFR: 76

## 2024-02-14 ENCOUNTER — Ambulatory Visit: Payer: Self-pay

## 2024-02-14 NOTE — Telephone Encounter (Signed)
 The patient is scheduled to see Dr. Alvia on 02/15/24 at 850 am to address the following symptoms.

## 2024-02-14 NOTE — Telephone Encounter (Signed)
 FYI Only or Action Required?: FYI only for provider: appointment scheduled on 1/7.  Patient was last seen in primary care on 11/07/2023 by Antoniette Vermell CROME, PA-C.  Called Nurse Triage reporting Cough.  Symptoms began a week ago.  Interventions attempted: Prescription medications: delsum and Rest, hydration, or home remedies.  Symptoms are: gradually worsening.  Triage Disposition: See Physician Within 24 Hours  Patient/caregiver understands and will follow disposition?: Yes  Copied from CRM #[id number]. Topic: Clinical - Red Word Triage >> Feb 14, 2024  9:53 AM Darshell M wrote: Red Word that prompted transfer to Nurse Triage: Patient seen in ER on 02/12/24 and treated for dehydration and given antibiotics for pneumonia. Patient still having cough and would like prescription cough medicine or something to relieve the coughing. Ear is clogged and pain is worsening since ER visit. Patient's wife calling but she is not with patient. Call patient [phone] Reason for Disposition  [1] Continuous (nonstop) coughing interferes with work or school AND [2] no improvement using cough treatment per Care Advice  Answer Assessment - Initial Assessment Questions Patient seen in [facility] ED 1/4 for possible pneumonia- HR elevated, given 3L NS, morphine and ABX shot.   Patient feeling somewhat better since discharge home but his cough is not improving. He was up all night coughing. Ear pressure and draining on the right side. Draining into his throat and causing worsened coughin. Eyes are watering.   HFU appt with PCP office 1/7 to assess. Pt understands return to ED precautions  1. ONSET: When did the cough begin?      About a week  2. SEVERITY: How bad is the cough today?      Up all night coughing. Barely got sleep  3. SPUTUM: Describe the color of your sputum (e.g., none, dry cough; clear, white, yellow, green)     White yellow 4. HEMOPTYSIS: Are you coughing up any blood? If Yes, ask: How  much? (e.g., flecks, streaks, tablespoons, etc.)     denies 5. DIFFICULTY BREATHING: Are you having difficulty breathing? If Yes, ask: How bad is it? (e.g., mild, moderate, severe)      denies 6. FEVER: Do you have a fever? If Yes, ask: What is your temperature, how was it measured, and when did it start?     Feels warm occasionally 7. CARDIAC HISTORY: Do you have any history of heart disease? (e.g., heart attack, congestive heart failure)      Aortic athero 8. LUNG HISTORY: Do you have any history of lung disease?  (e.g., pulmonary embolus, asthma, emphysema)     Prone to pneumonia and bronchitis 9. PE RISK FACTORS: Do you have a history of blood clots? (or: recent major surgery, recent prolonged travel, bedridden)     denies 10. OTHER SYMPTOMS: Do you have any other symptoms? (e.g., runny nose, wheezing, chest pain)       Ear pressure/ clogged with drainage, scratchy throat, eyes watering  Protocols used: Cough - Acute Productive-A-AH

## 2024-02-15 ENCOUNTER — Encounter: Payer: Self-pay | Admitting: Family Medicine

## 2024-02-15 ENCOUNTER — Ambulatory Visit: Admitting: Family Medicine

## 2024-02-15 VITALS — BP 134/82 | HR 104 | Ht 73.0 in | Wt 220.0 lb

## 2024-02-15 DIAGNOSIS — J189 Pneumonia, unspecified organism: Secondary | ICD-10-CM

## 2024-02-15 MED ORDER — HYDROCODONE BIT-HOMATROP MBR 5-1.5 MG/5ML PO SOLN: 5.0000 mL | Freq: Three times a day (TID) | ORAL | 0 refills | Status: DC | PRN

## 2024-02-15 MED ORDER — BENZONATATE 200 MG PO CAPS: 200.0000 mg | ORAL_CAPSULE | Freq: Three times a day (TID) | ORAL | 0 refills | Status: DC | PRN

## 2024-02-15 MED ORDER — PREDNISONE 20 MG PO TABS: 20.0000 mg | ORAL_TABLET | Freq: Two times a day (BID) | ORAL | 0 refills | Status: AC

## 2024-02-15 NOTE — Assessment & Plan Note (Signed)
 Finish course of azithromycin .  Adding steroid burst as well. Hydromet cough syrup and tessalon  perles as needed.  Encouraged to remain well hydrated.  Red flags reviewed.  F/u if worsening or fails to resolve.

## 2024-02-15 NOTE — Patient Instructions (Signed)

## 2024-02-15 NOTE — Progress Notes (Signed)
 " Harold Edwards - 69 y.o. male MRN [id number]  Date of birth: [birth year]  Subjective Chief Complaint  Patient presents with   Hospitalization Follow-up    HPI Harold Edwards is a 69 y.o. male here today for hospital follow up.   He was seen in the ED on 02/12/24 with complaint of flulike symptoms incuding cough, congestion, fatigue.  Negative for COVID, Flu and RSV.  Mild leukocytosis and CXR with left basilar density.  Treated with Azithromycin  for pneumonia.  Reports that he continues to have harsh cough.  He denies fever or chills. He is having back pain which is chronic but exacerbated by his cough.  He has not been using anything OTC for cough.    ROS:  A comprehensive ROS was completed and negative except as noted per HPI  Allergies[1]  Past Medical History:  Diagnosis Date   Back pain, chronic    Cancer (HCC)    Chronic cough 09/19/2017   High cholesterol    Influenza    Pneumonia    Prostate cancer (HCC) 02/21/2014   4+4=8 s/p TRUS biopsy by Dr. Shiela on 01/18/14  Last Assessment & Plan:   Patient be managed by urology.  Patient is status post prostatectomy and radiation therapy.  Denies hematuria.   Sinus tachycardia 09/13/2017   Stress incontinence 04/10/2014    Past Surgical History:  Procedure Laterality Date   ABDOMINAL SURGERY     PROSTATE SURGERY     Prostatectomy robot. Dr Saturnino and Dr Tanda   TONSILLECTOMY      Social History   Socioeconomic History   Marital status: Married    Spouse name: Not on file   Number of children: Not on file   Years of education: Not on file   Highest education level: 12th grade  Occupational History   Not on file  Tobacco Use   Smoking status: Former    Current packs/day: 0.00    Average packs/day: 1 pack/day for 14.6 years (14.6 ttl pk-yrs)    Types: Cigarettes    Start date: 02/08/1974    Quit date: 10/02/1988    Years since quitting: 35.3   Smokeless tobacco: Never  Vaping Use   Vaping status: Never Used   Substance and Sexual Activity   Alcohol use: Yes    Comment: rarely   Drug use: Never   Sexual activity: Not Currently    Birth control/protection: None  Other Topics Concern   Not on file  Social History Narrative   Not on file   Social Drivers of Health   Tobacco Use: Medium Risk (02/15/2024)   Patient History    Smoking Tobacco Use: Former    Smokeless Tobacco Use: Never    Passive Exposure: Not on file  Financial Resource Strain: Low Risk (10/31/2023)   Received from [facility]   Overall Financial Resource Strain (CARDIA)    How hard is it for you to pay for the very basics like food, housing, medical care, and heating?: Not hard at all  Food Insecurity: No Food Insecurity (10/31/2023)   Received [facility]   Epic    Within the past 12 months, you worried that your food would run out before you got the money to buy more.: Never true    Within the past 12 months, the food you bought just didn't last and you didn't have money to get more.: Never true  Transportation Needs: No Transportation Needs (10/31/2023)   Received from [facility]  Epic    In the past 12 months, has lack of transportation kept you from medical appointments or from getting medications?: No    In the past 12 months, has lack of transportation kept you from meetings, work, or from getting things needed for daily living?: No  Physical Activity: Unknown (10/31/2023)   Received from [facility]   Exercise Vital Sign    On average, how many days per week do you engage in moderate to strenuous exercise (like a brisk walk)?: Patient declined    Minutes of Exercise per Session: Not on file  Recent Concern: Physical Activity - Insufficiently Active (09/29/2023)   Exercise Vital Sign    Days of Exercise per Week: 2 days    Minutes of Exercise per Session: 20 min  Stress: No Stress Concern Present (10/31/2023)   Received from [facility]  Stress Questionnaire    Do you feel stress - tense, restless, nervous, or anxious, or unable to sleep at night because your mind is troubled all the time - these days?: Not at all  Social Connections: Patient Declined (10/31/2023)   Received from [facility]   Social Network    How would you rate your social network (family, work, friends)?: Patient declined  Recent Concern: Social Connections - Moderately Isolated (09/29/2023)   Social Connection and Isolation Panel    Frequency of Communication with Friends and Family: Twice a week    Frequency of Social Gatherings with Friends and Family: Twice a week    Attends Religious Services: Never    Database Administrator or Organizations: No    Attends Engineer, Structural: Not on file    Marital Status: Married  Depression (PHQ2-9): Low Risk (01/25/2023)   Depression (PHQ2-9)    PHQ-2 Score: 0  Alcohol Screen: Low Risk (08/23/2022)   Alcohol Screen    Last Alcohol Screening Score (AUDIT): 2  Housing: Unknown (10/31/2023)   Received from [facility]    In the last 12 months, was there a time when you were not able to pay the mortgage or rent on time?: No    Number of Times Moved in the Last Year: Not on file    At any time in the past 12 months, were you homeless or living in a shelter (including now)?: No  Utilities: Not At Risk (10/31/2023)   Received from [facility]    In the past 12 months has the electric, gas, oil, or water company threatened to shut off services in your home?: No  Health Literacy: Not on file    Family History  Problem Relation Age of Onset   Breast cancer Other    Diabetes Other    Cancer Mother    Diabetes Mother    Cancer Father    Diabetes Father     Health Maintenance  Topic Date Due   Colonoscopy  Never done   Medicare Annual Wellness (AWV)  01/25/2024   Influenza Vaccine  05/08/2024 (Originally 09/09/2023)   DTaP/Tdap/Td (1 - Tdap) 02/14/2025 (Originally 10/03/1975)    Pneumococcal Vaccine: 50+ Years  Completed   Hepatitis C Screening  Completed   Meningococcal B Vaccine  Aged Out   COVID-19 Vaccine  Discontinued   Zoster Vaccines- Shingrix  Discontinued     ----------------------------------------------------------------------------------------------------------------------------------------------------------------------------------------------------------------- Physical Exam BP 134/82 (BP Location: Left Arm, Patient Position: Sitting, Cuff Size: Large)   Pulse (!) 104  Ht 6' 1 (1.854 m)   Wt 220 lb (99.8 kg)   SpO2 98%   BMI 29.03 kg/m   Physical Exam Constitutional:      Appearance: Normal appearance.  HENT:     Head: Normocephalic and atraumatic.  Eyes:     General: No scleral icterus. Cardiovascular:     Rate and Rhythm: Normal rate and regular rhythm.  Pulmonary:     Effort: Pulmonary effort is normal.     Breath sounds: Normal breath sounds.  Musculoskeletal:     Cervical back: Neck supple.  Neurological:     Mental Status: He is alert.  Psychiatric:        Mood and Affect: Mood normal.        Behavior: Behavior normal.     ------------------------------------------------------------------------------------------------------------------------------------------------------------------------------------------------------------------- Assessment and Plan  Pneumonia due to infectious organism Finish course of azithromycin .  Adding steroid burst as well. Hydromet cough syrup and tessalon  perles as needed.  Encouraged to remain well hydrated.  Red flags reviewed.  F/u if worsening or fails to resolve.    Meds ordered this encounter  Medications   benzonatate  (TESSALON ) 200 MG capsule    Sig: Take 1 capsule (200 mg total) by mouth 3 (three) times daily as needed for cough.    Dispense:  30 capsule    Refill:  0   HYDROcodone  bit-homatropine (HYCODAN) 5-1.5 MG/5ML syrup    Sig: Take 5 mLs by mouth every 8 (eight) hours as  needed for cough.    Dispense:  150 mL    Refill:  0   predniSONE  (DELTASONE ) 20 MG tablet    Sig: Take 1 tablet (20 mg total) by mouth 2 (two) times daily with a meal for 5 days.    Dispense:  10 tablet    Refill:  0    No follow-ups on file.        [1]  Allergies Allergen Reactions   Clavulanic Acid    Amoxicillin Diarrhea    Stomach cramps   "

## 2024-02-29 ENCOUNTER — Ambulatory Visit: Payer: Self-pay | Admitting: *Deleted

## 2024-02-29 ENCOUNTER — Ambulatory Visit: Admitting: Family Medicine

## 2024-02-29 ENCOUNTER — Encounter: Payer: Self-pay | Admitting: Family Medicine

## 2024-02-29 VITALS — BP 117/80 | HR 110 | Temp 97.7°F | Ht 73.0 in | Wt 222.0 lb

## 2024-02-29 DIAGNOSIS — J189 Pneumonia, unspecified organism: Secondary | ICD-10-CM

## 2024-02-29 MED ORDER — CEFDINIR 300 MG PO CAPS: 300.0000 mg | ORAL_CAPSULE | Freq: Two times a day (BID) | ORAL | 0 refills | Status: DC

## 2024-02-29 NOTE — Telephone Encounter (Signed)
 Patient shows scheduled today 02/29/2024 @ 11:30 with Dr. Alvia

## 2024-02-29 NOTE — Telephone Encounter (Signed)
" °  FYI Only or Action Required?: FYI only for provider: appointment scheduled on 02/29/24.  Patient was last seen in primary care on 02/15/2024 by Alvia Bring, DO.  Called Nurse Triage reporting Cough.  Symptoms began several weeks ago.  Interventions attempted: Prescription medications: antibiotics, tessalon  cough medication and Rest, hydration, or home remedies.  Symptoms are: gradually worsening.  Triage Disposition: See Physician Within 24 Hours 4- 24 hours  Patient/caregiver understands and will follow disposition?: Yes                 Reason for Disposition  [1] Continuous (nonstop) coughing interferes with work or school AND [2] no improvement using cough treatment per Care Advice  Answer Assessment - Initial Assessment Questions Appt scheduled today with other provider none available with PCP until next week. Patient recently treated for pneumonia and sx continued x 2 weeks even after taking antibiotics.     1. ONSET: When did the cough begin?      On going x 2 weeks and sounds deeper and like bark 2. SEVERITY: How bad is the cough today?      Cough worsening and deeper 3. SPUTUM: Describe the color of your sputum (e.g., none, dry cough; clear, white, yellow, green)     green 4. HEMOPTYSIS: Are you coughing up any blood? If Yes, ask: How much? (e.g., flecks, streaks, tablespoons, etc.)     na 5. DIFFICULTY BREATHING: Are you having difficulty breathing? If Yes, ask: How bad is it? (e.g., mild, moderate, severe)      Denies  6. FEVER: Do you have a fever? If Yes, ask: What is your temperature, how was it measured, and when did it start?     na 7. CARDIAC HISTORY: Do you have any history of heart disease? (e.g., heart attack, congestive heart failure)      na 8. LUNG HISTORY: Do you have any history of lung disease?  (e.g., pulmonary embolus, asthma, emphysema)     Hx pneumonia  9. PE RISK FACTORS: Do you have a history of blood  clots? (or: recent major surgery, recent prolonged travel, bedridden)     na 10. OTHER SYMPTOMS: Do you have any other symptoms? (e.g., runny nose, wheezing, chest pain)       Cough deep barker , not sleeping , no chest pain no difficulty breathing no fever reported. Not getting better after antibiotics, still coughing up green mucus 11. PREGNANCY: Is there any chance you are pregnant? When was your last menstrual period?       na 12. TRAVEL: Have you traveled out of the country in the last month? (e.g., travel history, exposures)       na  Protocols used: Cough - Acute Productive-A-AH  "

## 2024-02-29 NOTE — Progress Notes (Signed)
 " Harold Edwards - 69 y.o. male MRN [id number]  Date of birth: May 31, 1955  Subjective Chief Complaint  Patient presents with   URI    HPI Harold Edwards is a 69 year old male here today with complaint of cough, congestion as well as pain in his lower chest with cough.  He was diagnosed with pneumonia earlier this month which was treated with azithromycin .  His symptoms did improve for 1-1/2-2 weeks and then he returned to how he felt before.  He does feel little fatigued.  He denies dyspnea or wheezing.  No fever at this time.  Not really using anything over-the-counter at this time.  ROS:  A comprehensive ROS was completed and negative except as noted per HPI  Allergies[1]  Past Medical History:  Diagnosis Date   Back pain, chronic    Cancer (HCC)    Chronic cough 09/19/2017   High cholesterol    Influenza    Pneumonia    Prostate cancer (HCC) 02/21/2014   4+4=8 s/p TRUS biopsy by Dr. Shiela on 01/18/14  Last Assessment & Plan:   Patient be managed by urology.  Patient is status post prostatectomy and radiation therapy.  Denies hematuria.   Sinus tachycardia 09/13/2017   Stress incontinence 04/10/2014    Past Surgical History:  Procedure Laterality Date   ABDOMINAL SURGERY     PROSTATE SURGERY     Prostatectomy robot. Dr Saturnino and Dr Tanda   TONSILLECTOMY      Social History   Socioeconomic History   Marital status: Married    Spouse name: Not on file   Number of children: Not on file   Years of education: Not on file   Highest education level: 12th grade  Occupational History   Not on file  Tobacco Use   Smoking status: Former    Current packs/day: 0.00    Average packs/day: 1 pack/day for 14.6 years (14.6 ttl pk-yrs)    Types: Cigarettes    Start date: 02/08/1974    Quit date: 10/02/1988    Years since quitting: 35.4   Smokeless tobacco: Never  Vaping Use   Vaping status: Never Used  Substance and Sexual Activity   Alcohol use: Yes    Comment: rarely   Drug  use: Never   Sexual activity: Not Currently    Birth control/protection: None  Other Topics Concern   Not on file  Social History Narrative   Not on file   Social Drivers of Health   Tobacco Use: Medium Risk (02/15/2024)   Patient History    Smoking Tobacco Use: Former    Smokeless Tobacco Use: Never    Passive Exposure: Not on file  Financial Resource Strain: Low Risk (10/31/2023)   Received from [facility]   Overall Financial Resource Strain (CARDIA)    How hard is it for you to pay for the very basics like food, housing, medical care, and heating?: Not hard at all  Food Insecurity: No Food Insecurity (10/31/2023)   Received from [facility]   Epic    Within the past 12 months, you worried that your food would run out before you got the money to buy more.: Never true    Within the past 12 months, the food you bought just didn't last and you didn't have money to get more.: Never true  Transportation Needs: No Transportation Needs (10/31/2023)   Received from [facility]    In the past 12 months, has lack of transportation kept  you from medical appointments or from getting medications?: No    In the past 12 months, has lack of transportation kept you from meetings, work, or from getting things needed for daily living?: No  Physical Activity: Unknown (10/31/2023)   Received from [facility]   Exercise Vital Sign    On average, how many days per week do you engage in moderate to strenuous exercise (like a brisk walk)?: Patient declined    Minutes of Exercise per Session: Not on file  Recent Concern: Physical Activity - Insufficiently Active (09/29/2023)   Exercise Vital Sign    Days of Exercise per Week: 2 days    Minutes of Exercise per Session: 20 min  Stress: No Stress Concern Present (10/31/2023)   Received from [facility] - Occupational Stress Questionnaire    Do you feel stress - tense, restless, nervous, or anxious,  or unable to sleep at night because your mind is troubled all the time - these days?: Not at all  Social Connections: Unknown (02/29/2024)   Social Connection and Isolation Panel    Frequency of Communication with Friends and Family: Three times a week    Frequency of Social Gatherings with Friends and Family: Not on file    Attends Religious Services: Not on file    Active Member of Clubs or Organizations: No    Attends Banker Meetings: Not on file    Marital Status: Not on file  Depression (PHQ2-9): Low Risk (02/29/2024)   Depression (PHQ2-9)    PHQ-2 Score: 0  Alcohol Screen: Low Risk (08/23/2022)   Alcohol Screen    Last Alcohol Screening Score (AUDIT): 2  Housing: Unknown (10/31/2023)   Received from [facility]    In the last 12 months, was there a time when you were not able to pay the mortgage or rent on time?: No    Number of Times Moved in the Last Year: Not on file    At any time in the past 12 months, were you homeless or living in a shelter (including now)?: No  Utilities: Not At Risk (10/31/2023)   Received from [facility]    In the past 12 months has the electric, gas, oil, or water company threatened to shut off services in your home?: No  Health Literacy: Not on file    Family History  Problem Relation Age of Onset   Breast cancer Other    Diabetes Other    Cancer Mother    Diabetes Mother    Cancer Father    Diabetes Father     Health Maintenance  Topic Date Due   Colonoscopy  Never done   Medicare Annual Wellness (AWV)  01/25/2024   Influenza Vaccine  05/08/2024 (Originally 09/09/2023)   DTaP/Tdap/Td (1 - Tdap) 02/14/2025 (Originally 10/03/1975)   Pneumococcal Vaccine: 50+ Years  Completed   Hepatitis C Screening  Completed   Meningococcal B Vaccine  Aged Out   COVID-19 Vaccine  Discontinued   Zoster Vaccines- Shingrix  Discontinued      ----------------------------------------------------------------------------------------------------------------------------------------------------------------------------------------------------------------- Physical Exam BP 117/80 (BP Location: Left Arm, Patient Position: Sitting, Cuff Size: Normal)   Pulse (!) 110   Temp 97.7 F (36.5 C) (Temporal)   Ht 6' 1 (1.854 m)   Wt 222 lb (100.7 kg)   SpO2 96%   BMI 29.29 kg/m   Physical Exam Constitutional:      Appearance: Normal appearance.  HENT:     Head: Normocephalic and atraumatic.  Eyes:     General: No scleral icterus. Cardiovascular:     Rate and Rhythm: Normal rate and regular rhythm.  Pulmonary:     Effort: Pulmonary effort is normal.     Breath sounds: Normal breath sounds.  Musculoskeletal:     Cervical back: Neck supple.  Neurological:     Mental Status: He is alert.  Psychiatric:        Mood and Affect: Mood normal.        Behavior: Behavior normal.     ------------------------------------------------------------------------------------------------------------------------------------------------------------------------------------------------------------------- Assessment and Plan  Pneumonia due to infectious organism Does not sound his previous pneumonia has fully resolved.  Adding course of cefdinir  as he recently completed course of azithromycin .  Red flags reviewed.  May use over-the-counter cough medication if needed.  Encouraged supportive care with increase fluids.   Meds ordered this encounter  Medications   cefdinir  (OMNICEF ) 300 MG capsule    Sig: Take 1 capsule (300 mg total) by mouth 2 (two) times daily.    Dispense:  20 capsule    Refill:  0    No follow-ups on file.        [1]  Allergies Allergen Reactions   Clavulanic Acid    Amoxicillin Diarrhea    Stomach cramps   "

## 2024-02-29 NOTE — Assessment & Plan Note (Signed)
 Does not sound his previous pneumonia has fully resolved.  Adding course of cefdinir  as he recently completed course of azithromycin .  Red flags reviewed.  May use over-the-counter cough medication if needed.  Encouraged supportive care with increase fluids.

## 2024-03-05 ENCOUNTER — Encounter: Payer: Self-pay | Admitting: Physician Assistant

## 2024-03-05 DIAGNOSIS — J329 Chronic sinusitis, unspecified: Secondary | ICD-10-CM

## 2024-03-05 MED ORDER — HYDROCODONE BIT-HOMATROP MBR 5-1.5 MG/5ML PO SOLN: 5.0000 mL | Freq: Three times a day (TID) | ORAL | 0 refills | Status: AC | PRN

## 2024-03-14 ENCOUNTER — Encounter: Payer: Self-pay | Admitting: Family Medicine

## 2024-03-14 MED ORDER — BENZONATATE 200 MG PO CAPS: 200.0000 mg | ORAL_CAPSULE | Freq: Two times a day (BID) | ORAL | 1 refills | Status: AC | PRN
